# Patient Record
Sex: Female | Born: 1950 | Race: Black or African American | Hispanic: No | Marital: Married | Smoking: Never smoker
Health system: Southern US, Community
[De-identification: ages and names within clinical notes are randomized; demographics above are authoritative.]

## PROBLEM LIST (undated history)

## (undated) DIAGNOSIS — G4761 Periodic limb movement disorder: Secondary | ICD-10-CM

## (undated) DIAGNOSIS — Z972 Presence of dental prosthetic device (complete) (partial): Secondary | ICD-10-CM

## (undated) DIAGNOSIS — K219 Gastro-esophageal reflux disease without esophagitis: Secondary | ICD-10-CM

## (undated) DIAGNOSIS — M1990 Unspecified osteoarthritis, unspecified site: Secondary | ICD-10-CM

## (undated) DIAGNOSIS — I10 Essential (primary) hypertension: Secondary | ICD-10-CM

## (undated) DIAGNOSIS — Z9889 Other specified postprocedural states: Secondary | ICD-10-CM

## (undated) DIAGNOSIS — L930 Discoid lupus erythematosus: Secondary | ICD-10-CM

## (undated) DIAGNOSIS — G4730 Sleep apnea, unspecified: Secondary | ICD-10-CM

## (undated) DIAGNOSIS — G43909 Migraine, unspecified, not intractable, without status migrainosus: Secondary | ICD-10-CM

## (undated) DIAGNOSIS — R569 Unspecified convulsions: Secondary | ICD-10-CM

## (undated) DIAGNOSIS — N6019 Diffuse cystic mastopathy of unspecified breast: Secondary | ICD-10-CM

## (undated) DIAGNOSIS — T7840XA Allergy, unspecified, initial encounter: Secondary | ICD-10-CM

## (undated) DIAGNOSIS — I671 Cerebral aneurysm, nonruptured: Secondary | ICD-10-CM

## (undated) DIAGNOSIS — IMO0002 Reserved for concepts with insufficient information to code with codable children: Secondary | ICD-10-CM

## (undated) DIAGNOSIS — E876 Hypokalemia: Secondary | ICD-10-CM

## (undated) DIAGNOSIS — D496 Neoplasm of unspecified behavior of brain: Secondary | ICD-10-CM

## (undated) HISTORY — DX: Other specified postprocedural states: Z98.890

## (undated) HISTORY — PX: HEMORRHOID SURGERY: SHX153

## (undated) HISTORY — DX: Unspecified convulsions: R56.9

## (undated) HISTORY — PX: TONSILLECTOMY AND ADENOIDECTOMY: SUR1326

## (undated) HISTORY — DX: Allergy, unspecified, initial encounter: T78.40XA

## (undated) HISTORY — DX: Migraine, unspecified, not intractable, without status migrainosus: G43.909

## (undated) HISTORY — PX: FOOT SURGERY: SHX648

## (undated) HISTORY — PX: HERNIA REPAIR: SHX51

## (undated) HISTORY — DX: Periodic limb movement disorder: G47.61

## (undated) HISTORY — PX: BRAIN AVM REPAIR: SHX202

## (undated) HISTORY — DX: Essential (primary) hypertension: I10

## (undated) HISTORY — DX: Sleep apnea, unspecified: G47.30

## (undated) HISTORY — DX: Diffuse cystic mastopathy of unspecified breast: N60.19

## (undated) HISTORY — DX: Reserved for concepts with insufficient information to code with codable children: IMO0002

## (undated) HISTORY — PX: SPINE SURGERY: SHX786

## (undated) HISTORY — DX: Neoplasm of unspecified behavior of brain: D49.6

## (undated) HISTORY — DX: Discoid lupus erythematosus: L93.0

## (undated) HISTORY — PX: DILATION AND CURETTAGE OF UTERUS: SHX78

## (undated) HISTORY — DX: Cerebral aneurysm, nonruptured: I67.1

## (undated) HISTORY — DX: Gastro-esophageal reflux disease without esophagitis: K21.9

## (undated) HISTORY — PX: BREAST SURGERY: SHX581

## (undated) HISTORY — PX: BREAST CYST ASPIRATION: SHX578

## (undated) HISTORY — PX: BREAST EXCISIONAL BIOPSY: SUR124

## (undated) HISTORY — DX: Hypokalemia: E87.6

## (undated) HISTORY — PX: BREAST BIOPSY: SHX20

## (undated) SURGERY — COLONOSCOPY WITH PROPOFOL
Anesthesia: Choice

---

## 1977-01-04 HISTORY — PX: TOTAL ABDOMINAL HYSTERECTOMY: SHX209

## 1998-01-04 HISTORY — PX: OTHER SURGICAL HISTORY: SHX169

## 1998-11-21 ENCOUNTER — Encounter: Payer: Self-pay | Admitting: Neurosurgery

## 1998-11-21 ENCOUNTER — Ambulatory Visit (HOSPITAL_COMMUNITY): Admission: RE | Admit: 1998-11-21 | Discharge: 1998-11-22 | Payer: Self-pay | Admitting: Neurosurgery

## 2001-06-22 DIAGNOSIS — D1802 Hemangioma of intracranial structures: Secondary | ICD-10-CM | POA: Insufficient documentation

## 2001-06-25 DIAGNOSIS — Q283 Other malformations of cerebral vessels: Secondary | ICD-10-CM | POA: Insufficient documentation

## 2001-09-20 ENCOUNTER — Encounter: Payer: Self-pay | Admitting: Neurosurgery

## 2001-09-25 ENCOUNTER — Ambulatory Visit (HOSPITAL_COMMUNITY): Admission: RE | Admit: 2001-09-25 | Discharge: 2001-09-26 | Payer: Self-pay | Admitting: Neurosurgery

## 2001-09-25 ENCOUNTER — Encounter: Payer: Self-pay | Admitting: Neurosurgery

## 2003-02-08 ENCOUNTER — Encounter: Admission: RE | Admit: 2003-02-08 | Discharge: 2003-02-08 | Payer: Self-pay | Admitting: Neurosurgery

## 2003-02-26 ENCOUNTER — Encounter: Admission: RE | Admit: 2003-02-26 | Discharge: 2003-02-26 | Payer: Self-pay | Admitting: Neurosurgery

## 2003-03-20 ENCOUNTER — Encounter: Admission: RE | Admit: 2003-03-20 | Discharge: 2003-03-20 | Payer: Self-pay | Admitting: Neurosurgery

## 2004-02-03 ENCOUNTER — Ambulatory Visit: Payer: Self-pay | Admitting: Surgery

## 2004-02-14 ENCOUNTER — Ambulatory Visit: Payer: Self-pay | Admitting: Surgery

## 2004-03-03 ENCOUNTER — Ambulatory Visit: Payer: Self-pay | Admitting: Surgery

## 2004-05-25 ENCOUNTER — Ambulatory Visit: Payer: Self-pay | Admitting: Surgery

## 2004-06-11 ENCOUNTER — Ambulatory Visit: Payer: Self-pay | Admitting: Surgery

## 2004-11-18 ENCOUNTER — Ambulatory Visit: Payer: Self-pay | Admitting: Surgery

## 2005-02-03 ENCOUNTER — Ambulatory Visit: Payer: Self-pay | Admitting: Surgery

## 2005-12-01 ENCOUNTER — Ambulatory Visit: Payer: Self-pay | Admitting: Gastroenterology

## 2006-03-30 ENCOUNTER — Ambulatory Visit: Payer: Self-pay | Admitting: Surgery

## 2006-05-04 ENCOUNTER — Ambulatory Visit: Payer: Self-pay | Admitting: Neurology

## 2007-03-31 ENCOUNTER — Ambulatory Visit: Payer: Self-pay | Admitting: Surgery

## 2007-06-01 DIAGNOSIS — E876 Hypokalemia: Secondary | ICD-10-CM

## 2007-06-01 HISTORY — DX: Hypokalemia: E87.6

## 2007-10-30 ENCOUNTER — Ambulatory Visit: Payer: Self-pay | Admitting: Family Medicine

## 2007-11-07 ENCOUNTER — Ambulatory Visit: Payer: Self-pay | Admitting: Family Medicine

## 2008-05-14 ENCOUNTER — Ambulatory Visit: Payer: Self-pay | Admitting: Family Medicine

## 2008-10-30 ENCOUNTER — Ambulatory Visit: Payer: Self-pay | Admitting: Surgery

## 2008-11-07 ENCOUNTER — Ambulatory Visit: Payer: Self-pay | Admitting: Surgery

## 2009-03-10 ENCOUNTER — Encounter: Payer: Self-pay | Admitting: Cardiovascular Disease

## 2009-03-20 ENCOUNTER — Ambulatory Visit: Payer: Self-pay | Admitting: Cardiovascular Disease

## 2009-03-20 DIAGNOSIS — R079 Chest pain, unspecified: Secondary | ICD-10-CM | POA: Insufficient documentation

## 2009-03-20 DIAGNOSIS — R7309 Other abnormal glucose: Secondary | ICD-10-CM | POA: Insufficient documentation

## 2009-03-20 DIAGNOSIS — I10 Essential (primary) hypertension: Secondary | ICD-10-CM | POA: Insufficient documentation

## 2009-03-20 DIAGNOSIS — G4769 Other sleep related movement disorders: Secondary | ICD-10-CM | POA: Insufficient documentation

## 2009-04-29 ENCOUNTER — Telehealth: Payer: Self-pay | Admitting: Cardiovascular Disease

## 2009-05-06 ENCOUNTER — Telehealth: Payer: Self-pay | Admitting: Cardiovascular Disease

## 2009-05-09 ENCOUNTER — Ambulatory Visit: Payer: Self-pay | Admitting: Surgery

## 2009-05-15 ENCOUNTER — Encounter: Payer: Self-pay | Admitting: Cardiovascular Disease

## 2009-05-15 ENCOUNTER — Ambulatory Visit: Payer: Self-pay

## 2009-05-20 ENCOUNTER — Encounter: Payer: Self-pay | Admitting: Cardiovascular Disease

## 2009-05-21 ENCOUNTER — Ambulatory Visit: Payer: Self-pay | Admitting: Cardiovascular Disease

## 2009-05-21 DIAGNOSIS — R609 Edema, unspecified: Secondary | ICD-10-CM | POA: Insufficient documentation

## 2009-06-06 ENCOUNTER — Ambulatory Visit: Payer: Self-pay | Admitting: Internal Medicine

## 2009-06-11 ENCOUNTER — Ambulatory Visit: Payer: Self-pay | Admitting: Family Medicine

## 2009-06-16 ENCOUNTER — Telehealth: Payer: Self-pay | Admitting: Cardiovascular Disease

## 2009-07-04 ENCOUNTER — Ambulatory Visit: Payer: Self-pay | Admitting: Cardiovascular Disease

## 2009-07-08 ENCOUNTER — Encounter: Payer: Self-pay | Admitting: Cardiovascular Disease

## 2009-07-08 LAB — CONVERTED CEMR LAB
BUN: 14 mg/dL (ref 6–23)
Chloride: 100 meq/L (ref 96–112)
Potassium: 3.2 meq/L — ABNORMAL LOW (ref 3.5–5.3)
Sodium: 140 meq/L (ref 135–145)

## 2009-07-21 ENCOUNTER — Ambulatory Visit: Payer: Self-pay | Admitting: Cardiovascular Disease

## 2009-07-31 ENCOUNTER — Telehealth: Payer: Self-pay | Admitting: Cardiovascular Disease

## 2009-11-04 ENCOUNTER — Ambulatory Visit: Payer: Self-pay | Admitting: Surgery

## 2010-02-03 NOTE — Miscellaneous (Signed)
Summary: Lasix, potassium  Clinical Lists Changes  Medications: Changed medication from POTASSIUM CHLORIDE CRYS CR 20 MEQ CR-TABS (POTASSIUM CHLORIDE CRYS CR) Take one tablet by mouth daily to POTASSIUM CHLORIDE CRYS CR 20 MEQ CR-TABS (POTASSIUM CHLORIDE CRYS CR) Take two tablets by mouth once daily - Signed Changed medication from FUROSEMIDE 20 MG TABS (FUROSEMIDE) Take 1 tablet by mouth once a day as needed swelling to FUROSEMIDE 40 MG TABS (FUROSEMIDE) Take one tablet by mouth daily. - Signed Rx of POTASSIUM CHLORIDE CRYS CR 20 MEQ CR-TABS (POTASSIUM CHLORIDE CRYS CR) Take two tablets by mouth once daily;  #60 x 6;  Signed;  Entered by: Benedict Needy, RN;  Authorized by: Dossie Arbour MD;  Method [facility]: [phone][facility] [address] [place], [place], [place]  [address], Ph: [phone] or [phone], Fax: [phone] Rx of FUROSEMIDE 40 MG TABS (FUROSEMIDE) Take one tablet by mouth daily.;  #30 x 6;  Signed;  Entered by: Benedict Needy, RN;  Authorized by: Dossie Arbour MD;  Method used: [facility] [phone].*, [address] [place], [place], [place]  [address], Ph: [phone] or [phone], Fax: [phone]    Prescriptions: FUROSEMIDE 40 MG TABS (FUROSEMIDE) Take one tablet by mouth daily.  #30 x 6   Entered by:   Benedict Needy, RN   Authorized by:   Dossie Arbour MD   Signed by:   Benedict Needy, RN on 07/08/2009   Method used:   [facility]        [facility]  [address] [phone]ail[facility]       [address]       [place], [place]  [address]       Ph: [phone] or [phone]       Fax: [phone]   RxID:   [id number] POTASSIUM CHLORIDE CRYS CR 20 MEQ CR-TABS (POTASSIUM CHLORIDE CRYS CR) Take two tablets by mouth once daily  #60 x 6   Entered by:   Benedict Needy, RN   Authorized by:   Dossie Arbour MD   Signed by:   Benedict Needy, RN on 07/08/2009   Method used:   [facility]        [facility]  [address] [phone].*  (retail)       [address]       [place], [place]  [address]       Ph: [phone] or [phone]       Fax: [phone]   RxID:   [id number]

## 2010-02-03 NOTE — Progress Notes (Signed)
Summary: EDEMA  Phone Note Call from Patient Call back at [phone]   Caller: SELF Call For: [facility] Summary of Call: PT IS HAVING SWELLING IN FEET AND WOULD LIKE A CALL BACK Initial call taken by: Harlon Flor,  May 06, 2009 8:07 AM  Follow-up for Phone Call        Called spoke with pt.  Pt states she is still experiencing B leg swelling throughout day worsens, better with elevation through the night.  Pt states she feels bloted.  Pt states CP is better, since last week.  Not having incr SOB.  Saw primary MD last week,  gave PPI for stomach without relief.  Wants to know if Dr Mariah Milling would like to see pt?   Follow-up by: Cloyde Reams RN,  May 06, 2009 10:32 AM

## 2010-02-03 NOTE — Assessment & Plan Note (Signed)
Summary: f/u after ECHO/Edema/ewj   Visit Type:  Follow-up Primary Provider:  Nilda Simmer  CC:  FEELS LIKE SHE IS RETAINING FLUID/ ONCE IN A WHILE HAVING SOME CHEST PAINS/ SOME SHORTNESS OF BREATH/ EDEMA IN ANKLES AND FEET EXPECIALLY IN THE EVENING.[name]  History of Present Illness: Ms. Bacha is a 60 her old woman with a past medical history of prediabetes, hypertension, migraines who has otherwise been in good state of health who presents via referral from Dr. Katrinka Blazing at [facility] family practice for evaluation after recent episodes of chest tightness.  She states that she has had some days of lower extremity edema. They typically, the end of the day after she's been on her feet. She feels that she is retaining fluid and her HCTZ pill was not helping her to any degree. 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  She continues to have very occasional episode chest pain no typically at rest. She has not been exercising still appeared.  Echo done in the past several weeks shows normal systolic function, no elevated right ventricular systolic pressure, no significant valvular disease.  She is active at baseline, has no symptoms with exertion. She is limited to some degree by some lower extremity discomfort though states that she is able to go shopping and do her ADLs without any problems. She does not participate in a regular exercise program. She has been active since her episode of chest pain and has had no further episodes. She denies any belching or burping that might have relieved her discomfort. She is a nonsmoker, has a history of diabetes though no active diabetes currently.  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  Preventive Screening-Counseling & Management  Alcohol-Tobacco     Smoking Status: never  Caffeine-Diet-Exercise     Does Patient Exercise:  yes      Drug Use:  no.    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 1 Tab Daily 2)  Nortriptyline Hcl 25 Mg Caps (Nortriptyline Hcl) .... 2 Tabs At Bedtime 3)  Gabapentin 800 Mg Tabs (Gabapentin) .[name].. 1 Tab By Mouth 4 Times A Day 4)  Hydroxychloroquine Sulfate 200 Mg Tabs (Hydroxychloroquine Sulfate) .[name].. 1 Tab Two Times A Day 5)  Lortab 7.5-500 Mg Tabs (Hydrocodone-Acetaminophen) .[name].. 1 Tab Every 12 Hours 6)  Potassium Chloride Crys Cr 20 Meq Cr-Tabs (Potassium Chloride Crys Cr) .... Take One Tablet By Mouth Daily 7)  Triamterene-Hctz 37.5-25 Mg Tabs (Triamterene-Hctz) .[name].. 1 Tab By Mouth Daily 8)  Mirapex 0.125 Mg Tabs (Pramipexole Dihydrochloride) .[name].. 1 Tab At Bedtime For Restless Leg Syndrome 9)  Atenolol 25 Mg Tabs (Atenolol) .[name].. 1 Tab Every Morning and 2 At Bedtime 10)  Nitrostat 0.4 Mg Subl (Nitroglycerin) .[name].. 1 Tablet Under Tongue At Onset of Chest Pain; You May Repeat Every 5 Minutes For Up To 3 Doses.  Allergies (verified): 1)  ! * Lisinopril  Past History:  Past Surgical History: Abdominal Hysterectomy-Total 5 d&c HEMORHIOD SURGERY FIBROCYSIC DISEASE 5 SURGERIES  ON BREAST FOOT SURGERY  Family History: Father: deceased 63: killed Mother: deceased 28: heart problems, CHF, HTN Family History of Coronary Artery Disease:  Family History  of Cancer:   Social History: Retired  Married  Tobacco Use - No.  Alcohol Use - no Regular Exercise - yes Drug Use - no Does Patient Exercise:  yes Drug Use:  no  Review of Systems       The patient complains of weight gain and peripheral edema.  The patient denies fever, weight loss, vision loss, decreased hearing, hoarseness, chest pain, syncope, dyspnea on exertion, prolonged  cough, abdominal pain, incontinence, muscle weakness, depression, and enlarged lymph nodes.    Vital Signs:  Patient profile:   60 year old female Height:      62 inches Weight:      186.75 pounds BMI:     34.28 Pulse rate:   72 / minute Pulse rhythm:   regular BP sitting:   110 / 80  (left arm) Cuff size:   large  Vitals Entered By: Mercer Pod (May 21, 2009 11:11 AM)  Physical Exam  General:  well-appearing woman with obesity in no apparent distress, HEENT skin is benign, or fax is clear, neck is supple with no JVP or carotid bruits, heart sounds are regular with S1-S2 and no murmurs appreciated, lungs are clear to auscultation with no wheezes Rales, abdominal exam is benign, no significant lower extremity edema, neurologic exam is nonfocal, skin is warm and dry couple's are equal and symmetrical in her upper and lower extremities.   Impression & Recommendations:  Problem # 1:  EDEMA (ICD-782.3) etiology of her edema is likely due to venous insufficiency. She does not have an elevated right ventricular systolic pressure on echo. She does have some indication of diastolic dysfunction.  We will her a prescription for furosemide 20 mg to be taken on an as-needed basis when she does have significant ankle swelling. If this does not seem to help, suggested she try TED hose.  I suggested it is okay if she needs to take prednisone for her lupus.  Problem # 2:  CHEST PAIN-UNSPECIFIED (ICD-786.50) Her symptoms of chest pain again seem somewhat atypical I have asked her to keep [place] posted if her symptoms get worse and with exertion. She did have a stress test 2 years ago that was normal.  Will try to obtain her most recent cholesterol panel from [name]  Her updated medication list for this problem includes:    Atenolol 25 Mg Tabs (Atenolol) .[name]... 1 tab every morning and 2 at bedtime    Nitrostat 0.4 Mg Subl (Nitroglycerin) .[name]... 1 tablet under tongue at onset of chest  pain; you may repeat every 5 minutes for up to 3 doses.  Appended Document: f/u after ECHO/Edema/ewj    Clinical Lists Changes  Medications: Added new medication of FUROSEMIDE 20 MG TABS (FUROSEMIDE) Take 1 tablet by mouth once a day as needed swelling - Signed Rx of FUROSEMIDE 20 MG TABS (FUROSEMIDE) Take 1 tablet by mouth once a day as needed swelling;  #30 x 3;  Signed;  Entered by: Cloyde Reams RN;  Authorized by: Dossie Arbour MD;  Method used: [facility] [phone].*, [address], [place], [place], [place]  [address], Ph: [phone] or [phone], Fax: [phone]    Prescriptions: FUROSEMIDE 20 MG TABS (FUROSEMIDE) Take 1 tablet by mouth once a day as needed swelling  #30 x 3   Entered by:   Cloyde Reams RN   Authorized by:   Dossie Arbour MD   Signed by:   Cloyde Reams RN on 05/22/2009  Method used:   [facility]        [facility]  [address] [phone][facility] (retail)       [address]       [place], [place]  [address]       Ph: [phone] or [phone]       Fax: [phone]   RxID:   [id number]

## 2010-02-03 NOTE — Assessment & Plan Note (Signed)
Summary: NP6/AMD   Visit Type:  Initial Consult Primary Provider:  Nilda Simmer  CC:  tightness in chest.  History of Present Illness: Maureen Hodge is a 42 her old woman with a past medical history of prediabetes, hypertension, migraines who has otherwise been in good state of health who presents via referral from Dr. Katrinka Blazing at [facility] family practice for evaluation after recent episodes of chest tightness.  She states that 1-1/2 weeks ago, she developed chest pain while sitting on the couch. She described it as a tightness lasting for 30-45 minutes with a 7-8/10 in intensity. She almost went to the emergency room and then it seemed to ease off without any further intervention. She has had episodes like this in the past that has led to stress test. She had a stress test in May 2009 and also a stress test at [facility] several years prior to that. She was told that both of these stress tests were negative and her pain was likely noncardiac.  She is active at baseline, has no symptoms with exertion. She is limited to some degree by some lower extremity discomfort though states that she is able to go shopping and do her ADLs without any problems. She does not participate in a regular exercise program. She has been active since her episode of chest pain and has had no further episodes. She denies any belching or burping that might have relieved her discomfort. She is a nonsmoker, has a history of diabetes though no active diabetes currently.  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  Preventive Screening-Counseling & Management  Alcohol-Tobacco     Alcohol drinks/day: 0     Smoking Status: never  Caffeine-Diet-Exercise     Does Patient Exercise: no  Allergies (verified): 1)  ! * Lisinopril  Past History:  Past Medical History: migraines HTN limb disorder movement sleep related pre-diabetes  Social History: Alcohol  drinks/day:  0 Smoking Status:  never Does Patient Exercise:  no  Review of Systems       The patient complains of chest pain.  The patient denies fever, weight loss, weight gain, vision loss, decreased hearing, hoarseness, syncope, dyspnea on exertion, peripheral edema, prolonged cough, abdominal pain, incontinence, muscle weakness, depression, and enlarged lymph nodes.    Physical Exam  General:  well-appearing middle-aged woman in no apparent distress, HEENT exam is benign, neck is supple with no JVP or carotid bruits, heart sounds are regular with S1-S2 no murmurs appreciated, lungs are clear to auscultation with no wheezes or rales, abdominal exam is benign, no significant lower extremity edema, neurologic exam is grossly nonfocal, skin is warm and dry. Pulses are equal and symmetrical in her upper and lower extremities.    EKG  Procedure date:  03/10/2009  Findings:      EKG done at Dr. Michaelle Copas office shows normal sinus rhythm with rate of 73 beats per minute, no significant ST or T wave changes.  Impression & Recommendations:  Problem # 1:  CHEST PAIN-UNSPECIFIED (ICD-786.50) Etiology of her chest pain is uncertain. She has few risk factors for coronary disease. She has had chest pain previously with negative stress test in 5/09. She is currently active with no further chest pain. We did suggest to her that we could monitor her symptoms at this time if she has further episodes, order a repeat stress test to rule out ischemia. If she is  concerned about her symptoms, we could easily order the stress test now. She preferred to hold off and to see how her symptoms progressed. I told her to start exercising with walking on a track near her house. If She does have any symptoms with exertion, I asked her to call me.  Her symptoms may be gastrointestinal and we will call in nitroglycerin to take p.r.n. if her symptoms are severe. This could help with esophageal spasm as well as coronary  spasm. If She has to take a nitroglycerin, I have asked her to contact our office.   Her updated medication list for this problem includes:    Atenolol 25 Mg Tabs (Atenolol) .[name]... 1 tab every morning and 2 at bedtime    Nitrostat 0.4 Mg Subl (Nitroglycerin) .[name]... 1 tablet under tongue at onset of chest pain; you may repeat every 5 minutes for up to 3 doses.  Problem # 2:  HYPERTENSION, BENIGN (ICD-401.1) blood pressure is borderline elevated on today's visit and we have suggested that she continue to monitor this at home. We have made no medication changes.  Her updated medication list for this problem includes:    Triamterene-hctz 37.5-25 Mg Tabs (Triamterene-hctz) .[name]... 1 tab by mouth daily    Atenolol 25 Mg Tabs (Atenolol) .[name]... 1 tab every morning and 2 at bedtime  Patient Instructions: 1)  Your physician recommends that you schedule a follow-up appointment in: 3 months 2)  Your physician has recommended you make the following change in your medication: nitroglycerin sublingual as needed for chest pain Prescriptions: NITROSTAT 0.4 MG SUBL (NITROGLYCERIN) 1 tablet under tongue at onset of chest pain; you may repeat every 5 minutes for up to 3 doses.  #25 x 1   Entered by:   Charlena Cross, RN, BSN   Authorized by:   Dossie Arbour MD   Signed by:   Charlena Cross, RN, BSN on 03/20/2009   Method used:   [facility]        [facility]  [address] [phone][facility] (retail[facility]       [address]       [place], [place]  [address]       Ph: [phone] or [phone]       Fax: [phone]   RxID:   [id number]

## 2010-02-03 NOTE — Progress Notes (Signed)
Summary: edema  Phone Note Call from Patient   Caller: Patient Reason for Call: Talk to Nurse Summary of Call: Pt is having edema in both legs and is up over 10lbs. She tried TED hose for 3 days with no reduction, she also tried to keep her legs elavated as much as possible. Initial call taken by: Hardin Negus, RMA,  June 16, 2009 4:36 PM  Follow-up for Phone Call        spoke with Dr Excell Seltzer, who is in the office today, he said to tell her to take an extra lasix today and start 40mg  tomorrow along with and extra potassium. .... pt aware Follow-up by: Hardin Negus, RMA,  June 16, 2009 4:39 PM     Appended Document: edema If she is taking lasix daily, we should check her BMP in 1 to 2 weeks. ECHO did not suggest alot of fluid. Edema is likely from veins in legs.   Appended Document: edema LMOM TCB  Appended Document: edema Spoke to pt scheduled lab appointment for 7/1 at 8:45.

## 2010-02-03 NOTE — Progress Notes (Signed)
Summary: PHI  PHI   Imported By: Harlon Flor 03/24/2009 15:40:26  _____________________________________________________________________  External Attachment:    Type:   Image     Comment:   External Document

## 2010-02-03 NOTE — Progress Notes (Signed)
Summary: EDEMA  Phone Note Call from Patient Call back at Home Phone [phone]   Caller: SELF Call For: GOLLAN Summary of Call: ANKLES HAVE BEEN SWELLING-GO BACK DOWN AFTER BEING PROPPED UP-WENT TO THE PRIMARY DR THIS MORNING AND WAS TOLD TO CALL HERE AND TO LET Mariah Milling KNOW BECAUSE THE PT MAY NEED A STRESS TEST Initial call taken by: Harlon Flor,  April 29, 2009 1:38 PM  Follow-up for Phone Call        spoke with pt and pt is having intermittent chest pressure in conjunction with swelling.  pt was evaluated by Dr. Michaelle Copas PA and was sent hom with NTG.  Instructed pt that I would forward this message to Dr. Mariah Milling and that if she has active chest pain she needs to seek medical attention rather than waiting to see PCP.  Pt understands.  Follow-up by: Charlena Cross, RN, BSN,  April 29, 2009 2:12 PM  Additional Follow-up for Phone Call Additional follow up Details #1::        Patient called again this morning and reported swelling still in legs and feet with a increase of 4 pounds.  Patient would like to be seen by Dr. Mariah Milling asap.    Additional Follow-up for Phone Call Additional follow up Details #2::    Would order echo, either [place] or [place] to evaluate cardiac function, ?pulmonary HTn. edema. Can see her after echo done.   Reduce  salt and fluid intake. Monitor BP   Appended Document: EDEMA Attempted TCB and notify pt.  LMOM TCB. EWJ  Appended Document: EDEMA Spoke with pt ECHO scheduled for 05/15/09 and f/u with Dr Mariah Milling 05/21/09.  EWJ

## 2010-02-03 NOTE — Progress Notes (Signed)
Summary: SWELLING  Phone Note Call from Patient Call back at [facility] Phone [phone]   Caller: Patient Summary of Call: PATIENT IS HAVING EDEMA IN LEGS AND ANKLES WOULD LIKE FOR RN TO CALL HER ON HER CELL PHONE [phone] Initial call taken by: [place],  July 31, 2009 9:48 AM  Follow-up for Phone Call        pt is wearing her TEDs and taking lasix the edema in LE is not improving.  Reports that she has decreased her salt intake and does not drink soda. Please advise.  Follow-up by: Benedict Needy, RN,  July 31, 2009 11:25 AM  Additional Follow-up for Phone Call Additional follow up Details #1::        If the lasix and TED hose is not helping, it is likely from leaky veins. She can do nothing and wear TED hose, or can see a vein specialist, possibly one of the vein docs in [place] or [place]. they can do a "vein mapping"      Appended Document: SWELLING LMOM TCB   Appended Document: SWELLING lmom tcb  Appended Document: SWELLING going on vacation next satuday best # to reach her [phone]  would like an appointment next week.   Appended Document: SWELLING pt aware of appointment aug 30th pt given [facility] address and phone #. Demographics, office note and recent labs faxed

## 2010-05-22 NOTE — Op Note (Signed)
NAMELECHELLE, WRIGLEY                        ACCOUNT NO.:  [id number]   MEDICAL RECORD NO.:  [id number]                   PATIENT TYPE:  OIB   LOCATION:  3012                                 FACILITY:  [facility]   PHYSICIAN:  Kathaleen Maser. Pool, M.D.                 DATE OF BIRTH:  August 31, 1950   DATE OF PROCEDURE:  09/25/2001  DATE OF DISCHARGE:                                 OPERATIVE REPORT   PREOPERATIVE DIAGNOSIS:  Left L5-S1 foraminal stenosis with radiculopathy.   POSTOPERATIVE DIAGNOSIS:  Left L5-S1 foraminal stenosis with radiculopathy.   PROCEDURE:  Left L5-S1 decompressive laminotomy with foraminotomy.   SURGEON:  Kathaleen Maser. Pool, M.D.   ASSISTANT:  Reinaldo Meeker, M.D.   ANESTHESIA:  General endotracheal.   INDICATIONS:  The patient is a 60 year old female with history of a previous  lumbar spinal puncture with the post-procedure symptoms of chronic  radiculopathy involving her left-sided L5 nerve root.  The patient has  failed all efforts at conservative management.  Workup along the way has  included an MRI scan of her lumbar spine, which demonstrates significantly  abnormal anatomy on the left side at the L5-S1 level.  The patient has  marked facet hypertrophy with severe foraminal stenosis causing marked  compression of the exiting L5 nerve root.  We have discussed options  available for management, including the possibility of undergoing a left-  sided L5-S1 decompressive laminotomy and foraminotomy in hopes of improving  her symptoms.  The patient is well aware that this surgery may not give her  any pain relief whatsoever and that she may have a permanent nerve injury  for which there is nothing that can be done.  However, given the significant  structural abnormality, I think it is reasonable to try decompression as a  last ditch effort to help ease some of her pain.  The patient is aware of  the risks and benefits and wishes to proceed.   DESCRIPTION OF  PROCEDURE:  The patient was taken to the operating room and  placed on the operating table in a supine position.  After an adequate level  of anesthesia was achieved, the patient was positioned prone onto the Wilson  frame and properly padded.  The patient's lumbar region is prepped and  draped sterilely.  A 10 blade is used to make a linear skin incision  overlying the L5-S1 interspace.  This is carried down sharply in the  midline.  A subperiosteal dissection is performed on the left side, exposing  what was felt to be L5 and S1 on the left.  An x-ray was taken.  In fact,  the L4-5 level had been exposed.  Decompression then proceeded one  interspace lower.  It should be noted that the interspace at L5-S1 on the  left was markedly abnormal.  There was no interlaminar space to speak of.  The facet was very  hypertrophic and abnormally formed.  A complete left-  sided L5 laminectomy and wide facetectomy was then performed using the high-  speed drill and Kerrison rongeurs.  Ligamentum flavum was then elevated and  resected in piecemeal fashion using the Kerrison rongeurs.  Underlying  thecal sac and exiting L5 and S1 nerve roots were identified.  Both nerve  roots were widely decompressed.  An extremely wide decompression then  performed using the microscope for microdissection along the left-sided L5  nerve root.  The facet joint was undercut, as was the ligament, and the  nerve root itself was followed out into the extraforaminal space.  There was  no evidence of injury to the nerve root.  A blunt probe passed easily along  the course of the nerve root.  There was some degree of a pseudo-disk bulge  but no frank disk herniation requiring treatment.  At this point a blunt  probe was passed easily along the course of the exiting L5 and S1 nerve  roots.  The wound was then irrigated with antibiotic solution.  Gelfoam was  placed topically for hemostasis.  The microscope was removed.   Hemostasis in  the muscles was achieved with electrocautery.  The wound was then closed in  layers with Vicryl sutures.  Steri-Strips and sterile dressing were applied.  There were no apparent complications.  The patient tolerated the procedure  well, and she returns to the recovery room postop.                                               Henry A. Pool, M.D.    HAP/MEDQ  D:  09/25/2001  T:  09/26/2001  Job:  [id number]

## 2010-06-10 ENCOUNTER — Ambulatory Visit: Payer: Self-pay | Admitting: Cardiovascular Disease

## 2010-06-12 ENCOUNTER — Encounter: Payer: Self-pay | Admitting: Cardiovascular Disease

## 2010-06-18 ENCOUNTER — Ambulatory Visit: Payer: Self-pay | Admitting: Cardiovascular Disease

## 2010-06-29 ENCOUNTER — Ambulatory Visit: Payer: Self-pay | Admitting: Cardiovascular Disease

## 2010-07-02 ENCOUNTER — Ambulatory Visit (INDEPENDENT_AMBULATORY_CARE_PROVIDER_SITE_OTHER): Payer: 59 | Admitting: Cardiovascular Disease

## 2010-07-02 ENCOUNTER — Encounter: Payer: Self-pay | Admitting: Cardiovascular Disease

## 2010-07-02 DIAGNOSIS — R079 Chest pain, unspecified: Secondary | ICD-10-CM

## 2010-07-02 DIAGNOSIS — R609 Edema, unspecified: Secondary | ICD-10-CM

## 2010-07-02 DIAGNOSIS — I10 Essential (primary) hypertension: Secondary | ICD-10-CM

## 2010-07-02 NOTE — Assessment & Plan Note (Signed)
No episodes of chest pain recently. No further workup at this time. We have encouraged her to continue her exercise

## 2010-07-02 NOTE — Assessment & Plan Note (Signed)
No significant edema on today's visit. She does wear TED hose and leg elevation when she does develop edema

## 2010-07-02 NOTE — Patient Instructions (Signed)
You are doing well. No medication changes were made. Please call us if you have new issues that need to be addressed before your next appt.  We will call you for a follow up Appt. In 12 months  

## 2010-07-02 NOTE — Assessment & Plan Note (Signed)
Blood pressure is well controlled on today's visit. No changes made to the medications. 

## 2010-07-02 NOTE — Progress Notes (Signed)
   Patient ID: Maureen Hodge, female    DOB: Dec 22, 1950, 60 y.o.   MRN: [id number]  HPI Comments: Maureen Hodge is a 60 her old woman with a past medical history of prediabetes, hypertension, migraines who has otherwise been in good state of health, previous history of chest tightness who presents for routine followup. She is a patient of Maureen Hodge.   Overall, she reports that she is doing well. She denies any significant chest pain, no edema. She does have edema in her ankles, she wears TED hose and leg elevation. She reports that her blood pressure has been elevated at times and she is watching it at home at the request of Maureen Hodge.   Echo Hodge in the past  shows normal systolic function, no elevated right ventricular systolic pressure, no significant valvular disease.   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  Total cholesterol 151, LDL 62, HDL 66 last year  she does report her mother had coronary artery disease  EKG shows normal sinus rhythm with rate 63 beats per minute with no significant ST or T wave changes      Review of Systems  Constitutional: Negative.   HENT: Negative.   Eyes: Negative.   Respiratory: Negative.   Cardiovascular: Positive for leg swelling.  Gastrointestinal: Negative.   Musculoskeletal: Negative.   Skin: Negative.   Neurological: Negative.   Hematological: Negative.   Psychiatric/Behavioral: Negative.   All other systems reviewed and are negative.    BP 119/81  Pulse 62  Ht 5\' 1"  (1.549 m)  Wt 179 lb (81.194 kg)  BMI 33.82 kg/m2  Physical Exam  Nursing note and vitals reviewed. Constitutional: She is oriented to person, place, and time. She appears well-developed and well-nourished.  HENT:  Head: Normocephalic.  Nose: Nose normal.  Mouth/Throat: Oropharynx is clear and moist.  Eyes: Conjunctivae are normal. Pupils are equal, round, and reactive to light.  Neck:  Normal range of motion. Neck supple. No JVD present.  Cardiovascular: Normal rate, regular rhythm, S1 normal, S2 normal, normal heart sounds and intact distal pulses.  Exam reveals no gallop and no friction rub.   No murmur heard. Pulmonary/Chest: Effort normal and breath sounds normal. No respiratory distress. She has no wheezes. She has no rales. She exhibits no tenderness.  Abdominal: Soft. Bowel sounds are normal. She exhibits no distension. There is no tenderness.  Musculoskeletal: Normal range of motion. She exhibits no edema and no tenderness.  Lymphadenopathy:    She has no cervical adenopathy.  Neurological: She is alert and oriented to person, place, and time. Coordination normal.  Skin: Skin is warm and dry. No rash noted. No erythema.  Psychiatric: She has a normal mood and affect. Her behavior is normal. Judgment and thought content normal.         Assessment and Plan

## 2010-09-08 ENCOUNTER — Ambulatory Visit: Payer: Self-pay | Admitting: Surgery

## 2011-01-05 HISTORY — PX: BRAIN SURGERY: SHX531

## 2011-01-12 DIAGNOSIS — L930 Discoid lupus erythematosus: Secondary | ICD-10-CM | POA: Diagnosis not present

## 2011-01-12 DIAGNOSIS — K219 Gastro-esophageal reflux disease without esophagitis: Secondary | ICD-10-CM | POA: Diagnosis not present

## 2011-01-12 DIAGNOSIS — R7309 Other abnormal glucose: Secondary | ICD-10-CM | POA: Diagnosis not present

## 2011-01-12 DIAGNOSIS — I10 Essential (primary) hypertension: Secondary | ICD-10-CM | POA: Diagnosis not present

## 2011-01-14 ENCOUNTER — Ambulatory Visit: Payer: Self-pay | Admitting: Surgery

## 2011-02-19 DIAGNOSIS — N63 Unspecified lump in unspecified breast: Secondary | ICD-10-CM | POA: Diagnosis not present

## 2011-04-11 DIAGNOSIS — I10 Essential (primary) hypertension: Secondary | ICD-10-CM | POA: Diagnosis not present

## 2011-04-11 DIAGNOSIS — R519 Headache, unspecified: Secondary | ICD-10-CM | POA: Insufficient documentation

## 2011-04-11 DIAGNOSIS — Z79899 Other long term (current) drug therapy: Secondary | ICD-10-CM | POA: Diagnosis not present

## 2011-04-11 DIAGNOSIS — R51 Headache: Secondary | ICD-10-CM | POA: Diagnosis not present

## 2011-04-11 DIAGNOSIS — D320 Benign neoplasm of cerebral meninges: Secondary | ICD-10-CM | POA: Diagnosis not present

## 2011-04-11 DIAGNOSIS — Z5181 Encounter for therapeutic drug level monitoring: Secondary | ICD-10-CM | POA: Diagnosis not present

## 2011-04-20 DIAGNOSIS — D320 Benign neoplasm of cerebral meninges: Secondary | ICD-10-CM | POA: Diagnosis not present

## 2011-04-21 DIAGNOSIS — C6960 Malignant neoplasm of unspecified orbit: Secondary | ICD-10-CM | POA: Diagnosis not present

## 2011-04-21 DIAGNOSIS — H269 Unspecified cataract: Secondary | ICD-10-CM | POA: Diagnosis not present

## 2011-05-05 DIAGNOSIS — D496 Neoplasm of unspecified behavior of brain: Secondary | ICD-10-CM

## 2011-05-05 HISTORY — DX: Neoplasm of unspecified behavior of brain: D49.6

## 2011-05-18 DIAGNOSIS — D320 Benign neoplasm of cerebral meninges: Secondary | ICD-10-CM | POA: Diagnosis not present

## 2011-05-25 DIAGNOSIS — K219 Gastro-esophageal reflux disease without esophagitis: Secondary | ICD-10-CM | POA: Diagnosis present

## 2011-05-25 DIAGNOSIS — Z6832 Body mass index (BMI) 32.0-32.9, adult: Secondary | ICD-10-CM | POA: Diagnosis not present

## 2011-05-25 DIAGNOSIS — G9340 Encephalopathy, unspecified: Secondary | ICD-10-CM | POA: Diagnosis not present

## 2011-05-25 DIAGNOSIS — L930 Discoid lupus erythematosus: Secondary | ICD-10-CM | POA: Diagnosis present

## 2011-05-25 DIAGNOSIS — D496 Neoplasm of unspecified behavior of brain: Secondary | ICD-10-CM | POA: Diagnosis not present

## 2011-05-25 DIAGNOSIS — G936 Cerebral edema: Secondary | ICD-10-CM | POA: Diagnosis not present

## 2011-05-25 DIAGNOSIS — I10 Essential (primary) hypertension: Secondary | ICD-10-CM | POA: Diagnosis present

## 2011-05-25 DIAGNOSIS — H539 Unspecified visual disturbance: Secondary | ICD-10-CM | POA: Diagnosis present

## 2011-05-25 DIAGNOSIS — G4730 Sleep apnea, unspecified: Secondary | ICD-10-CM | POA: Diagnosis present

## 2011-05-25 DIAGNOSIS — G9349 Other encephalopathy: Secondary | ICD-10-CM | POA: Diagnosis not present

## 2011-05-25 DIAGNOSIS — D320 Benign neoplasm of cerebral meninges: Secondary | ICD-10-CM | POA: Diagnosis present

## 2011-05-25 DIAGNOSIS — Z4682 Encounter for fitting and adjustment of non-vascular catheter: Secondary | ICD-10-CM | POA: Diagnosis not present

## 2011-05-25 DIAGNOSIS — C719 Malignant neoplasm of brain, unspecified: Secondary | ICD-10-CM | POA: Diagnosis not present

## 2011-05-25 DIAGNOSIS — R569 Unspecified convulsions: Secondary | ICD-10-CM | POA: Diagnosis not present

## 2011-05-25 DIAGNOSIS — E669 Obesity, unspecified: Secondary | ICD-10-CM | POA: Diagnosis present

## 2011-05-25 DIAGNOSIS — Z01818 Encounter for other preprocedural examination: Secondary | ICD-10-CM | POA: Diagnosis not present

## 2011-06-01 DIAGNOSIS — E669 Obesity, unspecified: Secondary | ICD-10-CM | POA: Insufficient documentation

## 2011-06-01 DIAGNOSIS — D320 Benign neoplasm of cerebral meninges: Secondary | ICD-10-CM | POA: Insufficient documentation

## 2011-07-06 DIAGNOSIS — L930 Discoid lupus erythematosus: Secondary | ICD-10-CM | POA: Diagnosis not present

## 2011-07-06 DIAGNOSIS — D320 Benign neoplasm of cerebral meninges: Secondary | ICD-10-CM | POA: Diagnosis not present

## 2011-07-06 DIAGNOSIS — M329 Systemic lupus erythematosus, unspecified: Secondary | ICD-10-CM | POA: Diagnosis not present

## 2011-07-14 DIAGNOSIS — I10 Essential (primary) hypertension: Secondary | ICD-10-CM | POA: Diagnosis not present

## 2011-07-14 DIAGNOSIS — K219 Gastro-esophageal reflux disease without esophagitis: Secondary | ICD-10-CM | POA: Diagnosis not present

## 2011-07-14 DIAGNOSIS — R7309 Other abnormal glucose: Secondary | ICD-10-CM | POA: Diagnosis not present

## 2011-07-24 ENCOUNTER — Ambulatory Visit: Payer: Self-pay | Admitting: Family Medicine

## 2011-07-24 DIAGNOSIS — K219 Gastro-esophageal reflux disease without esophagitis: Secondary | ICD-10-CM | POA: Diagnosis not present

## 2011-07-24 DIAGNOSIS — J40 Bronchitis, not specified as acute or chronic: Secondary | ICD-10-CM | POA: Diagnosis not present

## 2011-07-24 DIAGNOSIS — E876 Hypokalemia: Secondary | ICD-10-CM | POA: Diagnosis not present

## 2011-07-29 DIAGNOSIS — J209 Acute bronchitis, unspecified: Secondary | ICD-10-CM | POA: Diagnosis not present

## 2011-07-29 DIAGNOSIS — Z780 Asymptomatic menopausal state: Secondary | ICD-10-CM | POA: Diagnosis not present

## 2011-07-29 DIAGNOSIS — I10 Essential (primary) hypertension: Secondary | ICD-10-CM | POA: Diagnosis not present

## 2011-07-29 DIAGNOSIS — E876 Hypokalemia: Secondary | ICD-10-CM | POA: Diagnosis not present

## 2011-08-17 ENCOUNTER — Ambulatory Visit (INDEPENDENT_AMBULATORY_CARE_PROVIDER_SITE_OTHER): Payer: 59 | Admitting: Cardiovascular Disease

## 2011-08-17 ENCOUNTER — Encounter: Payer: Self-pay | Admitting: Cardiovascular Disease

## 2011-08-17 VITALS — BP 120/90 | HR 79 | Ht 61.0 in | Wt 179.5 lb

## 2011-08-17 DIAGNOSIS — I10 Essential (primary) hypertension: Secondary | ICD-10-CM | POA: Diagnosis not present

## 2011-08-17 DIAGNOSIS — R609 Edema, unspecified: Secondary | ICD-10-CM | POA: Diagnosis not present

## 2011-08-17 DIAGNOSIS — R7981 Abnormal blood-gas level: Secondary | ICD-10-CM | POA: Insufficient documentation

## 2011-08-17 NOTE — Progress Notes (Signed)
Patient ID: Maureen Hodge, female    DOB: 1950-01-07, 61 y.o.   MRN: [id number]  HPI Comments: Maureen Hodge is a 61 her old woman with a past medical history of prediabetes, hypertension, migraines who has otherwise been in good state of health, previous history of chest tightness who presents for routine followup. She is a patient of [name].   She has had recent brain surgery for a tumor. [name] at [facility]. Overall , feels tired. No SOB. Also recovering from bronchitis. She had two coarses of ABX. Seen at [facility]. No SOB through this periods. She reports oxygen sat in the 70s at urgent care.  Also with hand swelling, orange discoloration on her feet Overall, she reports that she is doing well.    Echo done in the past  shows normal systolic function, no elevated right ventricular systolic pressure, no significant valvular disease.   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  Total cholesterol 151, LDL 62, HDL 66 last year  she does report her mother had coronary artery disease  EKG shows normal sinus rhythm with rate 79 beats per minute with no significant ST or T wave changes Ambulatory pulse oximetry in our office was challenging given her thick fingernails with nail polish. We periodically measured oxygen saturations in the high 90s, occasional 70s but with readjusting of the pulse oximeter, instantly her oxygen saturation was restored to the high 90s.   Outpatient Encounter Prescriptions as of 08/17/2011  Medication Sig Dispense Refill  . atenolol (TENORMIN) 25 MG tablet Take 25 mg by mouth as directed. Take 1 tablet every morning and 2 tablets at bedtime.       [name] esomeprazole (NEXIUM) 40 MG capsule Take 40 mg by mouth daily before breakfast.        . furosemide (LASIX) 40 MG tablet Take 40 mg by mouth daily.        [name] gabapentin (NEURONTIN) 800 MG tablet  Take 800 mg by mouth 4 (four) times daily.        [name] HYDROcodone-acetaminophen (LORTAB) 7.5-500 MG per tablet Take 1 tablet by mouth every 6 (six) hours as needed.        . hydroxychloroquine (PLAQUENIL) 200 MG tablet Take 200 mg by mouth 2 (two) times daily.        . nitroGLYCERIN (NITROSTAT) 0.4 MG SL tablet Place 0.4 mg under the tongue every 5 (five) minutes as needed.        . nortriptyline (PAMELOR) 75 MG capsule Take 75 mg by mouth at bedtime.      . potassium chloride SA (K-DUR,KLOR-CON) 20 MEQ tablet Takes 2 tablets twice daily.      . pramipexole (MIRAPEX) 0.125 MG tablet Take 0.125 mg by mouth 3 (three) times daily.        [name] triamterene-hydrochlorothiazide (MAXZIDE-25) 37.5-25 MG per tablet Take 1 tablet by mouth daily.        [name] DISCONTD: nortriptyline (PAMELOR) 25 MG capsule Take 50 mg by mouth at bedtime.           Review of Systems  Constitutional: Positive for fatigue.       Drop in her oxygen level  HENT: Negative.   Eyes: Negative.   Respiratory: Negative.   Gastrointestinal: Negative.   Musculoskeletal: Negative.   Skin: Negative.        Orange discoloration  of the soles of her feet  Neurological: Negative.   Hematological: Negative.   Psychiatric/Behavioral: Negative.   All other systems reviewed and are negative.    BP 120/90  Pulse 79  Ht 5\' 1"  (1.549 m)  Wt 179 lb 8 oz (81.421 kg)  BMI 33.92 kg/m2 oxygen sat 98% at rest, dropping to the 70s with exertion which return to the high 90s with adjustment of the pulse oximeter indicating her thick nails were affecting the pulse oximeter Physical Exam  Nursing note and vitals reviewed. Constitutional: She is oriented to person, place, and time. She appears well-developed and well-nourished.  HENT:  Head: Normocephalic.  Nose: Nose normal.  Mouth/Throat: Oropharynx is clear and moist.  Eyes: Conjunctivae are normal. Pupils are equal, round, and reactive to light.  Neck: Normal range of motion. Neck supple. No  JVD present.  Cardiovascular: Normal rate, regular rhythm, S1 normal, S2 normal, normal heart sounds and intact distal pulses.  Exam reveals no gallop and no friction rub.   No murmur heard. Pulmonary/Chest: Effort normal and breath sounds normal. No respiratory distress. She has no wheezes. She has no rales. She exhibits no tenderness.  Abdominal: Soft. Bowel sounds are normal. She exhibits no distension. There is no tenderness.  Musculoskeletal: Normal range of motion. She exhibits no edema and no tenderness.  Lymphadenopathy:    She has no cervical adenopathy.  Neurological: She is alert and oriented to person, place, and time. Coordination normal.  Skin: Skin is warm and dry. No rash noted. No erythema.  Psychiatric: She has a normal mood and affect. Her behavior is normal. Judgment and thought content normal.         Assessment and Plan

## 2011-08-17 NOTE — Patient Instructions (Addendum)
You are doing well. No medication changes were made.  Please call us if you have new issues that need to be addressed before your next appt.  Your physician wants you to follow-up in: 12 months.  You will receive a reminder letter in the mail two months in advance. If you don't receive a letter, please call our office to schedule the follow-up appointment. 

## 2011-08-17 NOTE — Assessment & Plan Note (Signed)
No significant edema on today's visit. Large discoloration of the inside of her soles around the arch is of uncertain etiology.

## 2011-08-17 NOTE — Assessment & Plan Note (Signed)
I feel her low oxygen level is likely a artifact from her thick nails. With adjustment, saturations improved to 98%. She's not short of breath, not working hard to breathe whatsoever with exertion. He saturations in the 70s as seen in urgent care and briefly today is likely not accurate. I suggested she come back to the office for verification when she is changing her nails and has one of the  Thick press on nails removed .

## 2011-08-17 NOTE — Assessment & Plan Note (Signed)
Blood pressure is well controlled on today's visit. No changes made to the medications. 

## 2011-08-31 DIAGNOSIS — H269 Unspecified cataract: Secondary | ICD-10-CM | POA: Diagnosis not present

## 2011-08-31 DIAGNOSIS — M329 Systemic lupus erythematosus, unspecified: Secondary | ICD-10-CM | POA: Diagnosis not present

## 2011-08-31 DIAGNOSIS — Z79899 Other long term (current) drug therapy: Secondary | ICD-10-CM | POA: Diagnosis not present

## 2011-08-31 DIAGNOSIS — T372X5A Adverse effect of antimalarials and drugs acting on other blood protozoa, initial encounter: Secondary | ICD-10-CM | POA: Diagnosis not present

## 2011-09-09 ENCOUNTER — Ambulatory Visit: Payer: Self-pay | Admitting: Surgery

## 2011-10-11 DIAGNOSIS — R51 Headache: Secondary | ICD-10-CM | POA: Diagnosis not present

## 2011-10-21 ENCOUNTER — Encounter: Payer: Self-pay | Admitting: *Deleted

## 2011-10-25 ENCOUNTER — Encounter: Payer: Self-pay | Admitting: Family Medicine

## 2011-10-25 ENCOUNTER — Ambulatory Visit (INDEPENDENT_AMBULATORY_CARE_PROVIDER_SITE_OTHER): Payer: 59 | Admitting: Family Medicine

## 2011-10-25 VITALS — BP 132/102 | HR 70 | Temp 98.1°F | Resp 16 | Ht 62.0 in | Wt 187.2 lb

## 2011-10-25 DIAGNOSIS — I10 Essential (primary) hypertension: Secondary | ICD-10-CM

## 2011-10-25 DIAGNOSIS — E876 Hypokalemia: Secondary | ICD-10-CM | POA: Diagnosis not present

## 2011-10-25 DIAGNOSIS — D329 Benign neoplasm of meninges, unspecified: Secondary | ICD-10-CM | POA: Insufficient documentation

## 2011-10-25 DIAGNOSIS — R609 Edema, unspecified: Secondary | ICD-10-CM

## 2011-10-25 DIAGNOSIS — K219 Gastro-esophageal reflux disease without esophagitis: Secondary | ICD-10-CM | POA: Insufficient documentation

## 2011-10-25 DIAGNOSIS — R7309 Other abnormal glucose: Secondary | ICD-10-CM

## 2011-10-25 LAB — CBC WITH DIFFERENTIAL/PLATELET
Basophils Absolute: 0 10*3/uL (ref 0.0–0.1)
Eosinophils Relative: 1 % (ref 0–5)
HCT: 33.9 % — ABNORMAL LOW (ref 36.0–46.0)
Hemoglobin: 10.8 g/dL — ABNORMAL LOW (ref 12.0–15.0)
Lymphocytes Relative: 43 % (ref 12–46)
Lymphs Abs: 3.7 10*3/uL (ref 0.7–4.0)
MCV: 73.9 fL — ABNORMAL LOW (ref 78.0–100.0)
Monocytes Absolute: 0.7 10*3/uL (ref 0.1–1.0)
Monocytes Relative: 8 % (ref 3–12)
RDW: 18.6 % — ABNORMAL HIGH (ref 11.5–15.5)
WBC: 8.6 10*3/uL (ref 4.0–10.5)

## 2011-10-25 LAB — COMPREHENSIVE METABOLIC PANEL
Albumin: 4.3 g/dL (ref 3.5–5.2)
BUN: 12 mg/dL (ref 6–23)
CO2: 28 mEq/L (ref 19–32)
Calcium: 9.3 mg/dL (ref 8.4–10.5)
Chloride: 103 mEq/L (ref 96–112)
Creat: 0.76 mg/dL (ref 0.50–1.10)
Glucose, Bld: 78 mg/dL (ref 70–99)

## 2011-10-25 NOTE — Assessment & Plan Note (Signed)
Improved; follow-up with NS in one year.

## 2011-10-25 NOTE — Progress Notes (Signed)
[address]   [place], [place]  [address]   [phone]  Subjective:    Patient ID: Maureen Hodge, female    DOB: 1950/04/19, 61 y.o.   MRN: [id number]  HPIThis 61 y.o. female presents to establish care and for four month follow-up:  1.  HA:  Much improved since brain surgery and resection of meningioma.  [facility].   Now having one headache per month on average; was having daily headaches.  Doing well.    2.  Bronchitis: in 07/2011; much improved and now resolved.  Took a while to recover.  Went on bike trip after last visit.  No breathing troubles now; no persistent cough.  3.  HTN: six month follow-up; no changes to management made at last visit.   Blood pressure elevated at visit today.  Checking once weekly at home; runs 120/85.  Has gained weight; has gained 6 pounds in three months.  Has been eating more.  Appetite increased.  Not exercising.  Denies chest pain, palpitations, shortness of breath; +persistent leg swelling; compliance with compression stockings.  4.  Meningioma NS repair:  last follow-up in 06/2011; follow-up in one year.  +screw in L frontal region that is palpable; bothers pt.   Tender to palpation.    5. Hypokalemia: taking two KCL tablets daily.  No problems taking pills. Due for repeat labs.  6.  Glucose Intolerance: has gained six pounds in past three months; plans to lose weight and start exercising again.      Review of Systems  Constitutional: Positive for appetite change. Negative for chills, diaphoresis, activity change and fatigue.  HENT: Negative for congestion, rhinorrhea, sneezing and postnasal drip.   Respiratory: Negative for cough, choking, shortness of breath, wheezing and stridor.   Cardiovascular: Positive for leg swelling. Negative for chest pain and palpitations.  Gastrointestinal: Negative for nausea, vomiting, diarrhea and constipation.  Skin: Negative for rash.  Neurological: Positive for  headaches. Negative for facial asymmetry and light-headedness.        Past Medical History  Diagnosis Date  . Migraines   . Hypertension   . PLMD (periodic limb movement disorder)   . Diabetes mellitus     Pre-diabetes  . Brain tumor 05/2011    s/p resection  [facility].  . Lupus erythematosus   . Bronchitis, not specified as acute or chronic   . Diffuse cystic mastopathy   . Allergic rhinitis, cause unspecified   . Other convulsions   . Intervertebral disc disorder with myelopathy, unspecified region   . Cerebral aneurysm, nonruptured   . Esophageal reflux   . Hypopotassemia 06/01/2007  . Unspecified sleep apnea     CPAP titrated to 7cm    Past Surgical History  Procedure Date  . Total abdominal hysterectomy 1979    postpartum hemorrhage ovaries intact  . Dilation and curettage of uterus     x 5  . Hemorrhoid surgery   . Breast surgery     x 5 due to fibrocystic disease  . Foot surgery   . Brain surgery 2013  . Lumbar surgery 2000  . Brain avm repair   . Tonsillectomy and adenoidectomy     Prior to Admission medications   Medication Sig Start Date End Date Taking? Authorizing Provider  atenolol (TENORMIN) 25 MG tablet Take 25 mg by mouth as directed. Take 1 tablet every morning and 2 tablets at bedtime.    Yes Historical Provider, MD  esomeprazole (NEXIUM) 40 MG capsule Take 40 mg by mouth daily before breakfast.     Yes Historical Provider, MD  estrogens-methylTEST 1.25-2.5 MG TABS per tablet Take 1 tablet by mouth daily.   Yes Historical Provider, MD  gabapentin (NEURONTIN) 800 MG tablet Take 800 mg by mouth 4 (four) times daily.     Yes Historical Provider, MD  HYDROcodone-acetaminophen (LORTAB) 7.5-500 MG per tablet Take 1 tablet by mouth every 6 (six) hours as needed.     Yes Historical Provider, MD  hydroxychloroquine (PLAQUENIL) 200 MG tablet Take 200 mg by mouth 2 (two) times daily.     Yes Historical Provider, MD  nitroGLYCERIN (NITROSTAT) 0.4 MG SL  tablet Place 0.4 mg under the tongue every 5 (five) minutes as needed.     Yes Historical Provider, MD  nortriptyline (PAMELOR) 75 MG capsule Take 75 mg by mouth at bedtime.   Yes Historical Provider, MD  potassium chloride SA (K-DUR,KLOR-CON) 20 MEQ tablet Takes 2 tablets twice daily.   Yes Historical Provider, MD  triamterene-hydrochlorothiazide (MAXZIDE-25) 37.5-25 MG per tablet Take 1 tablet by mouth daily.     Yes Historical Provider, MD  furosemide (LASIX) 40 MG tablet Take 40 mg by mouth daily.      Historical Provider, MD  pramipexole (MIRAPEX) 0.125 MG tablet Take 0.125 mg by mouth 3 (three) times daily.      Historical Provider, MD    Allergies  Allergen Reactions  . Lisinopril Swelling    Face and lips    History   Social History  . Marital Status: Single    Spouse Name: N/A    Number of Children: 2  . Years of Education: N/A   Occupational History  . Retired    Social History Main Topics  . Smoking status: Never Smoker   . Smokeless tobacco: Never Used  . Alcohol Use: No  . Drug Use: No  . Sexually Active: Not on file   Other Topics Concern  . Not on file   Social History Narrative   Married  X 40 years, happily , no abuse.Gets regular exercise; walking 3 x per week 2 miles.Caffeine Use: Some.Always uses seat belts. Smoke alarm in the home.    Family History  Problem Relation Age of Onset  . Heart disease Mother   . Heart failure Mother     CHF  . Hypertension Mother   . Stroke Mother   . Coronary artery disease Other     family history  . Cancer Other     family history  . Hypertension Sister   . Diabetes Daughter   . Crohn's disease Sister     Objective:   Physical Exam  Nursing note and vitals reviewed. Constitutional: She is oriented to person, place, and time. She appears well-developed and well-nourished. No distress.  HENT:  Head: Normocephalic and atraumatic.  Mouth/Throat: Oropharynx is clear and moist.  Eyes: Conjunctivae normal and  EOM are normal. Pupils are equal, round, and reactive to light.  Neck: Normal range of motion. Neck supple. No tracheal deviation present. No thyromegaly present.  Cardiovascular: Normal rate, regular rhythm, normal heart sounds and intact distal pulses.  Exam reveals no gallop and no friction rub.   No murmur heard. Pulmonary/Chest: Effort normal and breath sounds normal. No respiratory distress. She has no wheezes. She has no rales.  Abdominal: Soft. Bowel sounds are normal. She exhibits no distension. There is no tenderness. There is no rebound and no guarding.  Lymphadenopathy:  She has no cervical adenopathy.  Neurological: She is alert and oriented to person, place, and time. No cranial nerve deficit. She exhibits normal muscle tone. Coordination normal.  Skin: She is not diaphoretic.       Scaling rash B ears.  Psychiatric: She has a normal mood and affect. Her behavior is normal. Judgment and thought content normal.       Assessment & Plan:

## 2011-10-25 NOTE — Assessment & Plan Note (Signed)
Controlled with Nexium; no changes.

## 2011-10-25 NOTE — Assessment & Plan Note (Signed)
Uncontrolled but first visit to new office; no changes to management today; start checking blood pressure daily at home.  Call if readings remain > 140/90.  Asymptomatic; obtain labs.

## 2011-10-25 NOTE — Assessment & Plan Note (Signed)
Controlled with compression stockings, diuretic use.  Recommend regular exercise, weight loss. Obtain labs.

## 2011-10-25 NOTE — Assessment & Plan Note (Signed)
Improved; repeat labs today; no changes to management at this time.

## 2011-10-25 NOTE — Patient Instructions (Addendum)
1. Essential hypertension, benign  CBC with Differential, Comprehensive metabolic panel  2. Hypokalemia    3. GERD (gastroesophageal reflux disease)

## 2011-10-25 NOTE — Assessment & Plan Note (Signed)
Stable; six pound weight gain in past three months; obtain labs.  Recommend weight loss, exercise, low-sugar and low-carb food intake.

## 2011-10-27 ENCOUNTER — Encounter: Payer: Self-pay | Admitting: Family Medicine

## 2011-10-30 NOTE — Progress Notes (Signed)
Reviewed and agree.

## 2011-11-04 ENCOUNTER — Other Ambulatory Visit: Payer: Self-pay | Admitting: Family Medicine

## 2011-11-09 DIAGNOSIS — L819 Disorder of pigmentation, unspecified: Secondary | ICD-10-CM | POA: Diagnosis not present

## 2011-11-09 DIAGNOSIS — L930 Discoid lupus erythematosus: Secondary | ICD-10-CM | POA: Diagnosis not present

## 2012-01-01 ENCOUNTER — Other Ambulatory Visit: Payer: Self-pay | Admitting: Family Medicine

## 2012-01-24 ENCOUNTER — Ambulatory Visit (INDEPENDENT_AMBULATORY_CARE_PROVIDER_SITE_OTHER): Payer: 59 | Admitting: Family Medicine

## 2012-01-24 ENCOUNTER — Ambulatory Visit: Payer: 59

## 2012-01-24 ENCOUNTER — Encounter: Payer: Self-pay | Admitting: Family Medicine

## 2012-01-24 VITALS — BP 116/81 | HR 63 | Temp 97.7°F | Resp 18 | Ht 62.0 in | Wt 181.0 lb

## 2012-01-24 DIAGNOSIS — I10 Essential (primary) hypertension: Secondary | ICD-10-CM

## 2012-01-24 DIAGNOSIS — E7800 Pure hypercholesterolemia, unspecified: Secondary | ICD-10-CM | POA: Insufficient documentation

## 2012-01-24 DIAGNOSIS — D649 Anemia, unspecified: Secondary | ICD-10-CM | POA: Diagnosis not present

## 2012-01-24 DIAGNOSIS — Z01419 Encounter for gynecological examination (general) (routine) without abnormal findings: Secondary | ICD-10-CM | POA: Insufficient documentation

## 2012-01-24 DIAGNOSIS — R7309 Other abnormal glucose: Secondary | ICD-10-CM | POA: Diagnosis not present

## 2012-01-24 DIAGNOSIS — M79662 Pain in left lower leg: Secondary | ICD-10-CM

## 2012-01-24 DIAGNOSIS — Z0000 Encounter for general adult medical examination without abnormal findings: Secondary | ICD-10-CM | POA: Diagnosis not present

## 2012-01-24 DIAGNOSIS — M79609 Pain in unspecified limb: Secondary | ICD-10-CM

## 2012-01-24 LAB — POCT URINALYSIS DIPSTICK
Bilirubin, UA: NEGATIVE
Glucose, UA: NEGATIVE
Leukocytes, UA: NEGATIVE
Nitrite, UA: NEGATIVE
Urobilinogen, UA: 0.2

## 2012-01-24 LAB — POCT UA - MICROSCOPIC ONLY: Yeast, UA: NEGATIVE

## 2012-01-24 LAB — CBC WITH DIFFERENTIAL/PLATELET
Eosinophils Absolute: 0.1 10*3/uL (ref 0.0–0.7)
Eosinophils Relative: 1 % (ref 0–5)
HCT: 37.2 % (ref 36.0–46.0)
Lymphocytes Relative: 41 % (ref 12–46)
Lymphs Abs: 2.6 10*3/uL (ref 0.7–4.0)
MCH: 26.9 pg (ref 26.0–34.0)
MCV: 79.5 fL (ref 78.0–100.0)
Monocytes Absolute: 0.5 10*3/uL (ref 0.1–1.0)
Platelets: 338 10*3/uL (ref 150–400)
RBC: 4.68 MIL/uL (ref 3.87–5.11)
RDW: 18.2 % — ABNORMAL HIGH (ref 11.5–15.5)

## 2012-01-24 LAB — IRON AND TIBC
TIBC: 518 ug/dL — ABNORMAL HIGH (ref 250–470)
UIBC: 483 ug/dL — ABNORMAL HIGH (ref 125–400)

## 2012-01-24 LAB — LIPID PANEL
Cholesterol: 135 mg/dL (ref 0–200)
HDL: 46 mg/dL (ref >39)
Total CHOL/HDL Ratio: 2.9 Ratio
Triglycerides: 54 mg/dL (ref <150)
VLDL: 11 mg/dL (ref 0–40)

## 2012-01-24 LAB — COMPREHENSIVE METABOLIC PANEL
ALT: 13 U/L (ref 0–35)
BUN: 17 mg/dL (ref 6–23)
CO2: 26 mEq/L (ref 19–32)
Calcium: 9.3 mg/dL (ref 8.4–10.5)
Chloride: 104 mEq/L (ref 96–112)
Creat: 0.74 mg/dL (ref 0.50–1.10)
Total Bilirubin: 0.3 mg/dL (ref 0.3–1.2)

## 2012-01-24 MED ORDER — GABAPENTIN 800 MG PO TABS: 800.0000 mg | ORAL_TABLET | Freq: Four times a day (QID) | ORAL | Status: DC

## 2012-01-24 MED ORDER — NITROGLYCERIN 0.4 MG SL SUBL: 0.4000 mg | SUBLINGUAL_TABLET | SUBLINGUAL | Status: DC | PRN

## 2012-01-24 MED ORDER — ESOMEPRAZOLE MAGNESIUM 40 MG PO CPDR: 40.0000 mg | DELAYED_RELEASE_CAPSULE | Freq: Every day | ORAL | Status: DC

## 2012-01-24 MED ORDER — HYDROCODONE-ACETAMINOPHEN 7.5-500 MG PO TABS: 1.0000 | ORAL_TABLET | Freq: Four times a day (QID) | ORAL | Status: DC | PRN

## 2012-01-24 MED ORDER — TRIAMTERENE-HCTZ 37.5-25 MG PO TABS: 1.0000 | ORAL_TABLET | Freq: Every day | ORAL | Status: DC

## 2012-01-24 MED ORDER — EST ESTROGENS-METHYLTEST 1.25-2.5 MG PO TABS: 1.0000 | ORAL_TABLET | Freq: Every day | ORAL | Status: DC

## 2012-01-24 MED ORDER — POTASSIUM CHLORIDE CRYS ER 20 MEQ PO TBCR: 40.0000 meq | EXTENDED_RELEASE_TABLET | Freq: Two times a day (BID) | ORAL | Status: DC

## 2012-01-24 MED ORDER — ATENOLOL 25 MG PO TABS: ORAL_TABLET | ORAL | Status: DC

## 2012-01-24 MED ORDER — NORTRIPTYLINE HCL 25 MG PO CAPS: 75.0000 mg | ORAL_CAPSULE | Freq: Every day | ORAL | Status: DC

## 2012-01-24 NOTE — Assessment & Plan Note (Signed)
Completed in office; pap smear obtained; mammogram UTD.  Continue HRT until age 62; discuss weaning at next CPE.

## 2012-01-24 NOTE — Progress Notes (Signed)
[address]   [place], [place]  [address]   [phone]  Subjective:    Patient ID: Maureen Hodge, female    DOB: [birth year], 62 y.o.   MRN: [id number]  HPIThis 62 y.o. female presents for evaluation for CPE.   Last CPE 10/06/10. Pap smear 04/2010. Mammogram 12/2011.  [facility].  Dr. Gennie [facility].  S/p diagnostic mammogram [facility] last week 01/2012.   Colonoscopy 2007.[facility].  Repeat in ten years. TDAP 10/2010. Pneumovax in past. Flu vaccines refuses. Eye exam 2013; +glasses. Dental exam 11/2011; every six months.   Review of Systems  Constitutional: Positive for diaphoresis. Negative for fever, chills, activity change, appetite change, fatigue and unexpected weight change.  HENT: Negative for hearing loss, ear pain, nosebleeds, congestion, sore throat, facial swelling, rhinorrhea, sneezing, drooling, mouth sores, trouble swallowing, neck pain, neck stiffness, dental problem, voice change, postnasal drip, sinus pressure, tinnitus and ear discharge.   Eyes: Positive for visual disturbance. Negative for photophobia, pain, discharge, redness and itching.  Respiratory: Negative for apnea, cough, choking, chest tightness, shortness of breath, wheezing and stridor.   Cardiovascular: Positive for leg swelling. Negative for chest pain and palpitations.  Gastrointestinal: Negative for nausea, vomiting, abdominal pain, diarrhea, constipation, blood in stool, abdominal distention, anal bleeding and rectal pain.  Genitourinary: Negative for dysuria, urgency, frequency, hematuria, flank pain, decreased urine volume, vaginal bleeding, vaginal discharge, enuresis, difficulty urinating, genital sores, vaginal pain, menstrual problem, pelvic pain and dyspareunia.  Musculoskeletal: Positive for myalgias. Negative for back pain, arthralgias and gait problem.  Skin: Negative for color change, pallor, rash and wound.  Neurological: Positive for headaches. Negative for dizziness, tremors, seizures,  syncope, facial asymmetry, speech difficulty, weakness, light-headedness and numbness.  Hematological: Negative for adenopathy. Does not bruise/bleed easily.  Psychiatric/Behavioral: Negative for suicidal ideas, hallucinations, behavioral problems, confusion, sleep disturbance, self-injury, dysphoric mood, decreased concentration and agitation. The patient is not nervous/anxious and is not hyperactive.         Past Medical History  Diagnosis Date  . Migraines   . Hypertension   . PLMD (periodic limb movement disorder)   . Diabetes mellitus     Pre-diabetes  . Brain tumor 05/2011    s/p resection  [facility].  . Lupus erythematosus   . Bronchitis, not specified as acute or chronic   . Diffuse cystic mastopathy   . Allergic rhinitis, cause unspecified   . Other convulsions   . Intervertebral disc disorder with myelopathy, unspecified region   . Cerebral aneurysm, nonruptured   . Esophageal reflux   . Hypopotassemia 06/01/2007  . Unspecified sleep apnea     CPAP titrated to 7cm    Past Surgical History  Procedure Date  . Total abdominal hysterectomy 1979    postpartum hemorrhage ovaries intact  . Dilation and curettage of uterus     x 5  . Hemorrhoid surgery   . Breast surgery     x 5 due to fibrocystic disease  . Foot surgery   . Brain surgery 2013  . Lumbar surgery 2000  . Brain avm repair   . Tonsillectomy and adenoidectomy     Prior to Admission medications   Medication Sig Start Date End Date Taking? Authorizing Provider  atenolol (TENORMIN) 25 MG tablet TAKE 1 TABLET IN THE MORNING AND 2 TABLETS AT BEDTIME 01/01/12  Yes Heather M Marte, PA-C  esomeprazole (NEXIUM) 40 MG capsule Take 40 mg by mouth daily before breakfast.     Yes Historical Provider, MD  estrogens-methylTEST  1.25-2.5 MG TABS per tablet Take 1 tablet by mouth daily.   Yes Historical Provider, MD  gabapentin (NEURONTIN) 800 MG tablet Take 800 mg by mouth 4 (four) times daily.     Yes Historical  Provider, MD  HYDROcodone-acetaminophen (LORTAB) 7.5-500 MG per tablet Take 1 tablet by mouth every 6 (six) hours as needed.     Yes Historical Provider, MD  hydroxychloroquine (PLAQUENIL) 200 MG tablet Take 200 mg by mouth 2 (two) times daily.     Yes Historical Provider, MD  nitroGLYCERIN (NITROSTAT) 0.4 MG SL tablet Place 0.4 mg under the tongue every 5 (five) minutes as needed.     Yes Historical Provider, MD  nortriptyline (PAMELOR) 25 MG capsule TAKE ONE CAPSULE BY MOUTH 3 TIMES A DAY 11/04/11  Yes Ryan M Dunn, PA-C  potassium chloride SA (K-DUR,KLOR-CON) 20 MEQ tablet Takes 2 tablets twice daily.   Yes Historical Provider, MD  pramipexole (MIRAPEX) 0.125 MG tablet Take 0.125 mg by mouth 3 (three) times daily.     Yes Historical Provider, MD  triamterene-hydrochlorothiazide (MAXZIDE-25) 37.5-25 MG per tablet Take 1 tablet by mouth daily.     Yes Historical Provider, MD  furosemide (LASIX) 40 MG tablet Take 40 mg by mouth daily.      Historical Provider, MD  nortriptyline (PAMELOR) 75 MG capsule Take 75 mg by mouth at bedtime.    Historical Provider, MD    Allergies  Allergen Reactions  . Lisinopril Swelling    Face and lips    History   Social History  . Marital Status: Single    Spouse Name: N/A    Number of Children: 2  . Years of Education: N/A   Occupational History  . Retired    Social History Main Topics  . Smoking status: Never Smoker   . Smokeless tobacco: Never Used  . Alcohol Use: No  . Drug Use: No  . Sexually Active: Not on file   Other Topics Concern  . Not on file   Social History Narrative   Married  X 40 years, happily , no abuse.Gets regular exercise; walking 3 x per week 2 miles.Caffeine Use: Some.Always uses seat belts. Smoke alarm in the home.    Family History  Problem Relation Age of Onset  . Heart disease Mother   . Heart failure Mother     CHF  . Hypertension Mother   . Stroke Mother   . Coronary artery disease Other     family history    . Cancer Other     family history  . Hypertension Sister   . Diabetes Daughter   . Crohn's disease Sister     Objective:   Physical Exam  Nursing note and vitals reviewed. Constitutional: She appears well-developed and well-nourished. No distress.  HENT:  Head: Normocephalic and atraumatic.  Right Ear: External ear normal.  Left Ear: External ear normal.  Nose: Nose normal.  Mouth/Throat: Oropharynx is clear and moist.  Eyes: Conjunctivae normal and EOM are normal. Pupils are equal, round, and reactive to light.  Neck: Normal range of motion. Neck supple. No JVD present. No thyromegaly present.  Cardiovascular: Normal rate, regular rhythm, normal heart sounds and intact distal pulses.  Exam reveals no gallop and no friction rub.   No murmur heard. Pulmonary/Chest: Effort normal and breath sounds normal. No respiratory distress. She has no wheezes. She has no rales.  Abdominal: Soft. Bowel sounds are normal. She exhibits no distension and no mass. There is no  tenderness. There is no rebound and no guarding.  Genitourinary: Rectum normal and vagina normal. Rectal exam shows no external hemorrhoid and no fissure. No breast swelling, tenderness, discharge or bleeding. There is no rash, tenderness or lesion on the right labia. There is no rash, tenderness or lesion on the left labia. Right adnexum displays no mass, no tenderness and no fullness. Left adnexum displays no mass, no tenderness and no fullness.  Musculoskeletal:       Left shoulder: Normal.       Cervical back: Normal.  Lymphadenopathy:    She has no cervical adenopathy.  Skin: She is not diaphoretic.    Results for orders placed in visit on 01/24/12  POCT URINALYSIS DIPSTICK      Component Value Range   Color, UA yellow     Clarity, UA clear     Glucose, UA neg     Bilirubin, UA neg     Ketones, UA neg     Spec Grav, UA 1.025     Blood, UA trace     pH, UA 7.0     Protein, UA neg     Urobilinogen, UA 0.2      Nitrite, UA neg     Leukocytes, UA Negative    POCT UA - MICROSCOPIC ONLY      Component Value Range   WBC, Ur, HPF, POC 0-1     RBC, urine, microscopic 0-3     Bacteria, U Microscopic trace     Mucus, UA trace     Epithelial cells, urine per micros 0-5     Crystals, Ur, HPF, POC neg     Casts, Ur, LPF, POC neg     Yeast, UA neg    IFOBT (OCCULT BLOOD)      Component Value Range   IFOBT Negative        EKG: NSR: no acute changes.  [facility] reading (PRIMARY) by  Dr. Katrinka Blazing.  L TIB-FIB: NAD.   Assessment & Plan:   1. Routine general medical examination at a health care facility  POCT urinalysis dipstick, Vitamin B12, Vitamin D 25 hydroxy, EKG 12-Lead, POCT UA - Microscopic Only, IFOBT POC (occult bld, rslt in office)  2. Routine gynecological examination    3. Essential hypertension, benign  Comprehensive metabolic panel, TSH  4. Anemia, unspecified  Iron and TIBC, Ferritin, CBC with Differential  5. Pure hypercholesterolemia  Lipid panel  6. Other abnormal glucose  Hemoglobin A1c  7. Pain of left lower leg  DG Tibia/Fibula Left

## 2012-01-24 NOTE — Assessment & Plan Note (Signed)
Anticipatory guidance --- weight loss, exercise.  Pap smear obtained; mammogram UTD.  Colonoscopy UTD and hemosure negative.  Refused flu vaccine; other immunizations UTD.  Obtain labs.

## 2012-01-24 NOTE — Patient Instructions (Addendum)
1. Routine general medical examination at a health care facility  POCT urinalysis dipstick, Vitamin B12, Vitamin D 25 hydroxy, EKG 12-Lead, POCT UA - Microscopic Only, IFOBT POC (occult bld, rslt in office)  2. Routine gynecological examination  Pap IG (Image Guided)  3. Essential hypertension, benign  Comprehensive metabolic panel, TSH  4. Anemia, unspecified  Iron and TIBC, Ferritin, CBC with Differential  5. Pure hypercholesterolemia  Lipid panel  6. Other abnormal glucose  Hemoglobin A1c  7. Pain of left lower leg  DG Tibia/Fibula Left   Achilles Tendinitis  with Rehab Achilles tendinitis is a disorder of the Achilles tendon. The Achilles tendon connects the large calf muscles (Gastrocnemius and Soleus) to the heel bone (calcaneus). This tendon is sometimes called the heel cord. It is important for pushing-off and standing on your toes and is important for walking, running, or jumping. Tendinitis is often caused by overuse and repetitive microtrauma. SYMPTOMS  Pain, tenderness, swelling, warmth, and redness may occur over the Achilles tendon even at rest.  Pain with pushing off, or flexing or extending the ankle.  Pain that is worsened after or during activity. CAUSES   Overuse sometimes seen with rapid increase in exercise programs or in sports requiring running and jumping.  Poor physical conditioning (strength and flexibility or endurance).  Running sports, especially training running down hills.  Inadequate warm-up before practice or play or failure to stretch before participation.  Injury to the tendon. PREVENTION   Warm up and stretch before practice or competition.  Allow time for adequate rest and recovery between practices and competition.  Keep up conditioning.  Keep up ankle and leg flexibility.  Improve or keep muscle strength and endurance.  Improve cardiovascular fitness.  Use proper technique.  Use proper equipment (shoes, skates).  To help prevent  recurrence, taping, protective strapping, or an adhesive bandage may be recommended for several weeks after healing is complete. PROGNOSIS   Recovery may take weeks to several months to heal.  Longer recovery is expected if symptoms have been prolonged.  Recovery is usually quicker if the inflammation is due to a direct blow as compared with overuse or sudden strain. RELATED COMPLICATIONS   Healing time will be prolonged if the condition is not correctly treated. The injury must be given plenty of time to heal.  Symptoms can reoccur if activity is resumed too soon.  Untreated, tendinitis may increase the risk of tendon rupture requiring additional time for recovery and possibly surgery. TREATMENT   The first treatment consists of rest anti-inflammatory medication, and ice to relieve the pain.  Stretching and strengthening exercises after resolution of pain will likely help reduce the risk of recurrence. Referral to a physical therapist or athletic trainer for further evaluation and treatment may be helpful.  A walking boot or cast may be recommended to rest the Achilles tendon. This can help break the cycle of inflammation and microtrauma.  Arch supports (orthotics) may be prescribed or recommended by your caregiver as an adjunct to therapy and rest.  Surgery to remove the inflamed tendon lining or degenerated tendon tissue is rarely necessary and has shown less than predictable results. MEDICATION   Nonsteroidal anti-inflammatory medications, such as aspirin and ibuprofen, may be used for pain and inflammation relief. Do not take within 7 days before surgery. Take these as directed by your caregiver. Contact your caregiver immediately if any bleeding, stomach upset, or signs of allergic reaction occur. Other minor pain relievers, such as acetaminophen, may also be  used.  Pain relievers may be prescribed as necessary by your caregiver. Do not take prescription pain medication for longer  than 4 to 7 days. Use only as directed and only as much as you need.  Cortisone injections are rarely indicated. Cortisone injections may weaken tendons and predispose to rupture. It is better to give the condition more time to heal than to use them. HEAT AND COLD  Cold is used to relieve pain and reduce inflammation for acute and chronic Achilles tendinitis. Cold should be applied for 10 to 15 minutes every 2 to 3 hours for inflammation and pain and immediately after any activity that aggravates your symptoms. Use ice packs or an ice massage.  Heat may be used before performing stretching and strengthening activities prescribed by your caregiver. Use a heat pack or a warm soak. SEEK MEDICAL CARE IF:  Symptoms get worse or do not improve in 2 weeks despite treatment.  New, unexplained symptoms develop. Drugs used in treatment may produce side effects. EXERCISES RANGE OF MOTION (ROM) AND STRETCHING EXERCISES - Achilles Tendinitis  These exercises may help you when beginning to rehabilitate your injury. Your symptoms may resolve with or without further involvement from your physician, physical therapist or athletic trainer. While completing these exercises, remember:   Restoring tissue flexibility helps normal motion to return to the joints. This allows healthier, less painful movement and activity.  An effective stretch should be held for at least 30 seconds.  A stretch should never be painful. You should only feel a gentle lengthening or release in the stretched tissue. STRETCH  Gastroc, Standing   Place hands on wall.  Extend right / left leg, keeping the front knee somewhat bent.  Slightly point your toes inward on your back foot.  Keeping your right / left heel on the floor and your knee straight, shift your weight toward the wall, not allowing your back to arch.  You should feel a gentle stretch in the right / left calf. Hold this position for __________ seconds. Repeat  __________ times. Complete this stretch __________ times per day. STRETCH  Soleus, Standing   Place hands on wall.  Extend right / left leg, keeping the other knee somewhat bent.  Slightly point your toes inward on your back foot.  Keep your right / left heel on the floor, bend your back knee, and slightly shift your weight over the back leg so that you feel a gentle stretch deep in your back calf.  Hold this position for __________ seconds. Repeat __________ times. Complete this stretch __________ times per day. STRETCH  Gastrocsoleus, Standing  Note: This exercise can place a lot of stress on your foot and ankle. Please complete this exercise only if specifically instructed by your caregiver.   Place the ball of your right / left foot on a step, keeping your other foot firmly on the same step.  Hold on to the wall or a rail for balance.  Slowly lift your other foot, allowing your body weight to press your heel down over the edge of the step.  You should feel a stretch in your right / left calf.  Hold this position for __________ seconds.  Repeat this exercise with a slight bend in your knee. Repeat __________ times. Complete this stretch __________ times per day.  STRENGTHENING EXERCISES - Achilles Tendinitis These exercises may help you when beginning to rehabilitate your injury. They may resolve your symptoms with or without further involvement from your physician, physical therapist  or Event organiser. While completing these exercises, remember:   Muscles can gain both the endurance and the strength needed for everyday activities through controlled exercises.  Complete these exercises as instructed by your physician, physical therapist or athletic trainer. Progress the resistance and repetitions only as guided.  You may experience muscle soreness or fatigue, but the pain or discomfort you are trying to eliminate should never worsen during these exercises. If this pain does  worsen, stop and make certain you are following the directions exactly. If the pain is still present after adjustments, discontinue the exercise until you can discuss the trouble with your clinician. STRENGTH - Plantar-flexors   Sit with your right / left leg extended. Holding onto both ends of a rubber exercise band/tubing, loop it around the ball of your foot. Keep a slight tension in the band.  Slowly push your toes away from you, pointing them downward.  Hold this position for __________ seconds. Return slowly, controlling the tension in the band/tubing. Repeat __________ times. Complete this exercise __________ times per day.  STRENGTH - Plantar-flexors   Stand with your feet shoulder width apart. Steady yourself with a wall or table using as little support as needed.  Keeping your weight evenly spread over the width of your feet, rise up on your toes.*  Hold this position for __________ seconds. Repeat __________ times. Complete this exercise __________ times per day.  *If this is too easy, shift your weight toward your right / left leg until you feel challenged. Ultimately, you may be asked to do this exercise with your right / left foot only. STRENGTH  Plantar-flexors, Eccentric  Note: This exercise can place a lot of stress on your foot and ankle. Please complete this exercise only if specifically instructed by your caregiver.   Place the balls of your feet on a step. With your hands, use only enough support from a wall or rail to keep your balance.  Keep your knees straight and rise up on your toes.  Slowly shift your weight entirely to your right / left toes and pick up your opposite foot. Gently and with controlled movement, lower your weight through your right / left foot so that your heel drops below the level of the step. You will feel a slight stretch in the back of your calf at the end position.  Use the healthy leg to help rise up onto the balls of both feet, then lower  weight only on the right / left leg again. Build up to 15 repetitions. Then progress to 3 consecutive sets of 15 repetitions.*  After completing the above exercise, complete the same exercise with a slight knee bend (about 30 degrees). Again, build up to 15 repetitions. Then progress to 3 consecutive sets of 15 repetitions.* Perform this exercise __________ times per day.  *When you easily complete 3 sets of 15, your physician, physical therapist or athletic trainer may advise you to add resistance by wearing a backpack filled with additional weight. STRENGTH - Plantar Flexors, Seated   Sit on a chair that allows your feet to rest flat on the ground. If necessary, sit at the edge of the chair.  Keeping your toes firmly on the ground, lift your right / left heel as far as you can without increasing any discomfort in your ankle. Repeat __________ times. Complete this exercise __________ times a day. *If instructed by your physician, physical therapist or athletic trainer, you may add ____________________ of resistance by placing a weighted  object on your right / left knee. Document Released: 07/22/2004 Document Revised: 03/15/2011 Document Reviewed: 04/04/2008 [facility] Patient Information 2013 [place], [place].

## 2012-01-24 NOTE — Assessment & Plan Note (Signed)
New.  S/p tib-fib films negative; home exercise program provided to perform daily.

## 2012-01-24 NOTE — Assessment & Plan Note (Signed)
New. Repeat today; obtain iron and B12 studies.  Asymptomatic.

## 2012-01-24 NOTE — Progress Notes (Signed)
Reviewed and agree.

## 2012-01-25 LAB — VITAMIN D 25 HYDROXY (VIT D DEFICIENCY, FRACTURES): Vit D, 25-Hydroxy: 43 ng/mL (ref 30–89)

## 2012-01-25 LAB — PAP IG (IMAGE GUIDED)

## 2012-02-01 ENCOUNTER — Telehealth: Payer: Self-pay

## 2012-02-01 NOTE — Telephone Encounter (Signed)
Error

## 2012-03-06 ENCOUNTER — Telehealth: Payer: Self-pay

## 2012-03-06 MED ORDER — PANTOPRAZOLE SODIUM 40 MG PO TBEC: 40.0000 mg | DELAYED_RELEASE_TABLET | Freq: Every day | ORAL | Status: DC

## 2012-03-06 NOTE — Telephone Encounter (Signed)
I have sent rx to pharmacy for generic Protonix (pantoprazole).  Recommend trying medication for next several months; please have pt contact me if medication not effective.

## 2012-03-06 NOTE — Telephone Encounter (Signed)
Spoke w/pt concerning the notice Dr Katrinka Blazing received from [facility] pertaining to Nexium not being covered unless generic alternatives have been tried. Pt stated that she has tried some OTC medications that were not effective. She thinks that they were Prilosec and Prevacid but not sure of the names. She has not tried the Rx strength of these or the pantoprazole. Pt requests a new Rx for anything that Dr Katrinka Blazing feels would be the most effective, and she only has enough Nexium for this week. Pt agrees to call back and report if the new Rx is not effective. Dr Katrinka Blazing do you want to send in a new Rx for pt?

## 2012-03-08 NOTE — Telephone Encounter (Signed)
Spoke w/pt and advised her of new Rx. Pt agreed to try it and to CB if it is not effective.

## 2012-03-09 ENCOUNTER — Telehealth: Payer: Self-pay

## 2012-03-09 NOTE — Telephone Encounter (Signed)
Patient states she is having aching not having a fever and wants to know if there is anything she can take for this. She has taken tylenol with no relief. Please advise if there is anything else we can recommend for this. She states she was just here to see you.

## 2012-03-09 NOTE — Telephone Encounter (Signed)
Pt would like a nurse to call her medication [phone]

## 2012-03-09 NOTE — Telephone Encounter (Signed)
She can try OTC Aleve or Ibuprofen for the next several days.  Any other symptoms?  Cough?  Sore throat?  Runny nose? Nausea?

## 2012-03-09 NOTE — Telephone Encounter (Signed)
No, she does not note any other symptoms with this. I have advised her to try the Aleve instead of the tylenol and see if this is helpful.

## 2012-03-09 NOTE — Telephone Encounter (Signed)
Called, no answer.

## 2012-03-14 DIAGNOSIS — T372X5A Adverse effect of antimalarials and drugs acting on other blood protozoa, initial encounter: Secondary | ICD-10-CM | POA: Diagnosis not present

## 2012-03-14 DIAGNOSIS — H269 Unspecified cataract: Secondary | ICD-10-CM | POA: Diagnosis not present

## 2012-03-14 DIAGNOSIS — M329 Systemic lupus erythematosus, unspecified: Secondary | ICD-10-CM | POA: Diagnosis not present

## 2012-03-14 DIAGNOSIS — Z79899 Other long term (current) drug therapy: Secondary | ICD-10-CM | POA: Diagnosis not present

## 2012-05-01 ENCOUNTER — Ambulatory Visit: Payer: Self-pay | Admitting: Physician Assistant

## 2012-05-01 DIAGNOSIS — M7989 Other specified soft tissue disorders: Secondary | ICD-10-CM | POA: Diagnosis not present

## 2012-05-01 DIAGNOSIS — M25569 Pain in unspecified knee: Secondary | ICD-10-CM | POA: Diagnosis not present

## 2012-05-01 DIAGNOSIS — M7120 Synovial cyst of popliteal space [Baker], unspecified knee: Secondary | ICD-10-CM | POA: Diagnosis not present

## 2012-05-08 DIAGNOSIS — M79609 Pain in unspecified limb: Secondary | ICD-10-CM | POA: Diagnosis not present

## 2012-05-08 DIAGNOSIS — L930 Discoid lupus erythematosus: Secondary | ICD-10-CM | POA: Diagnosis not present

## 2012-05-08 DIAGNOSIS — D235 Other benign neoplasm of skin of trunk: Secondary | ICD-10-CM | POA: Diagnosis not present

## 2012-05-09 ENCOUNTER — Other Ambulatory Visit: Payer: Self-pay

## 2012-05-09 MED ORDER — TRIAMTERENE-HCTZ 37.5-25 MG PO TABS: 1.0000 | ORAL_TABLET | Freq: Every day | ORAL | Status: DC

## 2012-05-11 DIAGNOSIS — M79609 Pain in unspecified limb: Secondary | ICD-10-CM | POA: Diagnosis not present

## 2012-05-17 ENCOUNTER — Ambulatory Visit: Payer: Self-pay | Admitting: Family Medicine

## 2012-05-17 DIAGNOSIS — M47817 Spondylosis without myelopathy or radiculopathy, lumbosacral region: Secondary | ICD-10-CM | POA: Diagnosis not present

## 2012-05-17 DIAGNOSIS — M48061 Spinal stenosis, lumbar region without neurogenic claudication: Secondary | ICD-10-CM | POA: Diagnosis not present

## 2012-05-17 LAB — CREATININE, SERUM: Creatinine: 0.86 mg/dL (ref 0.60–1.30)

## 2012-05-22 DIAGNOSIS — M5126 Other intervertebral disc displacement, lumbar region: Secondary | ICD-10-CM | POA: Diagnosis not present

## 2012-05-22 DIAGNOSIS — M48062 Spinal stenosis, lumbar region with neurogenic claudication: Secondary | ICD-10-CM | POA: Diagnosis not present

## 2012-05-22 DIAGNOSIS — IMO0002 Reserved for concepts with insufficient information to code with codable children: Secondary | ICD-10-CM | POA: Diagnosis not present

## 2012-05-26 DIAGNOSIS — M5126 Other intervertebral disc displacement, lumbar region: Secondary | ICD-10-CM | POA: Diagnosis not present

## 2012-05-26 DIAGNOSIS — M5137 Other intervertebral disc degeneration, lumbosacral region: Secondary | ICD-10-CM | POA: Diagnosis not present

## 2012-05-26 DIAGNOSIS — M48062 Spinal stenosis, lumbar region with neurogenic claudication: Secondary | ICD-10-CM | POA: Diagnosis not present

## 2012-05-26 DIAGNOSIS — IMO0002 Reserved for concepts with insufficient information to code with codable children: Secondary | ICD-10-CM | POA: Diagnosis not present

## 2012-05-30 ENCOUNTER — Encounter: Payer: Self-pay | Admitting: Family Medicine

## 2012-05-30 ENCOUNTER — Ambulatory Visit (INDEPENDENT_AMBULATORY_CARE_PROVIDER_SITE_OTHER): Payer: 59 | Admitting: Family Medicine

## 2012-05-30 VITALS — BP 138/104 | HR 75 | Temp 98.1°F | Resp 16 | Ht 61.75 in | Wt 187.2 lb

## 2012-05-30 DIAGNOSIS — I10 Essential (primary) hypertension: Secondary | ICD-10-CM

## 2012-05-30 DIAGNOSIS — M79609 Pain in unspecified limb: Secondary | ICD-10-CM | POA: Diagnosis not present

## 2012-05-30 DIAGNOSIS — D649 Anemia, unspecified: Secondary | ICD-10-CM

## 2012-05-30 DIAGNOSIS — K219 Gastro-esophageal reflux disease without esophagitis: Secondary | ICD-10-CM

## 2012-05-30 DIAGNOSIS — D509 Iron deficiency anemia, unspecified: Secondary | ICD-10-CM

## 2012-05-30 DIAGNOSIS — M79662 Pain in left lower leg: Secondary | ICD-10-CM

## 2012-05-30 DIAGNOSIS — R7309 Other abnormal glucose: Secondary | ICD-10-CM | POA: Diagnosis not present

## 2012-05-30 LAB — CBC WITH DIFFERENTIAL/PLATELET
Basophils Absolute: 0 10*3/uL (ref 0.0–0.1)
Eosinophils Relative: 1 % (ref 0–5)
HCT: 37.1 % (ref 36.0–46.0)
Hemoglobin: 13.3 g/dL (ref 12.0–15.0)
Lymphocytes Relative: 39 % (ref 12–46)
MCHC: 35.8 g/dL (ref 30.0–36.0)
MCV: 81.2 fL (ref 78.0–100.0)
Monocytes Absolute: 0.5 10*3/uL (ref 0.1–1.0)
Monocytes Relative: 6 % (ref 3–12)
Neutro Abs: 4.8 10*3/uL (ref 1.7–7.7)
RDW: 15.6 % — ABNORMAL HIGH (ref 11.5–15.5)
WBC: 8.9 10*3/uL (ref 4.0–10.5)

## 2012-05-30 LAB — COMPREHENSIVE METABOLIC PANEL
ALT: 10 U/L (ref 0–35)
Albumin: 3.9 g/dL (ref 3.5–5.2)
CO2: 27 mEq/L (ref 19–32)
Glucose, Bld: 86 mg/dL (ref 70–99)
Potassium: 3.9 mEq/L (ref 3.5–5.3)
Sodium: 140 mEq/L (ref 135–145)
Total Protein: 6.5 g/dL (ref 6.0–8.3)

## 2012-05-30 LAB — HEMOGLOBIN A1C
Hgb A1c MFr Bld: 6 % — ABNORMAL HIGH (ref <5.7)
Mean Plasma Glucose: 126 mg/dL — ABNORMAL HIGH (ref <117)

## 2012-05-30 LAB — IRON AND TIBC: %SAT: 11 % — ABNORMAL LOW (ref 20–55)

## 2012-05-30 MED ORDER — ATENOLOL 25 MG PO TABS: ORAL_TABLET | ORAL | Status: DC

## 2012-05-30 NOTE — Assessment & Plan Note (Signed)
Improved at last visit yet persistent iron deficiency; normal B12 levels; repeat today; non-compliance with initiating iron supplement after last visit.  If iron levels still low, will start ferrous sulfate for three months.  Asymptomatic.

## 2012-05-30 NOTE — Progress Notes (Signed)
[address]   [place], [place]  [address]   [phone]  Subjective:    Patient ID: Maureen Hodge, female    DOB: Feb 04, 1950, 62 y.o.   MRN: [id number]  HPI This 62 y.o. female presents for four month follow-up:  1.  HTN:  No changes to management made at last visit; compliance with Atenolol and Maxzide.  Home readings running diastolics 98 and systolic 150s.  Has been under a lot of pain with bulging disc in lumbar spine; also additional personal stressors lately.  Denies HA/dizziness/focal weakness/paresthesias/chest pain/SOB.  +palpitations intermittent; +persistent leg swelling.  2.  Glucose intolerance: weight up six pounds since last visit.  Less active with L leg pain.  3.  Iron deficiency: advised to start ferrous sulfate 325mg  daily at last visit; H/H normal but has suffered with recent anemia.  Colonoscopy 2007 by [facility].  Hemosure negative 01/2012.  B12 and vitamin D levels normal 01/2012 at [facility].  Forgot to start ferrous sulfate after last visit.  4.  L Leg pain: s/p urgent care consultation in [place]. S/p MRI lumbar spine two weeks ago; s/p injection in lumbar spine by [facility].  Refused surgery so tried shots.  L leg pain is improved; +numbness but now improvement.  L lower/anterior leg pain.  Went to walk in clinic at [place].  Sent for MRI; presented to three walk in clinics.  Then with MRI results, referred to [facility].  Taking Hydrocodone every four hours.  Also has oxycodone 5/325.   5. GERD: insurance would no longer cover Nexium; switched to generic Protonix.  No problems with switch to Protonix.  Denies heartburn, indigestion, reflux, nausea, vomiting, abdominal pain.  6.  Dermatologist is referring to Dr. Yetta Barre in [facility] for rheumatology consultation.  Worried about systemic lupus.  Known discoid lupus.  Appointment with rheumatology 06/05/2012.  Review of Systems  Constitutional: Negative for fever, chills, diaphoresis and fatigue.  Eyes: Negative for visual  disturbance.  Respiratory: Negative for shortness of breath, wheezing and stridor.   Cardiovascular: Positive for palpitations and leg swelling. Negative for chest pain.  Endocrine: Negative for polydipsia, polyphagia and polyuria.  Musculoskeletal: Positive for myalgias, back pain and arthralgias. Negative for joint swelling and gait problem.  Neurological: Positive for numbness. Negative for dizziness, tremors, seizures, syncope, facial asymmetry, speech difficulty, weakness, light-headedness and headaches.  Psychiatric/Behavioral: Negative for dysphoric mood. The patient is nervous/anxious.         Past Medical History  Diagnosis Date  . Migraines   . Hypertension   . PLMD (periodic limb movement disorder)   . Diabetes mellitus     Pre-diabetes  . Brain tumor 05/2011    s/p resection  [facility].  . Lupus erythematosus   . Bronchitis, not specified as acute or chronic   . Diffuse cystic mastopathy   . Allergic rhinitis, cause unspecified   . Other convulsions   . Intervertebral disc disorder with myelopathy, unspecified region   . Cerebral aneurysm, nonruptured   . Esophageal reflux   . Hypopotassemia 06/01/2007  . Unspecified sleep apnea     CPAP titrated to 7cm    Past Surgical History  Procedure Laterality Date  . Total abdominal hysterectomy  1979    postpartum hemorrhage ovaries intact  . Dilation and curettage of uterus      x 5  . Hemorrhoid surgery    . Breast surgery      x 5 due to fibrocystic disease  . Foot surgery    .  Brain surgery  2013  . Lumbar surgery  2000  . Brain avm repair    . Tonsillectomy and adenoidectomy      Prior to Admission medications   Medication Sig Start Date End Date Taking? Authorizing Provider  atenolol (TENORMIN) 25 MG tablet 1 tablet every morning and 2 tablets every evening. 01/24/12  Yes Ethelda Chick, MD  estrogens-methylTEST 1.25-2.5 MG TABS per tablet Take 1 tablet by mouth daily. 01/24/12  Yes Ethelda Chick, MD    furosemide (LASIX) 40 MG tablet Take 40 mg by mouth daily.     Yes Historical Provider, MD  gabapentin (NEURONTIN) 800 MG tablet Take 1 tablet (800 mg total) by mouth 4 (four) times daily. 01/24/12  Yes Ethelda Chick, MD  HYDROcodone-acetaminophen (LORTAB) 7.5-500 MG per tablet Take 1 tablet by mouth every 6 (six) hours as needed. 01/24/12  Yes Ethelda Chick, MD  hydroxychloroquine (PLAQUENIL) 200 MG tablet Take 200 mg by mouth 2 (two) times daily.     Yes Historical Provider, MD  mometasone (ELOCON) 0.1 % ointment Apply topically Twice daily as needed. 11/09/11  Yes Historical Provider, MD  nitroGLYCERIN (NITROSTAT) 0.4 MG SL tablet Place 1 tablet (0.4 mg total) under the tongue every 5 (five) minutes as needed for chest pain. 01/24/12  Yes Ethelda Chick, MD  nortriptyline (PAMELOR) 25 MG capsule Take 3 capsules (75 mg total) by mouth at bedtime. 01/24/12  Yes Ethelda Chick, MD  pantoprazole (PROTONIX) 40 MG tablet Take 1 tablet (40 mg total) by mouth daily. 03/06/12  Yes Ethelda Chick, MD  potassium chloride SA (K-DUR,KLOR-CON) 20 MEQ tablet Take 2 tablets (40 mEq total) by mouth 2 (two) times daily. Takes 2 tablets twice daily. 01/24/12  Yes Ethelda Chick, MD  triamterene-hydrochlorothiazide (MAXZIDE-25) 37.5-25 MG per tablet Take 1 tablet by mouth daily. 05/09/12  Yes Ethelda Chick, MD  esomeprazole (NEXIUM) 40 MG capsule Take 1 capsule (40 mg total) by mouth daily before breakfast. 01/24/12   Ethelda Chick, MD  pramipexole (MIRAPEX) 0.125 MG tablet Take 0.125 mg by mouth 3 (three) times daily.     Historical Provider, MD    Allergies  Allergen Reactions  . Lisinopril Swelling    Face and lips    History   Social History  . Marital Status: Single    Spouse Name: N/A    Number of Children: 2  . Years of Education: N/A   Occupational History  . Retired    Social History Main Topics  . Smoking status: Never Smoker   . Smokeless tobacco: Never Used  . Alcohol Use: No  . Drug Use: No   . Sexually Active: Yes   Other Topics Concern  . Not on file   Social History Narrative   Married  X 41 years, happily , no abuse.       Children:  2 children; 4 grandchildren.      Lives: with husband.      Employment: disability.      Tobacco: never      Alcohol: wine once per month      Drugs: none     Exercise: sporadic in 2014.   Caffeine Use: Some.   Always uses seat belts. Smoke alarm in the home.    Family History  Problem Relation Age of Onset  . Heart disease Mother     CHF  . Heart failure Mother     CHF  . Hypertension Mother   .  Stroke Mother   . Cancer Mother 36    unknown primary  . Coronary artery disease Other     family history  . Cancer Other     family history  . Hypertension Sister   . Crohn's disease Sister   . Diabetes Daughter     Objective:   Physical Exam  Nursing note and vitals reviewed. Constitutional: She is oriented to person, place, and time. She appears well-developed and well-nourished. No distress.  Eyes: Conjunctivae and EOM are normal.  Neck: Normal range of motion. Neck supple. No thyromegaly present.  Cardiovascular: Normal rate, regular rhythm and normal heart sounds.  Exam reveals no gallop and no friction rub.   No murmur heard. Pulmonary/Chest: Effort normal and breath sounds normal.  Lymphadenopathy:    She has no cervical adenopathy.  Neurological: She is alert and oriented to person, place, and time. No cranial nerve deficit. She exhibits normal muscle tone. Coordination normal.  Skin: She is not diaphoretic.  Psychiatric: She has a normal mood and affect. Her behavior is normal. Judgment and thought content normal.      REPEAT BLOOD PRESSURE R ARM MANUAL CUFF UMFC:  122/92   HOME BP CUFF:  128/95 PULSE 63.  Assessment & Plan:

## 2012-05-30 NOTE — Patient Instructions (Signed)
1. INCREASE ATENOLOL TO TWO TABLETS TWICE DAILY.   2.  CHECK BLOOD PRESSURE EVERY OTHER DAY.

## 2012-05-30 NOTE — Assessment & Plan Note (Signed)
Worsening after last visit; s/p multiple urgent care visits; s/p MRI lumbar spine with pathology; s/p injection last week by [facility].  Scheduled for second injection 06/14/12.  Maintained on Oxycodone currently.

## 2012-05-30 NOTE — Assessment & Plan Note (Addendum)
Uncontrolled/worsening; increase Atenolol to two tablets bid.  Multiple stressors lately; also suffering with pain due to lumbar spine pathology.  Advised to monitor BP every other day at home.  Close follow-up in three months.  If remains elevated, add low dose Amlodipine.

## 2012-05-30 NOTE — Assessment & Plan Note (Signed)
Persistent; weight gain since last visit due to leg pain, inactivity.  Obtain labs.

## 2012-05-30 NOTE — Assessment & Plan Note (Signed)
Controlled; tolerating switch to Protonix.

## 2012-06-01 ENCOUNTER — Other Ambulatory Visit: Payer: Self-pay

## 2012-06-01 MED ORDER — PANTOPRAZOLE SODIUM 40 MG PO TBEC: 40.0000 mg | DELAYED_RELEASE_TABLET | Freq: Every day | ORAL | Status: DC

## 2012-06-14 ENCOUNTER — Encounter: Payer: Self-pay | Admitting: Radiology

## 2012-06-14 DIAGNOSIS — M5126 Other intervertebral disc displacement, lumbar region: Secondary | ICD-10-CM | POA: Diagnosis not present

## 2012-06-14 DIAGNOSIS — IMO0002 Reserved for concepts with insufficient information to code with codable children: Secondary | ICD-10-CM | POA: Diagnosis not present

## 2012-06-14 DIAGNOSIS — M48062 Spinal stenosis, lumbar region with neurogenic claudication: Secondary | ICD-10-CM | POA: Diagnosis not present

## 2012-06-27 DIAGNOSIS — IMO0002 Reserved for concepts with insufficient information to code with codable children: Secondary | ICD-10-CM | POA: Diagnosis not present

## 2012-06-27 DIAGNOSIS — M48062 Spinal stenosis, lumbar region with neurogenic claudication: Secondary | ICD-10-CM | POA: Diagnosis not present

## 2012-06-27 DIAGNOSIS — M5126 Other intervertebral disc displacement, lumbar region: Secondary | ICD-10-CM | POA: Diagnosis not present

## 2012-06-30 DIAGNOSIS — IMO0002 Reserved for concepts with insufficient information to code with codable children: Secondary | ICD-10-CM | POA: Diagnosis not present

## 2012-06-30 DIAGNOSIS — M5126 Other intervertebral disc displacement, lumbar region: Secondary | ICD-10-CM | POA: Diagnosis not present

## 2012-06-30 DIAGNOSIS — M48062 Spinal stenosis, lumbar region with neurogenic claudication: Secondary | ICD-10-CM | POA: Diagnosis not present

## 2012-07-10 DIAGNOSIS — Z8673 Personal history of transient ischemic attack (TIA), and cerebral infarction without residual deficits: Secondary | ICD-10-CM | POA: Diagnosis not present

## 2012-07-10 DIAGNOSIS — Z09 Encounter for follow-up examination after completed treatment for conditions other than malignant neoplasm: Secondary | ICD-10-CM | POA: Diagnosis not present

## 2012-07-10 DIAGNOSIS — D320 Benign neoplasm of cerebral meninges: Secondary | ICD-10-CM | POA: Diagnosis not present

## 2012-07-11 DIAGNOSIS — D320 Benign neoplasm of cerebral meninges: Secondary | ICD-10-CM | POA: Diagnosis not present

## 2012-07-24 ENCOUNTER — Other Ambulatory Visit: Payer: Self-pay | Admitting: Family Medicine

## 2012-08-08 DIAGNOSIS — D2370 Other benign neoplasm of skin of unspecified lower limb, including hip: Secondary | ICD-10-CM | POA: Diagnosis not present

## 2012-08-08 DIAGNOSIS — L930 Discoid lupus erythematosus: Secondary | ICD-10-CM | POA: Diagnosis not present

## 2012-08-09 DIAGNOSIS — IMO0002 Reserved for concepts with insufficient information to code with codable children: Secondary | ICD-10-CM | POA: Diagnosis not present

## 2012-08-22 DIAGNOSIS — IMO0002 Reserved for concepts with insufficient information to code with codable children: Secondary | ICD-10-CM | POA: Diagnosis not present

## 2012-08-22 DIAGNOSIS — G4730 Sleep apnea, unspecified: Secondary | ICD-10-CM | POA: Diagnosis not present

## 2012-08-22 DIAGNOSIS — Z5181 Encounter for therapeutic drug level monitoring: Secondary | ICD-10-CM | POA: Diagnosis not present

## 2012-08-22 DIAGNOSIS — Z79899 Other long term (current) drug therapy: Secondary | ICD-10-CM | POA: Diagnosis not present

## 2012-08-22 DIAGNOSIS — I10 Essential (primary) hypertension: Secondary | ICD-10-CM | POA: Diagnosis not present

## 2012-08-22 DIAGNOSIS — Q283 Other malformations of cerebral vessels: Secondary | ICD-10-CM | POA: Diagnosis not present

## 2012-08-22 DIAGNOSIS — M48061 Spinal stenosis, lumbar region without neurogenic claudication: Secondary | ICD-10-CM | POA: Insufficient documentation

## 2012-08-22 DIAGNOSIS — D320 Benign neoplasm of cerebral meninges: Secondary | ICD-10-CM | POA: Diagnosis not present

## 2012-08-22 DIAGNOSIS — K219 Gastro-esophageal reflux disease without esophagitis: Secondary | ICD-10-CM | POA: Diagnosis not present

## 2012-08-22 DIAGNOSIS — L930 Discoid lupus erythematosus: Secondary | ICD-10-CM | POA: Diagnosis not present

## 2012-08-28 DIAGNOSIS — M5126 Other intervertebral disc displacement, lumbar region: Secondary | ICD-10-CM | POA: Diagnosis not present

## 2012-08-28 DIAGNOSIS — M48061 Spinal stenosis, lumbar region without neurogenic claudication: Secondary | ICD-10-CM | POA: Diagnosis not present

## 2012-09-06 ENCOUNTER — Other Ambulatory Visit: Payer: Self-pay | Admitting: Family Medicine

## 2012-09-06 ENCOUNTER — Telehealth: Payer: Self-pay

## 2012-09-06 NOTE — Telephone Encounter (Signed)
Pt would like to speak with Dr. Katrinka Blazing about her appt on 09/11/12. She is 2 weeks post op for back surgery and has concerns about coming in and wants to talk directly with dr Katrinka Blazing

## 2012-09-07 NOTE — Telephone Encounter (Signed)
Do you want me to call? She is requesting to speak only to you.

## 2012-09-07 NOTE — Telephone Encounter (Signed)
Please call patient; it is fine to postpone appointment to a later date to allow recovery from back surgery.

## 2012-09-07 NOTE — Telephone Encounter (Signed)
Called her to advise. She will call back to reschedule

## 2012-09-11 ENCOUNTER — Ambulatory Visit: Payer: 59 | Admitting: Family Medicine

## 2012-09-11 ENCOUNTER — Other Ambulatory Visit: Payer: Self-pay | Admitting: Family Medicine

## 2012-09-12 ENCOUNTER — Telehealth: Payer: Self-pay

## 2012-09-12 NOTE — Telephone Encounter (Signed)
Request is in Rx pool, has not been addressed yet, please advise.

## 2012-09-12 NOTE — Telephone Encounter (Signed)
Dr. Katrinka Blazing - Patient is completely out of gabapentin.  She says the pharmacy sent over a request for a refill last week, but has not heard from [place].  She was supposed to have had an appointment with Dr. Katrinka Blazing this week, but she had to cancel due to her back surgery.  Please call. [phone]

## 2012-09-13 ENCOUNTER — Telehealth: Payer: Self-pay | Admitting: Radiology

## 2012-09-13 MED ORDER — GABAPENTIN 800 MG PO TABS: 800.0000 mg | ORAL_TABLET | Freq: Four times a day (QID) | ORAL | Status: DC

## 2012-09-13 NOTE — Telephone Encounter (Signed)
Med encounter

## 2012-09-13 NOTE — Telephone Encounter (Signed)
Spoke to pharmacy and they did find Rx from January for the Gabapentin

## 2012-09-13 NOTE — Telephone Encounter (Signed)
I did this yesterday, the Rx from January was in computer at pharmacy, they just did not look back far enough, patient aware and has gotten Rx already.

## 2012-09-13 NOTE — Telephone Encounter (Signed)
Rx escribed to pharmacy.  Please advise patient.

## 2012-09-23 ENCOUNTER — Other Ambulatory Visit: Payer: Self-pay | Admitting: Physician Assistant

## 2012-10-23 ENCOUNTER — Ambulatory Visit (INDEPENDENT_AMBULATORY_CARE_PROVIDER_SITE_OTHER): Payer: 59 | Admitting: Family Medicine

## 2012-10-23 ENCOUNTER — Encounter: Payer: Self-pay | Admitting: Family Medicine

## 2012-10-23 VITALS — BP 106/78 | HR 67 | Temp 98.5°F | Resp 17 | Ht 62.0 in | Wt 187.6 lb

## 2012-10-23 DIAGNOSIS — R7309 Other abnormal glucose: Secondary | ICD-10-CM

## 2012-10-23 DIAGNOSIS — D509 Iron deficiency anemia, unspecified: Secondary | ICD-10-CM

## 2012-10-23 DIAGNOSIS — I10 Essential (primary) hypertension: Secondary | ICD-10-CM

## 2012-10-23 DIAGNOSIS — H6123 Impacted cerumen, bilateral: Secondary | ICD-10-CM

## 2012-10-23 DIAGNOSIS — H6120 Impacted cerumen, unspecified ear: Secondary | ICD-10-CM

## 2012-10-23 DIAGNOSIS — Z23 Encounter for immunization: Secondary | ICD-10-CM

## 2012-10-23 LAB — COMPREHENSIVE METABOLIC PANEL
AST: 17 U/L (ref 0–37)
Albumin: 4 g/dL (ref 3.5–5.2)
Alkaline Phosphatase: 51 U/L (ref 39–117)
Creat: 0.74 mg/dL (ref 0.50–1.10)
Glucose, Bld: 83 mg/dL (ref 70–99)
Potassium: 3.8 mEq/L (ref 3.5–5.3)
Sodium: 139 mEq/L (ref 135–145)
Total Bilirubin: 0.6 mg/dL (ref 0.3–1.2)
Total Protein: 6.7 g/dL (ref 6.0–8.3)

## 2012-10-23 LAB — CBC
Hemoglobin: 14 g/dL (ref 12.0–15.0)
MCH: 30.7 pg (ref 26.0–34.0)
MCHC: 35 g/dL (ref 30.0–36.0)
RDW: 14.1 % (ref 11.5–15.5)

## 2012-10-23 LAB — IRON AND TIBC
%SAT: 26 % (ref 20–55)
Iron: 117 ug/dL (ref 42–145)
TIBC: 442 ug/dL (ref 250–470)
UIBC: 325 ug/dL (ref 125–400)

## 2012-10-23 LAB — HEMOGLOBIN A1C: Mean Plasma Glucose: 140 mg/dL — ABNORMAL HIGH (ref <117)

## 2012-10-23 MED ORDER — NORTRIPTYLINE HCL 25 MG PO CAPS: 75.0000 mg | ORAL_CAPSULE | Freq: Every day | ORAL | Status: DC

## 2012-10-23 MED ORDER — FUROSEMIDE 40 MG PO TABS: 40.0000 mg | ORAL_TABLET | Freq: Every day | ORAL | Status: DC

## 2012-10-23 MED ORDER — POTASSIUM CHLORIDE CRYS ER 20 MEQ PO TBCR: 40.0000 meq | EXTENDED_RELEASE_TABLET | Freq: Two times a day (BID) | ORAL | Status: DC

## 2012-10-23 MED ORDER — EST ESTROGENS-METHYLTEST 1.25-2.5 MG PO TABS: 1.0000 | ORAL_TABLET | Freq: Every day | ORAL | Status: DC

## 2012-10-23 MED ORDER — TRIAMTERENE-HCTZ 37.5-25 MG PO TABS: 1.0000 | ORAL_TABLET | Freq: Every day | ORAL | Status: DC

## 2012-10-23 MED ORDER — NYSTATIN 100000 UNIT/ML MT SUSP: 500000.0000 [IU] | Freq: Four times a day (QID) | OROMUCOSAL | Status: DC

## 2012-10-23 MED ORDER — ATENOLOL 25 MG PO TABS: ORAL_TABLET | ORAL | Status: DC

## 2012-10-23 MED ORDER — PANTOPRAZOLE SODIUM 40 MG PO TBEC: 40.0000 mg | DELAYED_RELEASE_TABLET | Freq: Every day | ORAL | Status: DC

## 2012-10-23 MED ORDER — GABAPENTIN 800 MG PO TABS: 800.0000 mg | ORAL_TABLET | Freq: Four times a day (QID) | ORAL | Status: DC

## 2012-10-23 NOTE — Progress Notes (Signed)
Subjective:    Patient ID: Maureen Hodge, female    DOB: 28-Dec-1950, 62 y.o.   MRN: [id number]  HPI Patient presents today for f/u HTN. Had back surgery 08/28/12. Still in follow up with surgeon. Leg pain significantly improved. Back still having pain. Had restless night last night. Able to drive. Has not had PT. Walking more. Not supposed to bend.  Saw eye doctor last week. Records sent. Needs new lenses in glasses. Has not been taking iron supplementation. Hasn't been checking BP at home. Was 111/82 at last doctor visit. Going to rheumatologist this week. Recommended by dermatologist.  Flu- today  Review of Systems No dizziness.  Some ankle swelling since back surgery. Nortriptyline 75 mg po qhs for sleep No SOB, no cough. Able to lie down to sleep.    Past Medical History  Diagnosis Date  . Migraines   . Hypertension   . PLMD (periodic limb movement disorder)   . Diabetes mellitus     Pre-diabetes  . Brain tumor 05/2011    s/p resection  [facility]  . Lupus erythematosus   . Bronchitis, not specified as acute or chronic   . Diffuse cystic mastopathy   . Allergic rhinitis, cause unspecified   . Other convulsions   . Intervertebral disc disorder with myelopathy, unspecified region   . Cerebral aneurysm, nonruptured   . Esophageal reflux   . Hypopotassemia 06/01/2007  . Unspecified sleep apnea     CPAP titrated to 7cm   Past Surgical History  Procedure Laterality Date  . Total abdominal hysterectomy  1979    postpartum hemorrhage ovaries intact  . Dilation and curettage of uterus      x 5  . Hemorrhoid surgery    . Breast surgery      x 5 due to fibrocystic disease  . Foot surgery    . Brain surgery  2013  . Lumbar surgery  2000  . Brain avm repair    . Tonsillectomy and adenoidectomy    . Spine surgery     Allergies  Allergen Reactions  . Lisinopril Swelling    Face and lips    Objective:   Physical Exam  Nursing note and vitals  reviewed. Constitutional: She is oriented to person, place, and time. She appears well-developed and well-nourished. No distress.  HENT:  Right Ear: External ear normal.  Left Ear: External ear normal.  Ear canals occluded with wax. Upper palate erythematous.   Eyes: Conjunctivae are normal. Pupils are equal, round, and reactive to light. Right eye exhibits no discharge. Left eye exhibits no discharge.  Neck: Normal range of motion. Neck supple. Carotid bruit is not present. No thyromegaly present.  Cardiovascular: Normal rate, regular rhythm and normal heart sounds.   Pulmonary/Chest: Effort normal and breath sounds normal.  Abdominal: Soft. Bowel sounds are normal.  Lymphadenopathy:    She has no cervical adenopathy.  Neurological: She is alert and oriented to person, place, and time.  Skin: Skin is warm and dry. She is not diaphoretic.  Psychiatric: She has a normal mood and affect. Her behavior is normal. Judgment and thought content normal.   FLU VACCINE ADMINISTERED.    Assessment & Plan:  Essential hypertension, benign - Plan: CBC, Comprehensive metabolic panel  Iron deficiency anemia, unspecified - Plan: Iron and TIBC  Other abnormal glucose - Plan: Hemoglobin A1c  Need for prophylactic vaccination and inoculation against influenza - Plan: Flu Vaccine QUAD 36+ mos IM  Cerumen impaction, bilateral  1.  HTN: controlled; obtain labs; continue current medications. 2. Iron deficiency anemia: stable; obtain labs; continue current treatment. 3.  Glucose intolerance: stable; obtain labs; recommend weight loss, exercise, dietary modification. 4. Cerumen impaction B: s/p irrigation in office. 5.  S/p flu vaccine.  Nilda Simmer, M.D.  [facility] [address] [place], [place]  [address] [phone] phone [phone] fax

## 2012-10-26 DIAGNOSIS — L930 Discoid lupus erythematosus: Secondary | ICD-10-CM | POA: Insufficient documentation

## 2012-12-06 DIAGNOSIS — IMO0002 Reserved for concepts with insufficient information to code with codable children: Secondary | ICD-10-CM | POA: Diagnosis not present

## 2012-12-07 ENCOUNTER — Ambulatory Visit: Payer: Self-pay | Admitting: Surgery

## 2012-12-11 ENCOUNTER — Ambulatory Visit: Payer: Medicare Other | Admitting: Cardiovascular Disease

## 2012-12-25 ENCOUNTER — Ambulatory Visit: Payer: Medicare Other | Admitting: Cardiovascular Disease

## 2012-12-26 ENCOUNTER — Ambulatory Visit: Payer: Self-pay | Admitting: Surgery

## 2013-01-01 ENCOUNTER — Ambulatory Visit: Payer: Self-pay | Admitting: Surgery

## 2013-01-02 LAB — PATHOLOGY REPORT

## 2013-01-15 DIAGNOSIS — N6019 Diffuse cystic mastopathy of unspecified breast: Secondary | ICD-10-CM | POA: Diagnosis not present

## 2013-01-23 ENCOUNTER — Encounter: Payer: Self-pay | Admitting: Family Medicine

## 2013-01-23 ENCOUNTER — Ambulatory Visit (INDEPENDENT_AMBULATORY_CARE_PROVIDER_SITE_OTHER): Payer: 59 | Admitting: Family Medicine

## 2013-01-23 VITALS — BP 128/88 | HR 69 | Temp 99.8°F | Resp 18 | Ht 61.25 in | Wt 184.4 lb

## 2013-01-23 DIAGNOSIS — J209 Acute bronchitis, unspecified: Secondary | ICD-10-CM

## 2013-01-23 DIAGNOSIS — Z0000 Encounter for general adult medical examination without abnormal findings: Secondary | ICD-10-CM | POA: Diagnosis not present

## 2013-01-23 DIAGNOSIS — Z23 Encounter for immunization: Secondary | ICD-10-CM | POA: Insufficient documentation

## 2013-01-23 DIAGNOSIS — Z01419 Encounter for gynecological examination (general) (routine) without abnormal findings: Secondary | ICD-10-CM

## 2013-01-23 DIAGNOSIS — R05 Cough: Secondary | ICD-10-CM

## 2013-01-23 DIAGNOSIS — R059 Cough, unspecified: Secondary | ICD-10-CM | POA: Insufficient documentation

## 2013-01-23 LAB — COMPLETE METABOLIC PANEL WITH GFR
ALT: 12 U/L (ref 0–35)
AST: 19 U/L (ref 0–37)
Albumin: 4 g/dL (ref 3.5–5.2)
Alkaline Phosphatase: 59 U/L (ref 39–117)
BUN: 12 mg/dL (ref 6–23)
CALCIUM: 8.5 mg/dL (ref 8.4–10.5)
CHLORIDE: 101 meq/L (ref 96–112)
CO2: 29 meq/L (ref 19–32)
Creat: 0.78 mg/dL (ref 0.50–1.10)
GFR, Est African American: >89 mL/min
GFR, Est Non African American: 83 mL/min
Glucose, Bld: 94 mg/dL (ref 70–99)
Potassium: 3.3 mEq/L — ABNORMAL LOW (ref 3.5–5.3)
Sodium: 137 mEq/L (ref 135–145)
Total Bilirubin: 0.3 mg/dL (ref 0.3–1.2)
Total Protein: 6.6 g/dL (ref 6.0–8.3)

## 2013-01-23 LAB — POCT URINALYSIS DIPSTICK
Bilirubin, UA: NEGATIVE
GLUCOSE UA: NEGATIVE
Ketones, UA: NEGATIVE
LEUKOCYTES UA: NEGATIVE
NITRITE UA: NEGATIVE
PH UA: 7.5
Protein, UA: NEGATIVE
Spec Grav, UA: 1.02
UROBILINOGEN UA: 0.2

## 2013-01-23 LAB — CBC WITH DIFFERENTIAL/PLATELET
Basophils Absolute: 0 10*3/uL (ref 0.0–0.1)
Basophils Relative: 0 % (ref 0–1)
EOS ABS: 0.1 10*3/uL (ref 0.0–0.7)
Eosinophils Relative: 1 % (ref 0–5)
HCT: 41.1 % (ref 36.0–46.0)
HEMOGLOBIN: 14.3 g/dL (ref 12.0–15.0)
Lymphocytes Relative: 26 % (ref 12–46)
Lymphs Abs: 1.7 10*3/uL (ref 0.7–4.0)
MCH: 31 pg (ref 26.0–34.0)
MCHC: 34.8 g/dL (ref 30.0–36.0)
MCV: 89 fL (ref 78.0–100.0)
MONOS PCT: 15 % — AB (ref 3–12)
Monocytes Absolute: 1 10*3/uL (ref 0.1–1.0)
NEUTROS ABS: 4 10*3/uL (ref 1.7–7.7)
NEUTROS PCT: 58 % (ref 43–77)
PLATELETS: 265 10*3/uL (ref 150–400)
RBC: 4.62 MIL/uL (ref 3.87–5.11)
RDW: 14.3 % (ref 11.5–15.5)
WBC: 6.8 10*3/uL (ref 4.0–10.5)

## 2013-01-23 LAB — LIPID PANEL
CHOL/HDL RATIO: 3 ratio
CHOLESTEROL: 124 mg/dL (ref 0–200)
HDL: 41 mg/dL (ref >39)
LDL Cholesterol: 73 mg/dL (ref 0–99)
Triglycerides: 51 mg/dL (ref <150)
VLDL: 10 mg/dL (ref 0–40)

## 2013-01-23 LAB — POCT UA - MICROSCOPIC ONLY
CASTS, UR, LPF, POC: NEGATIVE
CRYSTALS, UR, HPF, POC: NEGATIVE
Mucus, UA: NEGATIVE
Yeast, UA: NEGATIVE

## 2013-01-23 LAB — IFOBT (OCCULT BLOOD): IMMUNOLOGICAL FECAL OCCULT BLOOD TEST: NEGATIVE

## 2013-01-23 LAB — POCT INFLUENZA A/B
INFLUENZA A, POC: NEGATIVE
Influenza B, POC: NEGATIVE

## 2013-01-23 LAB — HEMOGLOBIN A1C
Hgb A1c MFr Bld: 6.1 % — ABNORMAL HIGH (ref <5.7)
MEAN PLASMA GLUCOSE: 128 mg/dL — AB (ref <117)

## 2013-01-23 MED ORDER — POTASSIUM CHLORIDE CRYS ER 20 MEQ PO TBCR: 40.0000 meq | EXTENDED_RELEASE_TABLET | Freq: Two times a day (BID) | ORAL | Status: DC

## 2013-01-23 MED ORDER — PANTOPRAZOLE SODIUM 40 MG PO TBEC: 40.0000 mg | DELAYED_RELEASE_TABLET | Freq: Every day | ORAL | Status: DC

## 2013-01-23 MED ORDER — GUAIFENESIN-CODEINE 100-10 MG/5ML PO SOLN: 5.0000 mL | Freq: Four times a day (QID) | ORAL | Status: DC | PRN

## 2013-01-23 MED ORDER — GABAPENTIN 800 MG PO TABS: 800.0000 mg | ORAL_TABLET | Freq: Four times a day (QID) | ORAL | Status: DC

## 2013-01-23 MED ORDER — NITROGLYCERIN 0.4 MG SL SUBL: 0.4000 mg | SUBLINGUAL_TABLET | SUBLINGUAL | Status: AC | PRN

## 2013-01-23 MED ORDER — NORTRIPTYLINE HCL 25 MG PO CAPS: 75.0000 mg | ORAL_CAPSULE | Freq: Every day | ORAL | Status: DC

## 2013-01-23 MED ORDER — ZOSTER VACCINE LIVE 19400 UNT/0.65ML ~~LOC~~ SOLR
0.6500 mL | Freq: Once | SUBCUTANEOUS | Status: DC
Start: 2013-01-23 — End: 2014-03-04

## 2013-01-23 MED ORDER — HYDROCODONE-ACETAMINOPHEN 7.5-500 MG PO TABS: 1.0000 | ORAL_TABLET | Freq: Four times a day (QID) | ORAL | Status: DC | PRN

## 2013-01-23 MED ORDER — FUROSEMIDE 40 MG PO TABS: 40.0000 mg | ORAL_TABLET | Freq: Every day | ORAL | Status: DC

## 2013-01-23 MED ORDER — TRIAMTERENE-HCTZ 37.5-25 MG PO TABS: 1.0000 | ORAL_TABLET | Freq: Every day | ORAL | Status: DC

## 2013-01-23 MED ORDER — HYDROCODONE-ACETAMINOPHEN 7.5-325 MG PO TABS: 1.0000 | ORAL_TABLET | Freq: Four times a day (QID) | ORAL | Status: DC | PRN

## 2013-01-23 MED ORDER — ATENOLOL 25 MG PO TABS: ORAL_TABLET | ORAL | Status: DC

## 2013-01-23 MED ORDER — AZITHROMYCIN 250 MG PO TABS: ORAL_TABLET | ORAL | Status: DC

## 2013-01-23 MED ORDER — ZOSTER VACCINE LIVE 19400 UNT/0.65ML ~~LOC~~ SOLR: 0.6500 mL | Freq: Once | SUBCUTANEOUS | Status: DC

## 2013-01-23 NOTE — Assessment & Plan Note (Signed)
Anticipatory guidance --- weight loss and exercise. Pap smear UTD 01/2012.  Mammogram UTD 12/2012.  Colonoscopy 2007; hemoccult negative.  rx for Zostavax provided.  Obtain labs.

## 2013-01-23 NOTE — Assessment & Plan Note (Signed)
Completed. Pap smear UTD; mammogram UTD.  Continue HRT for now due to severity of hot flashes.

## 2013-01-23 NOTE — Progress Notes (Signed)
Subjective:    Patient ID: Maureen Hodge, female    DOB: 16-Sep-1950, 63 y.o.   MRN: [id number]  HPI This 63 y.o. female presents for Complete Physical Examination.  Last physical 01/24/12. Pap smear 01/24/2012. WNL. S/p hysterectomy. Mammogram 12/2012.  S/p biopsy 01/01/13 by Dr. Rochel Brome; follow-up in one year.   Colonoscopy 2007; [facility]; repeat in ten years. Tetanus 10/2010. Pneumovax in past. Zostavax never but insurance will pay.  Interested in rx. Influenza vaccine 10/23/2012. Eye exam 12/2012; [facility].  No g/c.  +glasses. Dental exam 12/2012:  Fever, cough:  Onset four days ago.  +fever low grade in office today; +sweats. No chills.  +HA.  No ear pain or sore throat.  +rhinorrhea mild;   +coughing a lot.  Sick contact at recent funeral.  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  Nighttime cough.  +Delsym.   No SOB.  +wheezing.  No v/d.  Using Flonase.  Headaches chronic: much improved since having meningioma resection; requesting refill of Hydrocodone.  No recent follow-up with Dr. Domingo Cocking of [place].  Followed by [facility] annually.    Review of Systems  Constitutional: Negative.   HENT: Positive for congestion.   Eyes: Negative.   Respiratory: Positive for cough and chest tightness.   Cardiovascular: Negative.   Gastrointestinal: Negative.   Endocrine: Negative.   Genitourinary: Negative.   Musculoskeletal: Negative.   Skin: Negative.   Allergic/Immunologic: Negative.   Neurological: Negative.   Hematological: Negative.   Psychiatric/Behavioral: Negative.    Past Medical History  Diagnosis Date  . Migraines   . Hypertension   . PLMD (periodic limb movement disorder)   . Diabetes mellitus     Pre-diabetes  . Brain tumor 05/2011    s/p resection  [facility]  . Lupus erythematosus   . Bronchitis, not specified as acute or chronic   . Diffuse cystic mastopathy   . Allergic rhinitis, cause unspecified   . Other convulsions   . Intervertebral disc  disorder with myelopathy, unspecified region   . Cerebral aneurysm, nonruptured   . Esophageal reflux   . Hypopotassemia 06/01/2007  . Unspecified sleep apnea     CPAP titrated to 7cm   Past Surgical History  Procedure Laterality Date  . Total abdominal hysterectomy  1979    postpartum hemorrhage ovaries intact  . Dilation and curettage of uterus      x 5  . Hemorrhoid surgery    . Breast surgery      x 5 due to fibrocystic disease  . Foot surgery    . Brain surgery  2013  . Lumbar surgery  2000  . Brain avm repair    . Tonsillectomy and adenoidectomy    . Spine surgery     Allergies  Allergen Reactions  . Lisinopril Swelling    Face and lips   Current Outpatient Prescriptions on File Prior to Visit  Medication Sig Dispense Refill  . atenolol (TENORMIN) 25 MG tablet Two tablets every morning, one tablet q evening  90 tablet  11  . estrogen-methylTESTOSTERone (ESTRATEST) 1.25-2.5 MG per tablet Take 1 tablet by mouth daily.  30 tablet  5  . furosemide (LASIX) 40 MG tablet Take 1 tablet (40 mg total) by mouth daily.  30 tablet  11  . gabapentin (NEURONTIN) 800 MG tablet Take 1 tablet (800 mg total) by mouth 4 (four) times daily.  120 tablet  11  . hydroxychloroquine (PLAQUENIL) 200 MG tablet Take 200 mg by  mouth 2 (two) times daily.        . mometasone (ELOCON) 0.1 % ointment Apply topically Twice daily as needed.      . nitroGLYCERIN (NITROSTAT) 0.4 MG SL tablet Place 1 tablet (0.4 mg total) under the tongue every 5 (five) minutes as needed for chest pain.  30 tablet  0  . nortriptyline (PAMELOR) 25 MG capsule Take 3 capsules (75 mg total) by mouth at bedtime.  90 capsule  11  . pantoprazole (PROTONIX) 40 MG tablet Take 1 tablet (40 mg total) by mouth daily.  30 tablet  11  . potassium chloride SA (K-DUR,KLOR-CON) 20 MEQ tablet Take 2 tablets (40 mEq total) by mouth 2 (two) times daily. Takes 2 tablets twice daily.  120 tablet  11  . triamterene-hydrochlorothiazide (MAXZIDE-25)  37.5-25 MG per tablet Take 1 tablet by mouth daily.  30 tablet  11   No current facility-administered medications on file prior to visit.   History   Social History  . Marital Status: Single    Spouse Name: N/A    Number of Children: 2  . Years of Education: N/A   Occupational History  . Retired    Social History Main Topics  . Smoking status: Never Smoker   . Smokeless tobacco: Never Used  . Alcohol Use: No  . Drug Use: No  . Sexual Activity: Yes   Other Topics Concern  . Not on file   Social History Narrative   Married  X 42 years, happily , no abuse.       Children:  2 children; 4 grandchildren.      Lives: with husband.      Employment: disability.  Works part-time at [employer].      Tobacco: never      Alcohol: wine once per month      Drugs: none     Exercise: sporadic in 2014.   Caffeine Use: Some.   Always uses seat belts. Smoke alarm in the home.   Family History  Problem Relation Age of Onset  . Heart disease Mother     CHF  . Heart failure Mother     CHF  . Hypertension Mother   . Stroke Mother   . Cancer Mother 70    unknown primary  . Coronary artery disease Other     family history  . Cancer Other     family history  . Hypertension Sister   . Crohn's disease Sister   . Arthritis Sister     partial knee replacement.  . Diabetes Daughter        Objective:   Physical Exam  Nursing note and vitals reviewed. Constitutional: She is oriented to person, place, and time. She appears well-developed and well-nourished. No distress.  HENT:  Head: Normocephalic and atraumatic.  Right Ear: External ear normal.  Left Ear: External ear normal.  Nose: Nose normal.  Mouth/Throat: Oropharynx is clear and moist. No oropharyngeal exudate.  Eyes: Conjunctivae and EOM are normal. Pupils are equal, round, and reactive to light.  Neck: Normal range of motion. Neck supple. No JVD present. Carotid bruit is not present. No thyromegaly present.    Cardiovascular: Normal rate, regular rhythm and normal heart sounds.  Exam reveals no gallop and no friction rub.   No murmur heard. Pulmonary/Chest: Effort normal and breath sounds normal. She has no wheezes. She has no rales.  Abdominal: Soft. Bowel sounds are normal. She exhibits no distension and no mass. There is  no tenderness. There is no rebound and no guarding.  Genitourinary: Rectum normal and vagina normal. No breast swelling, tenderness, discharge or bleeding. There is no rash, tenderness or lesion on the right labia. There is no rash, tenderness or lesion on the left labia. Right adnexum displays no mass, no tenderness and no fullness. Left adnexum displays no mass, no tenderness and no fullness.  Lymphadenopathy:    She has no cervical adenopathy.  Neurological: She is alert and oriented to person, place, and time. She has normal reflexes. No cranial nerve deficit. She exhibits normal muscle tone. Coordination normal.  Skin: Skin is warm and dry. No rash noted. She is not diaphoretic. No erythema. No pallor.  Psychiatric: She has a normal mood and affect. Her behavior is normal. Judgment and thought content normal.   EKG: NSR: no ST changes.  Results for orders placed in visit on 01/23/13  POCT URINALYSIS DIPSTICK      Result Value Range   Color, UA yellow     Clarity, UA clear     Glucose, UA neg     Bilirubin, UA neg     Ketones, UA neg     Spec Grav, UA 1.020     Blood, UA trace     pH, UA 7.5     Protein, UA neg     Urobilinogen, UA 0.2     Nitrite, UA neg     Leukocytes, UA Negative    POCT UA - MICROSCOPIC ONLY      Result Value Range   WBC, Ur, HPF, POC 0-2     RBC, urine, microscopic 0-5     Bacteria, U Microscopic small     Mucus, UA neg     Epithelial cells, urine per micros 0-12     Crystals, Ur, HPF, POC neg     Casts, Ur, LPF, POC neg     Yeast, UA neg    POCT INFLUENZA A/B      Result Value Range   Influenza A, POC Negative     Influenza B, POC  Negative    IFOBT (OCCULT BLOOD)      Result Value Range   IFOBT Negative         Assessment & Plan:  Routine general medical examination at a health care facility - Plan: POCT urinalysis dipstick, CBC with Differential, TSH, Lipid panel, Hemoglobin A1c, COMPLETE METABOLIC PANEL WITH GFR, EKG 12-Lead, POCT UA - Microscopic Only, IFOBT POC (occult bld, rslt in office)  Cough - Plan: POCT Influenza A/B  Need for Zostavax administration - Plan: zoster vaccine live, PF, (ZOSTAVAX) 16109 UNT/0.65ML injection, DISCONTINUED: zoster vaccine live, PF, (ZOSTAVAX) 60454 UNT/0.65ML injection  Routine gynecological examination  Acute bronchitis  Acute Bronchitis:  New.  Rx for Zpack, Robitussin AC provided. RTC for acute worsening.  Reginia Forts, M.D.  [facility] [address] [place], [place]  [address] [phone] phone [phone] fax

## 2013-01-24 ENCOUNTER — Telehealth: Payer: Self-pay

## 2013-01-24 LAB — TSH: TSH: 1.488 u[IU]/mL (ref 0.350–4.500)

## 2013-01-24 NOTE — Telephone Encounter (Signed)
PT STATES SHE HAD A COMPLETE PE WITH DR Tamala Julian AND WAS TOLD TO CALL BACK TODAY SO THE DR COULD KNOW HOW SHE IS DOING AND PT WOULD LIKE A RETURN CALL BACK AT [phone]

## 2013-01-29 NOTE — Telephone Encounter (Signed)
Pt states she is not coughing as bad, but she is still wheezing. She finished up her abx and cough med. Please advise on what she needs to do next.

## 2013-01-29 NOTE — Telephone Encounter (Signed)
PT WAS TOLD TO CALL DR Tamala Julian AND LET HER KNOW HOW SHE IS DOING AND PT WOULD REALLY LIKE A RETURN CALL AT [phone] OR [phone]

## 2013-01-29 NOTE — Telephone Encounter (Signed)
Call ---- 1.  Is patient having any further fever?  2.  I am sending in rx for Prednisone for her to take for wheezing.  3.  Is she short of breath?  Is she worse?

## 2013-01-30 MED ORDER — PREDNISONE 20 MG PO TABS: ORAL_TABLET | ORAL | Status: DC

## 2013-01-30 NOTE — Telephone Encounter (Signed)
rx for prednisone was not sent in.  rx sent in and pt states that she will call back if no better

## 2013-01-30 NOTE — Telephone Encounter (Signed)
Spoke with pt, she is not SOB but still wheezing. She is not worse but she is still coughing. Pt understood. Advised RX ready to pick up at her pharmacy.

## 2013-02-07 DIAGNOSIS — L930 Discoid lupus erythematosus: Secondary | ICD-10-CM | POA: Diagnosis not present

## 2013-02-07 DIAGNOSIS — Z7189 Other specified counseling: Secondary | ICD-10-CM | POA: Diagnosis not present

## 2013-02-19 ENCOUNTER — Telehealth: Payer: Self-pay

## 2013-02-19 NOTE — Telephone Encounter (Signed)
Advised pt to rtc. Pt states she is concerned about traveling at this time due to the weather forecast. Advised pt to go to the Urgent Care in [place]. She sounds very SOB while talking. Her cough is also wheezy. She agrees and is headed to the Urgent Care now.

## 2013-02-19 NOTE — Telephone Encounter (Signed)
Patient states that she is not feeling any better after taking a Z-pack and Predisone. Can she be given something else?  [phone]

## 2013-02-20 ENCOUNTER — Emergency Department: Payer: Self-pay | Admitting: Emergency Medicine

## 2013-02-20 DIAGNOSIS — R1110 Vomiting, unspecified: Secondary | ICD-10-CM | POA: Diagnosis not present

## 2013-02-20 DIAGNOSIS — R197 Diarrhea, unspecified: Secondary | ICD-10-CM | POA: Diagnosis not present

## 2013-02-20 DIAGNOSIS — I10 Essential (primary) hypertension: Secondary | ICD-10-CM | POA: Diagnosis not present

## 2013-02-20 DIAGNOSIS — K219 Gastro-esophageal reflux disease without esophagitis: Secondary | ICD-10-CM | POA: Diagnosis not present

## 2013-02-20 LAB — CBC WITH DIFFERENTIAL/PLATELET
Basophil #: 0.1 10*3/uL (ref 0.0–0.1)
Basophil %: 0.6 %
EOS ABS: 0 10*3/uL (ref 0.0–0.7)
EOS PCT: 0.1 %
HCT: 41.4 % (ref 35.0–47.0)
HGB: 13.7 g/dL (ref 12.0–16.0)
LYMPHS PCT: 18.5 %
Lymphocyte #: 3.2 10*3/uL (ref 1.0–3.6)
MCH: 30.2 pg (ref 26.0–34.0)
MCHC: 33 g/dL (ref 32.0–36.0)
MCV: 92 fL (ref 80–100)
MONO ABS: 0.9 x10 3/mm (ref 0.2–0.9)
MONOS PCT: 5.3 %
NEUTROS ABS: 13 10*3/uL — AB (ref 1.4–6.5)
Neutrophil %: 75.5 %
PLATELETS: 259 10*3/uL (ref 150–440)
RBC: 4.53 10*6/uL (ref 3.80–5.20)
RDW: 14.6 % — AB (ref 11.5–14.5)
WBC: 17.2 10*3/uL — AB (ref 3.6–11.0)

## 2013-02-20 LAB — COMPREHENSIVE METABOLIC PANEL
ALBUMIN: 3.7 g/dL (ref 3.4–5.0)
ALK PHOS: 66 U/L
ALT: 22 U/L (ref 12–78)
Anion Gap: 7 (ref 7–16)
BUN: 13 mg/dL (ref 7–18)
Bilirubin,Total: 0.7 mg/dL (ref 0.2–1.0)
CO2: 30 mmol/L (ref 21–32)
Calcium, Total: 9.5 mg/dL (ref 8.5–10.1)
Chloride: 102 mmol/L (ref 98–107)
Creatinine: 0.94 mg/dL (ref 0.60–1.30)
EGFR (Non-African Amer.): >60
Glucose: 100 mg/dL — ABNORMAL HIGH (ref 65–99)
Osmolality: 278 (ref 275–301)
POTASSIUM: 3.1 mmol/L — AB (ref 3.5–5.1)
SGOT(AST): 19 U/L (ref 15–37)
SODIUM: 139 mmol/L (ref 136–145)
Total Protein: 8.4 g/dL — ABNORMAL HIGH (ref 6.4–8.2)

## 2013-02-20 LAB — URINALYSIS, COMPLETE
BACTERIA: NONE SEEN
BILIRUBIN, UR: NEGATIVE
BLOOD: NEGATIVE
Glucose,UR: NEGATIVE mg/dL (ref 0–75)
Ketone: NEGATIVE
LEUKOCYTE ESTERASE: NEGATIVE
Nitrite: NEGATIVE
Ph: 7 (ref 4.5–8.0)
Protein: NEGATIVE
Specific Gravity: 1.018 (ref 1.003–1.030)
WBC UR: 1 /HPF (ref 0–5)

## 2013-02-20 LAB — LIPASE, BLOOD: Lipase: 159 U/L (ref 73–393)

## 2013-02-20 NOTE — Telephone Encounter (Signed)
Noted and agree with plan as advised to patient.  No new orders.

## 2013-02-26 DIAGNOSIS — J019 Acute sinusitis, unspecified: Secondary | ICD-10-CM | POA: Diagnosis not present

## 2013-02-26 DIAGNOSIS — J0410 Acute tracheitis without obstruction: Secondary | ICD-10-CM | POA: Diagnosis not present

## 2013-03-20 ENCOUNTER — Other Ambulatory Visit: Payer: Self-pay

## 2013-03-20 MED ORDER — PANTOPRAZOLE SODIUM 40 MG PO TBEC: 40.0000 mg | DELAYED_RELEASE_TABLET | Freq: Every day | ORAL | Status: DC

## 2013-04-13 DIAGNOSIS — Z79899 Other long term (current) drug therapy: Secondary | ICD-10-CM | POA: Diagnosis not present

## 2013-04-13 DIAGNOSIS — M329 Systemic lupus erythematosus, unspecified: Secondary | ICD-10-CM | POA: Diagnosis not present

## 2013-04-13 DIAGNOSIS — T372X5A Adverse effect of antimalarials and drugs acting on other blood protozoa, initial encounter: Secondary | ICD-10-CM | POA: Diagnosis not present

## 2013-04-13 DIAGNOSIS — H269 Unspecified cataract: Secondary | ICD-10-CM | POA: Diagnosis not present

## 2013-04-25 ENCOUNTER — Other Ambulatory Visit: Payer: Self-pay | Admitting: Family Medicine

## 2013-04-28 ENCOUNTER — Other Ambulatory Visit: Payer: Self-pay | Admitting: Family Medicine

## 2013-05-09 DIAGNOSIS — Z79899 Other long term (current) drug therapy: Secondary | ICD-10-CM | POA: Diagnosis not present

## 2013-05-09 DIAGNOSIS — L930 Discoid lupus erythematosus: Secondary | ICD-10-CM | POA: Diagnosis not present

## 2013-05-09 DIAGNOSIS — L259 Unspecified contact dermatitis, unspecified cause: Secondary | ICD-10-CM | POA: Diagnosis not present

## 2013-05-09 DIAGNOSIS — M329 Systemic lupus erythematosus, unspecified: Secondary | ICD-10-CM | POA: Diagnosis not present

## 2013-05-10 NOTE — Telephone Encounter (Signed)
Faxed estratest Rx.

## 2013-05-21 ENCOUNTER — Ambulatory Visit (INDEPENDENT_AMBULATORY_CARE_PROVIDER_SITE_OTHER): Payer: 59 | Admitting: Family Medicine

## 2013-05-21 ENCOUNTER — Encounter: Payer: Self-pay | Admitting: Family Medicine

## 2013-05-21 VITALS — BP 120/90 | HR 62 | Temp 98.2°F | Resp 16 | Ht 61.75 in | Wt 184.4 lb

## 2013-05-21 DIAGNOSIS — I10 Essential (primary) hypertension: Secondary | ICD-10-CM

## 2013-05-21 DIAGNOSIS — I878 Other specified disorders of veins: Secondary | ICD-10-CM

## 2013-05-21 DIAGNOSIS — I872 Venous insufficiency (chronic) (peripheral): Secondary | ICD-10-CM | POA: Diagnosis not present

## 2013-05-21 DIAGNOSIS — R7309 Other abnormal glucose: Secondary | ICD-10-CM | POA: Diagnosis not present

## 2013-05-21 LAB — CBC WITH DIFFERENTIAL/PLATELET
BASOS PCT: 0 % (ref 0–1)
Basophils Absolute: 0 10*3/uL (ref 0.0–0.1)
Eosinophils Absolute: 0.2 10*3/uL (ref 0.0–0.7)
Eosinophils Relative: 2 % (ref 0–5)
HCT: 38.1 % (ref 36.0–46.0)
HEMOGLOBIN: 13.6 g/dL (ref 12.0–15.0)
Lymphocytes Relative: 38 % (ref 12–46)
Lymphs Abs: 3 10*3/uL (ref 0.7–4.0)
MCH: 31 pg (ref 26.0–34.0)
MCHC: 35.7 g/dL (ref 30.0–36.0)
MCV: 86.8 fL (ref 78.0–100.0)
MONOS PCT: 8 % (ref 3–12)
Monocytes Absolute: 0.6 10*3/uL (ref 0.1–1.0)
NEUTROS ABS: 4.1 10*3/uL (ref 1.7–7.7)
NEUTROS PCT: 52 % (ref 43–77)
PLATELETS: 258 10*3/uL (ref 150–400)
RBC: 4.39 MIL/uL (ref 3.87–5.11)
RDW: 13.8 % (ref 11.5–15.5)
WBC: 7.9 10*3/uL (ref 4.0–10.5)

## 2013-05-21 LAB — COMPREHENSIVE METABOLIC PANEL
ALK PHOS: 47 U/L (ref 39–117)
ALT: 13 U/L (ref 0–35)
AST: 27 U/L (ref 0–37)
Albumin: 4.2 g/dL (ref 3.5–5.2)
BUN: 13 mg/dL (ref 6–23)
CO2: 29 mEq/L (ref 19–32)
Calcium: 9.2 mg/dL (ref 8.4–10.5)
Chloride: 101 mEq/L (ref 96–112)
Creat: 0.77 mg/dL (ref 0.50–1.10)
GLUCOSE: 87 mg/dL (ref 70–99)
Potassium: 3.2 mEq/L — ABNORMAL LOW (ref 3.5–5.3)
SODIUM: 140 meq/L (ref 135–145)
TOTAL PROTEIN: 6.8 g/dL (ref 6.0–8.3)
Total Bilirubin: 0.5 mg/dL (ref 0.2–1.2)

## 2013-05-21 LAB — HEMOGLOBIN A1C
Hgb A1c MFr Bld: 6.3 % — ABNORMAL HIGH (ref <5.7)
MEAN PLASMA GLUCOSE: 134 mg/dL — AB (ref <117)

## 2013-05-21 MED ORDER — FUROSEMIDE 40 MG PO TABS: 40.0000 mg | ORAL_TABLET | Freq: Every day | ORAL | Status: DC

## 2013-05-21 MED ORDER — EST ESTROGENS-METHYLTEST 1.25-2.5 MG PO TABS: 1.0000 | ORAL_TABLET | Freq: Every day | ORAL | Status: DC

## 2013-05-21 NOTE — Progress Notes (Signed)
Subjective:    Patient ID: Maureen Hodge, female    DOB: 08/02/1950, 63 y.o.   MRN: [id number]  HPI Chief Complaint  Patient presents with  . Follow-up    BP/abnormal blood sugars   This chart was scribed for Wardell Honour, MD by Thea Alken, ED Scribe. This patient was seen in room 28 and the patient's care was started at 12:13 PM.  HPI Comments: Maureen Hodge is a 63 y.o. female who [facility] the [facility] for a follow-up regarding HTN and glucose intolerance.  Pt reports her BP has been fine. Her BP typically runs 130/85 at highest and 120 at its lowest. Pt states her ankles have been swollen and worsen with heat/warm weather. Pt is requesting a refill of Lasix. Pt has been watching sugar in her diet. She reports she is trying to exercise. 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 Pt visits her neurologist once a year and her next appointment is next month. Pt reports GERD in which she takes protonix.  Pt c/o constant sweats and is concern it may be from medication.   Pt denies allergies. She denies sneezing, CP,  SOB, cough, palpitations, bowel incontinence. Pt has not received the shingles vaccine.   Past Medical History  Diagnosis Date  . Migraines   . Hypertension   . PLMD (periodic limb movement disorder)   . Diabetes mellitus     Pre-diabetes  . Brain tumor 05/2011    s/p resection  [facility]  . Lupus erythematosus   . Diffuse cystic mastopathy   . Other convulsions   . Intervertebral disc disorder with myelopathy, unspecified region   . Cerebral aneurysm, nonruptured   . Esophageal reflux   . Hypopotassemia 06/01/2007  . Unspecified sleep apnea     CPAP titrated to 7cm  . Allergy    Allergies  Allergen Reactions  . Lisinopril Swelling    Face and lips   Prior to Admission medications   Medication Sig Start Date End Date Taking? Authorizing Provider  atenolol (TENORMIN) 25 MG tablet Two tablets every morning, one  tablet q evening 01/23/13  Yes Wardell Honour, MD  estrogen-methylTESTOSTERone (ESTRATEST) 1.25-2.5 MG per tablet TAKE 1 TABLET EVERY DAY   Yes Wardell Honour, MD  gabapentin (NEURONTIN) 800 MG tablet Take 1 tablet (800 mg total) by mouth 4 (four) times daily. 01/23/13  Yes Wardell Honour, MD  HYDROcodone-acetaminophen (NORCO) 7.5-325 MG per tablet Take 1 tablet by mouth every 6 (six) hours as needed for moderate pain. 01/23/13  Yes Wardell Honour, MD  hydroxychloroquine (PLAQUENIL) 200 MG tablet Take 200 mg by mouth 2 (two) times daily.     Yes Historical Provider, MD  mometasone (ELOCON) 0.1 % ointment Apply topically Twice daily as needed. 11/09/11  Yes Historical Provider, MD  nitroGLYCERIN (NITROSTAT) 0.4 MG SL tablet Place 1 tablet (0.4 mg total) under the tongue every 5 (five) minutes as needed for chest pain. 01/23/13  Yes Wardell Honour, MD  nortriptyline (PAMELOR) 25 MG capsule Take 3 capsules (75 mg total) by mouth at bedtime. 01/23/13  Yes Wardell Honour, MD  nortriptyline (PAMELOR) 25 MG capsule TAKE ONE CAPSULE BY MOUTH 3 TIMES A DAY   Yes Wardell Honour, MD  pantoprazole (PROTONIX) 40 MG tablet Take 1 tablet (40 mg total) by mouth daily. 03/20/13  Yes Wardell Honour, MD  potassium chloride SA (K-DUR,KLOR-CON) 20 MEQ tablet Take  2 tablets (40 mEq total) by mouth 2 (two) times daily. Takes 2 tablets twice daily. 01/23/13  Yes Wardell Honour, MD  triamterene-hydrochlorothiazide (MAXZIDE-25) 37.5-25 MG per tablet Take 1 tablet by mouth daily. 01/23/13  Yes Wardell Honour, MD  furosemide (LASIX) 40 MG tablet Take 1 tablet (40 mg total) by mouth daily. 01/23/13   Wardell Honour, MD  zoster vaccine live, PF, (ZOSTAVAX) 61443 UNT/0.65ML injection Inject 19,400 Units into the skin once. 01/23/13   Wardell Honour, MD    Review of Systems  Constitutional: Positive for diaphoresis. Negative for fever, chills and fatigue.  Eyes: Negative for visual disturbance.  Respiratory: Negative for cough, chest  tightness and shortness of breath.   Cardiovascular: Positive for leg swelling. Negative for chest pain and palpitations.  Gastrointestinal: Negative for nausea, vomiting, abdominal pain, diarrhea, constipation and blood in stool.  Endocrine: Negative for cold intolerance, heat intolerance, polydipsia, polyphagia and polyuria.  Neurological: Negative for dizziness, tremors, seizures, syncope, facial asymmetry, speech difficulty, weakness, light-headedness, numbness and headaches.       Objective:   Physical Exam  Constitutional: She is oriented to person, place, and time. She appears well-developed and well-nourished. No distress.  HENT:  Head: Normocephalic and atraumatic.  Right Ear: External ear normal.  Left Ear: External ear normal.  Nose: Nose normal.  Mouth/Throat: Oropharynx is clear and moist.  Eyes: Conjunctivae and EOM are normal. Pupils are equal, round, and reactive to light.  Neck: Normal range of motion. Neck supple. Carotid bruit is not present. No tracheal deviation present. No thyromegaly present.  Cardiovascular: Normal rate, regular rhythm, normal heart sounds and intact distal pulses.  Exam reveals no gallop and no friction rub.   No murmur heard. Pulmonary/Chest: Effort normal and breath sounds normal. No respiratory distress. She has no wheezes. She has no rales. She exhibits no tenderness.  Abdominal: Soft. Bowel sounds are normal. She exhibits no distension and no mass. There is no tenderness. There is no rebound and no guarding.  Musculoskeletal: Normal range of motion.  Lymphadenopathy:    She has no cervical adenopathy.  Neurological: She is alert and oriented to person, place, and time. No cranial nerve deficit.  Skin: Skin is warm and dry. No rash noted. She is not diaphoretic. No erythema. No pallor.  Psychiatric: She has a normal mood and affect. Her behavior is normal.  Nursing note and vitals reviewed.      Assessment & Plan:   1. Venous stasis     2. Essential hypertension, benign   3. Other abnormal glucose     1. Venous stasis: worsening due to warmer months; refill of Lasix provided; take KCL with Lasix. Obtain labs. 2. HTN: controlled; obtain labs; continue current medications. 3.  Glucose intolerance: stable; recommend weight loss, exercise, low-sugar food choices. 4.  GERD: controlled with Protonix. 5. Excessive sweating: chronic; Testosterone may be contributing; obtain labs.  Obesity also contributing.   Meds ordered this encounter  Medications  . furosemide (LASIX) 40 MG tablet    Sig: Take 1 tablet (40 mg total) by mouth daily.    Dispense:  90 tablet    Refill:  1  . estrogen-methylTESTOSTERone (ESTRATEST) 1.25-2.5 MG per tablet    Sig: Take 1 tablet by mouth daily.    Dispense:  30 tablet    Refill:  5     I personally performed the services described in this documentation, which was scribed in my presence.  The recorded information has  been reviewed and is accurate.  Reginia Forts, M.D.  [facility] [address] [place], [place]  [address] [phone] phone [phone] fax

## 2013-06-05 DIAGNOSIS — Z79899 Other long term (current) drug therapy: Secondary | ICD-10-CM | POA: Diagnosis not present

## 2013-06-05 DIAGNOSIS — R209 Unspecified disturbances of skin sensation: Secondary | ICD-10-CM | POA: Diagnosis not present

## 2013-06-05 DIAGNOSIS — D320 Benign neoplasm of cerebral meninges: Secondary | ICD-10-CM | POA: Diagnosis not present

## 2013-06-27 ENCOUNTER — Ambulatory Visit: Payer: Medicare Other | Admitting: Family Medicine

## 2013-07-04 DIAGNOSIS — M79609 Pain in unspecified limb: Secondary | ICD-10-CM | POA: Diagnosis not present

## 2013-07-04 DIAGNOSIS — M2010 Hallux valgus (acquired), unspecified foot: Secondary | ICD-10-CM | POA: Diagnosis not present

## 2013-08-13 ENCOUNTER — Telehealth: Payer: Self-pay | Admitting: Family Medicine

## 2013-08-13 MED ORDER — HYDROCODONE-ACETAMINOPHEN 7.5-325 MG PO TABS: 1.0000 | ORAL_TABLET | Freq: Four times a day (QID) | ORAL | Status: DC | PRN

## 2013-08-13 NOTE — Telephone Encounter (Signed)
Patient calling requesting refill of Hydrocodone for chronic headaches.  Last refill 01/2013.  Rx approved and provided to husband, Erskin Seiden.

## 2013-08-17 DIAGNOSIS — N6019 Diffuse cystic mastopathy of unspecified breast: Secondary | ICD-10-CM | POA: Diagnosis not present

## 2013-09-14 LAB — HM MAMMOGRAPHY

## 2013-09-24 ENCOUNTER — Encounter: Payer: Self-pay | Admitting: Family Medicine

## 2013-09-24 ENCOUNTER — Ambulatory Visit (INDEPENDENT_AMBULATORY_CARE_PROVIDER_SITE_OTHER): Payer: 59 | Admitting: Family Medicine

## 2013-09-24 VITALS — BP 130/90 | HR 67 | Temp 97.9°F | Resp 16 | Ht 61.5 in | Wt 186.0 lb

## 2013-09-24 DIAGNOSIS — R7309 Other abnormal glucose: Secondary | ICD-10-CM | POA: Diagnosis not present

## 2013-09-24 DIAGNOSIS — I10 Essential (primary) hypertension: Secondary | ICD-10-CM | POA: Diagnosis not present

## 2013-09-24 DIAGNOSIS — L309 Dermatitis, unspecified: Secondary | ICD-10-CM

## 2013-09-24 DIAGNOSIS — M21619 Bunion of unspecified foot: Secondary | ICD-10-CM

## 2013-09-24 DIAGNOSIS — Z23 Encounter for immunization: Secondary | ICD-10-CM | POA: Diagnosis not present

## 2013-09-24 DIAGNOSIS — R209 Unspecified disturbances of skin sensation: Secondary | ICD-10-CM

## 2013-09-24 DIAGNOSIS — M21611 Bunion of right foot: Secondary | ICD-10-CM

## 2013-09-24 DIAGNOSIS — R202 Paresthesia of skin: Secondary | ICD-10-CM

## 2013-09-24 DIAGNOSIS — L259 Unspecified contact dermatitis, unspecified cause: Secondary | ICD-10-CM

## 2013-09-24 DIAGNOSIS — R200 Anesthesia of skin: Secondary | ICD-10-CM

## 2013-09-24 LAB — HEMOGLOBIN A1C
Hgb A1c MFr Bld: 6.4 % — ABNORMAL HIGH (ref <5.7)
MEAN PLASMA GLUCOSE: 137 mg/dL — AB (ref <117)

## 2013-09-24 LAB — COMPLETE METABOLIC PANEL WITH GFR
ALBUMIN: 4.1 g/dL (ref 3.5–5.2)
ALK PHOS: 60 U/L (ref 39–117)
ALT: 10 U/L (ref 0–35)
AST: 18 U/L (ref 0–37)
BUN: 15 mg/dL (ref 6–23)
CALCIUM: 8.9 mg/dL (ref 8.4–10.5)
CO2: 25 mEq/L (ref 19–32)
Chloride: 106 mEq/L (ref 96–112)
Creat: 0.89 mg/dL (ref 0.50–1.10)
GFR, EST NON AFRICAN AMERICAN: 70 mL/min
GFR, Est African American: 81 mL/min
GLUCOSE: 87 mg/dL (ref 70–99)
POTASSIUM: 3.7 meq/L (ref 3.5–5.3)
Sodium: 140 mEq/L (ref 135–145)
Total Bilirubin: 0.5 mg/dL (ref 0.2–1.2)
Total Protein: 6.6 g/dL (ref 6.0–8.3)

## 2013-09-24 LAB — CBC WITH DIFFERENTIAL/PLATELET
BASOS PCT: 0 % (ref 0–1)
Basophils Absolute: 0 10*3/uL (ref 0.0–0.1)
EOS PCT: 2 % (ref 0–5)
Eosinophils Absolute: 0.2 10*3/uL (ref 0.0–0.7)
HEMATOCRIT: 39 % (ref 36.0–46.0)
HEMOGLOBIN: 13.4 g/dL (ref 12.0–15.0)
Lymphocytes Relative: 31 % (ref 12–46)
Lymphs Abs: 2.6 10*3/uL (ref 0.7–4.0)
MCH: 30.6 pg (ref 26.0–34.0)
MCHC: 34.4 g/dL (ref 30.0–36.0)
MCV: 89 fL (ref 78.0–100.0)
MONO ABS: 0.9 10*3/uL (ref 0.1–1.0)
MONOS PCT: 10 % (ref 3–12)
NEUTROS ABS: 4.8 10*3/uL (ref 1.7–7.7)
Neutrophils Relative %: 57 % (ref 43–77)
Platelets: 257 10*3/uL (ref 150–400)
RBC: 4.38 MIL/uL (ref 3.87–5.11)
RDW: 13.3 % (ref 11.5–15.5)
WBC: 8.5 10*3/uL (ref 4.0–10.5)

## 2013-09-24 NOTE — Progress Notes (Signed)
Subjective:    Patient ID: Maureen Hodge, female    DOB: 11-Dec-1950, 63 y.o.   MRN: [id number]  09/24/2013  Follow-up, Hyperglycemia and Hypertension   HPI This 63 y.o. female presents for four month follow-up of HTN, glucose intolerance, venous stasis.    1. HTN: four month follow-up; no changes to management made at last visit.   Blood pressure highest at home of 130/90.  White coat syndrome.  No headaches, dizziness.  Patient reports good compliance with medication, good tolerance to medication, and good symptom control.    2.  Glucose Intolerance:  Avoids regular sodas; drinks mostly water.  Not exercising; weight is up two pounds from last visit.  3.  Rash R thigh:  Nancy Fetter was burning R thigh through jeans.  [place] [employer] [place] at race, blistering rash R thigh.  No numbness or tingling around rash.  Black jeans.     4.  R hand tingling:  Discussed with neurologist; was going to refer for a study.  Discussed with neurologist in May.  All fingers are numb.  Can pick up things but sometimes weak.  Onset before surgery; numbness went away after surgery.  No neck pain.  Not ready for NCS at this time.    5.  R foot bunion:  Wore tennis shoes yesterday.  Bunion is killing pt.  Has a podiatrist; Dr. Caryl Comes who performed L bunionectomy.  Saw Dr. Caryl Comes one month ago.  Using bunion pad but not working completely.    6. Headache chronic: has suffered only 4 headaches since brain surgery two years ago.  Has not followed up with [facility] because headaches have nearly resolved.  7. Aneurysm and meningioma: s/p brain surgery x 2;  Followed by [facility] yearly.   Review of Systems  Constitutional: Negative for fever, chills, diaphoresis and fatigue.  Eyes: Negative for visual disturbance.  Respiratory: Negative for cough and shortness of breath.   Cardiovascular: Negative for chest pain, palpitations and leg swelling.  Gastrointestinal: Negative for nausea, vomiting, abdominal  pain, diarrhea and constipation.  Endocrine: Negative for cold intolerance, heat intolerance, polydipsia, polyphagia and polyuria.  Musculoskeletal: Positive for arthralgias and joint swelling. Negative for neck pain and neck stiffness.  Skin: Positive for color change and rash.  Neurological: Positive for numbness. Negative for dizziness, tremors, seizures, syncope, facial asymmetry, speech difficulty, weakness, light-headedness and headaches.    Past Medical History  Diagnosis Date  . Migraines   . Hypertension   . PLMD (periodic limb movement disorder)   . Diabetes mellitus     Pre-diabetes  . Brain tumor 05/2011    s/p resection  [facility]  . Lupus erythematosus   . Bronchitis, not specified as acute or chronic   . Diffuse cystic mastopathy   . Allergic rhinitis, cause unspecified   . Other convulsions   . Intervertebral disc disorder with myelopathy, unspecified region   . Cerebral aneurysm, nonruptured   . Esophageal reflux   . Hypopotassemia 06/01/2007  . Unspecified sleep apnea     CPAP titrated to 7cm   Past Surgical History  Procedure Laterality Date  . Total abdominal hysterectomy  1979    postpartum hemorrhage ovaries intact  . Dilation and curettage of uterus      x 5  . Hemorrhoid surgery    . Breast surgery      x 5 due to fibrocystic disease  . Foot surgery    . Brain surgery  2013  .  Lumbar surgery  2000  . Brain avm repair    . Tonsillectomy and adenoidectomy    . Spine surgery     Allergies  Allergen Reactions  . Lisinopril Swelling    Face and lips   Current Outpatient Prescriptions  Medication Sig Dispense Refill  . atenolol (TENORMIN) 25 MG tablet Two tablets every morning, one tablet q evening  270 tablet  3  . estrogen-methylTESTOSTERone (ESTRATEST) 1.25-2.5 MG per tablet Take 1 tablet by mouth daily.  30 tablet  5  . furosemide (LASIX) 40 MG tablet Take 1 tablet (40 mg total) by mouth daily.  90 tablet  1  . gabapentin (NEURONTIN)  800 MG tablet Take 1 tablet (800 mg total) by mouth 4 (four) times daily.  360 tablet  3  . HYDROcodone-acetaminophen (NORCO) 7.5-325 MG per tablet Take 1 tablet by mouth every 6 (six) hours as needed for moderate pain.  40 tablet  0  . hydroxychloroquine (PLAQUENIL) 200 MG tablet Take 200 mg by mouth 2 (two) times daily.        . mometasone (ELOCON) 0.1 % ointment Apply topically Twice daily as needed.      . nitroGLYCERIN (NITROSTAT) 0.4 MG SL tablet Place 1 tablet (0.4 mg total) under the tongue every 5 (five) minutes as needed for chest pain.  30 tablet  0  . nortriptyline (PAMELOR) 25 MG capsule Take 3 capsules (75 mg total) by mouth at bedtime.  270 capsule  3  . pantoprazole (PROTONIX) 40 MG tablet Take 1 tablet (40 mg total) by mouth daily.  90 tablet  2  . potassium chloride SA (K-DUR,KLOR-CON) 20 MEQ tablet Take 2 tablets (40 mEq total) by mouth 2 (two) times daily. Takes 2 tablets twice daily.  360 tablet  3  . triamterene-hydrochlorothiazide (MAXZIDE-25) 37.5-25 MG per tablet Take 1 tablet by mouth daily.  90 tablet  3  . zoster vaccine live, PF, (ZOSTAVAX) 25427 UNT/0.65ML injection Inject 19,400 Units into the skin once.  1 each  0   No current facility-administered medications for this visit.       Objective:    BP 130/90  Pulse 67  Temp(Src) 97.9 F (36.6 C) (Oral)  Resp 16  Ht 5' 1.5" (1.562 m)  Wt 186 lb (84.369 kg)  BMI 34.58 kg/m2  SpO2 97% Physical Exam  Constitutional: She is oriented to person, place, and time. She appears well-developed and well-nourished. No distress.  HENT:  Head: Normocephalic and atraumatic.  Right Ear: External ear normal.  Left Ear: External ear normal.  Nose: Nose normal.  Mouth/Throat: Oropharynx is clear and moist.  Eyes: Conjunctivae and EOM are normal. Pupils are equal, round, and reactive to light.  Neck: Normal range of motion. Neck supple. Carotid bruit is not present. No thyromegaly present.  Cardiovascular: Normal rate,  regular rhythm, normal heart sounds and intact distal pulses.  Exam reveals no gallop and no friction rub.   No murmur heard. Pulmonary/Chest: Effort normal and breath sounds normal. She has no wheezes. She has no rales.  Abdominal: Soft. Bowel sounds are normal. She exhibits no distension and no mass. There is no tenderness. There is no rebound and no guarding.  Musculoskeletal:       Right wrist: Normal. She exhibits normal range of motion, no tenderness and no bony tenderness.       Right hand: Normal. She exhibits normal range of motion and no swelling. Normal sensation noted. Normal strength noted.  R foot large bunion  with erythema at first MTP.  Lymphadenopathy:    She has no cervical adenopathy.  Neurological: She is alert and oriented to person, place, and time. No cranial nerve deficit.  Skin: Skin is warm and dry. Rash noted. She is not diaphoretic. No erythema. No pallor.  Erythematous rash R thigh with three small scattered vesicles.  No pustules; no induration.  Scaling B ears external.  Psychiatric: She has a normal mood and affect. Her behavior is normal.   Results for orders placed in visit on 05/21/13  CBC WITH DIFFERENTIAL      Result Value Ref Range   WBC 7.9  4.0 - 10.5 K/uL   RBC 4.39  3.87 - 5.11 MIL/uL   Hemoglobin 13.6  12.0 - 15.0 g/dL   HCT 38.1  36.0 - 46.0 %   MCV 86.8  78.0 - 100.0 fL   MCH 31.0  26.0 - 34.0 pg   MCHC 35.7  30.0 - 36.0 g/dL   RDW 13.8  11.5 - 15.5 %   Platelets 258  150 - 400 K/uL   Neutrophils Relative % 52  43 - 77 %   Neutro Abs 4.1  1.7 - 7.7 K/uL   Lymphocytes Relative 38  12 - 46 %   Lymphs Abs 3.0  0.7 - 4.0 K/uL   Monocytes Relative 8  3 - 12 %   Monocytes Absolute 0.6  0.1 - 1.0 K/uL   Eosinophils Relative 2  0 - 5 %   Eosinophils Absolute 0.2  0.0 - 0.7 K/uL   Basophils Relative 0  0 - 1 %   Basophils Absolute 0.0  0.0 - 0.1 K/uL   Smear Review Criteria for review not met    COMPREHENSIVE METABOLIC PANEL      Result Value  Ref Range   Sodium 140  135 - 145 mEq/L   Potassium 3.2 (*) 3.5 - 5.3 mEq/L   Chloride 101  96 - 112 mEq/L   CO2 29  19 - 32 mEq/L   Glucose, Bld 87  70 - 99 mg/dL   BUN 13  6 - 23 mg/dL   Creat 0.77  0.50 - 1.10 mg/dL   Total Bilirubin 0.5  0.2 - 1.2 mg/dL   Alkaline Phosphatase 47  39 - 117 U/L   AST 27  0 - 37 U/L   ALT 13  0 - 35 U/L   Total Protein 6.8  6.0 - 8.3 g/dL   Albumin 4.2  3.5 - 5.2 g/dL   Calcium 9.2  8.4 - 10.5 mg/dL  HEMOGLOBIN A1C      Result Value Ref Range   Hemoglobin A1C 6.3 (*) <5.7 %   Mean Plasma Glucose 134 (*) <117 mg/dL   INFLUENZA VACCINE ADMINISTERED.    Assessment & Plan:   1. Essential hypertension, benign   2. Other abnormal glucose   3. Bunion, right   4. Numbness and tingling in right hand   5. Dermatitis   6. Need for prophylactic vaccination and inoculation against influenza     1. HTN: controlled; white coat syndrome with elevated readings in office but normal home readings; obtain labs.  Continue Maxzide and Atenolol at current doses. Continue KCL supplementation. 2.  Glucose Intolerance:  Stable; recommend weight loss and exercise.   3.  R foot bunion: worsening; s/p consultation by Dr. Caryl Comes but desires second opinion; will refer to podiatry.  Recommend supportive shoes, icing, elevation. 4.  R hand tingling/numbness: onset two years  ago with recent worsening; declined referral for NCS at this time.  Will scheduled NCS once bunions have been addressed. 5.  Dermatitis versus burn R thigh:  New.  Onset after prolonged sun and heat exposure; local wound care; recommend applying Elocon bid for two weeks.  RTC for acute worsening. 6.  S/p flu vacccine.  No orders of the defined types were placed in this encounter.    Return in about 4 months (around 01/24/2014) for complete physical examiniation.    Reginia Forts, M.D.  [facility] [address] [place], [place]  [address] [phone] phone [phone] fax

## 2013-09-25 ENCOUNTER — Ambulatory Visit (INDEPENDENT_AMBULATORY_CARE_PROVIDER_SITE_OTHER): Payer: 59

## 2013-09-25 ENCOUNTER — Ambulatory Visit (INDEPENDENT_AMBULATORY_CARE_PROVIDER_SITE_OTHER): Payer: 59 | Admitting: Podiatry

## 2013-09-25 ENCOUNTER — Encounter: Payer: Self-pay | Admitting: Podiatry

## 2013-09-25 VITALS — BP 120/76 | HR 67 | Resp 16 | Ht 61.0 in | Wt 185.0 lb

## 2013-09-25 DIAGNOSIS — M21611 Bunion of right foot: Secondary | ICD-10-CM

## 2013-09-25 DIAGNOSIS — M2040 Other hammer toe(s) (acquired), unspecified foot: Secondary | ICD-10-CM

## 2013-09-25 DIAGNOSIS — M21619 Bunion of unspecified foot: Secondary | ICD-10-CM

## 2013-09-25 NOTE — Progress Notes (Signed)
   Subjective:    Patient ID: Maureen Hodge, female    DOB: [birth year], 64 y.o.   MRN: [id number]  HPI Comments: i have a bunion on my rt foot. Its very painful. Ive had it for years. Its getting worse. Sometimes it will hurt to walk and stand. Shoes hurt my foot. Ive used bunion pads. i have bought a better shoe and wear sandals.   Foot Pain Associated symptoms include headaches.      Review of Systems  Cardiovascular: Positive for leg swelling.  Musculoskeletal:       Joint pain Back pain  Neurological: Positive for headaches.  All other systems reviewed and are negative.      Objective:   Physical Exam        Assessment & Plan:

## 2013-09-25 NOTE — Progress Notes (Signed)
Subjective:     Patient ID: Maureen Hodge, female   DOB: 03/14/1950, 63 y.o.   MRN: [id number]  Foot Pain   patient presents stating I have a very painful bunion on my right foot of several months duration but it's been really bothering me for years and the fifth toe on my right foot get sore. Also I have had my left bunion fixed a number of years ago and it is doing fine   Review of Systems  All other systems reviewed and are negative.      Objective:   Physical Exam  Nursing note and vitals reviewed. Constitutional: She is oriented to person, place, and time.  Cardiovascular: Intact distal pulses.   Musculoskeletal: Normal range of motion.  Neurological: She is oriented to person, place, and time.  Skin: Skin is warm.   neurovascular status found to be intact with muscle strength adequate and range of motion of the subtalar and midtarsal joint within normal limits. Patient's digits are found to be well perfused moderate diminishment of arch height upon weightbearing and she is well oriented x3. Patient is found to have a large hyperostosis with redness surrounding the first metatarsal head right and pain when palpated and a keratotic lesion fifth digit right that is painful when pressed. 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 I spent a great deal of time educating her on this and she states that she wants to get it fixed and needs to have it done soon due to her schedule. At this point I reviewed with her what would be required and since she has had the other foot fixed I did allow her to read the consent form today. Patient reviewed the consent form and we went over all possible complications and the fact that total recovery from this type her for procedure can take 6 months to  one year. Patient is scheduled for [facility] with pin fixation and arthroplasty fifth toe right foot. Patient is dispensed air fracture walker with all instructions on usage and all preoperative instructions and is encouraged to call with any questions prior to surgery which is scheduled for next week

## 2013-09-26 ENCOUNTER — Encounter: Payer: Self-pay | Admitting: Radiology

## 2013-09-28 DIAGNOSIS — Z9889 Other specified postprocedural states: Secondary | ICD-10-CM

## 2013-09-28 HISTORY — DX: Other specified postprocedural states: Z98.890

## 2013-10-02 ENCOUNTER — Encounter: Payer: Self-pay | Admitting: Podiatry

## 2013-10-02 DIAGNOSIS — I10 Essential (primary) hypertension: Secondary | ICD-10-CM | POA: Diagnosis not present

## 2013-10-02 DIAGNOSIS — M2010 Hallux valgus (acquired), unspecified foot: Secondary | ICD-10-CM | POA: Diagnosis not present

## 2013-10-02 DIAGNOSIS — M2040 Other hammer toe(s) (acquired), unspecified foot: Secondary | ICD-10-CM | POA: Diagnosis not present

## 2013-10-04 ENCOUNTER — Telehealth: Payer: Self-pay | Admitting: *Deleted

## 2013-10-04 ENCOUNTER — Encounter: Payer: Self-pay | Admitting: Podiatry

## 2013-10-04 NOTE — Telephone Encounter (Signed)
Called patient , patient not having a lot of pain , she did have some questions regarding her surgery . Is to sleep with her boot and ice and elevate till she comes back in for her surgery check

## 2013-10-04 NOTE — Progress Notes (Signed)
[place] bunionectomy right foot with pin fixation  Hammertoe repair 5th right

## 2013-10-08 ENCOUNTER — Other Ambulatory Visit: Payer: Self-pay | Admitting: *Deleted

## 2013-10-08 ENCOUNTER — Telehealth: Payer: Self-pay | Admitting: *Deleted

## 2013-10-08 MED ORDER — OXYCODONE-ACETAMINOPHEN 5-325 MG PO TABS: ORAL_TABLET | ORAL | Status: DC

## 2013-10-08 NOTE — Telephone Encounter (Signed)
PER DR HYATT PT CAN HAVE PERCOCET 5/325 #40 O REFILLS. TAKE ONE TO TWO TABLETS BY MOUTH EVERY 8 HRS AS NEEDED FOR PAIN. SPOKE WITH PT, STATED SHE WOULD SEND HER HUSBAND OVER TO PICK UP PERCOCET RX.

## 2013-10-08 NOTE — Telephone Encounter (Signed)
I had surgery on my foot on Tuesday.  My foot is hurting right much and my pain pills, I don't have but one left.  I was going to see if you could call me in a prescription.

## 2013-10-12 ENCOUNTER — Encounter: Payer: Self-pay | Admitting: *Deleted

## 2013-10-12 ENCOUNTER — Ambulatory Visit (INDEPENDENT_AMBULATORY_CARE_PROVIDER_SITE_OTHER): Payer: 59

## 2013-10-12 ENCOUNTER — Ambulatory Visit (INDEPENDENT_AMBULATORY_CARE_PROVIDER_SITE_OTHER): Payer: 59 | Admitting: Podiatry

## 2013-10-12 VITALS — BP 128/92 | HR 71 | Resp 16

## 2013-10-12 DIAGNOSIS — M2011 Hallux valgus (acquired), right foot: Secondary | ICD-10-CM | POA: Diagnosis not present

## 2013-10-12 DIAGNOSIS — M21611 Bunion of right foot: Secondary | ICD-10-CM

## 2013-10-12 NOTE — Progress Notes (Signed)
Subjective:     Patient ID: Maureen Hodge, female   DOB: 1950/10/18, 63 y.o.   MRN: [id number]  HPI patient states that I'm feeling fine and have some pain but overall it's not bad   Review of Systems     Objective:   Physical Exam  Neurovascular status intact muscle strength adequate with range of motion of the subtalar and midtarsal joint within normal limits. Negative Homans sign noted well-healed surgical site with wound edges well coapted first MPJ and fifth    Assessment:     Doing very well postop bunion and hammertoe correction right    Plan:     Reviewed x-rays and allow patient to begin wearing surgical shoe. Explain range of motion exercises continued compression elevation and reappoint 2 weeks for suture removal

## 2013-10-25 ENCOUNTER — Ambulatory Visit (INDEPENDENT_AMBULATORY_CARE_PROVIDER_SITE_OTHER): Payer: 59 | Admitting: *Deleted

## 2013-10-25 ENCOUNTER — Encounter: Payer: Self-pay | Admitting: Podiatry

## 2013-10-25 VITALS — BP 112/72 | HR 68 | Resp 16

## 2013-10-25 DIAGNOSIS — M21611 Bunion of right foot: Secondary | ICD-10-CM

## 2013-10-25 DIAGNOSIS — M2011 Hallux valgus (acquired), right foot: Secondary | ICD-10-CM

## 2013-10-25 NOTE — Progress Notes (Signed)
Pt presents for suture removal. Pt has appt to follow up on 11.3.15 to see dr Paulla Dolly. Maureen Hodge pt anklet and instructions on how to wear it.

## 2013-10-26 ENCOUNTER — Other Ambulatory Visit: Payer: 59

## 2013-11-06 ENCOUNTER — Encounter: Payer: 59 | Admitting: Podiatry

## 2013-11-09 ENCOUNTER — Ambulatory Visit (INDEPENDENT_AMBULATORY_CARE_PROVIDER_SITE_OTHER): Payer: 59 | Admitting: Podiatry

## 2013-11-09 ENCOUNTER — Encounter: Payer: Self-pay | Admitting: Podiatry

## 2013-11-09 ENCOUNTER — Ambulatory Visit (INDEPENDENT_AMBULATORY_CARE_PROVIDER_SITE_OTHER): Payer: 59

## 2013-11-09 VITALS — BP 120/76 | HR 66 | Resp 16

## 2013-11-09 DIAGNOSIS — M21611 Bunion of right foot: Secondary | ICD-10-CM

## 2013-11-09 DIAGNOSIS — M2011 Hallux valgus (acquired), right foot: Secondary | ICD-10-CM | POA: Diagnosis not present

## 2013-11-09 NOTE — Progress Notes (Signed)
Subjective:     Patient ID: Maureen Hodge, female   DOB: [birth year], 63 y.o.   MRN: [id number]  HPIpatient states her foot is doing fine but she still having trouble fitting into shoes. Having minimal discomfort   Review of Systems     Objective:   Physical Exam Neurovascular status intact with excellent healing of the first metatarsal fifth digit with good alignment and good range of motion first MPJ with no pain and mild swelling noted    Assessment:     Healing well after having foot surgery right foot    Plan:     Advised on continued range of motion exercises and return to saw shoe gear. Reappoint for [place] to recheck weeks weeks earlier if necessary

## 2013-11-13 DIAGNOSIS — H25813 Combined forms of age-related cataract, bilateral: Secondary | ICD-10-CM | POA: Diagnosis not present

## 2013-11-13 DIAGNOSIS — Z79899 Other long term (current) drug therapy: Secondary | ICD-10-CM | POA: Diagnosis not present

## 2013-11-13 DIAGNOSIS — M3210 Systemic lupus erythematosus, organ or system involvement unspecified: Secondary | ICD-10-CM | POA: Diagnosis not present

## 2013-11-14 DIAGNOSIS — L930 Discoid lupus erythematosus: Secondary | ICD-10-CM | POA: Diagnosis not present

## 2013-11-14 DIAGNOSIS — L810 Postinflammatory hyperpigmentation: Secondary | ICD-10-CM | POA: Diagnosis not present

## 2013-11-14 DIAGNOSIS — Z79899 Other long term (current) drug therapy: Secondary | ICD-10-CM | POA: Diagnosis not present

## 2013-11-14 DIAGNOSIS — D2371 Other benign neoplasm of skin of right lower limb, including hip: Secondary | ICD-10-CM | POA: Diagnosis not present

## 2013-11-14 DIAGNOSIS — Z23 Encounter for immunization: Secondary | ICD-10-CM | POA: Diagnosis not present

## 2013-11-29 ENCOUNTER — Other Ambulatory Visit: Payer: Self-pay | Admitting: Family Medicine

## 2013-12-12 ENCOUNTER — Encounter: Payer: Self-pay | Admitting: Family Medicine

## 2013-12-12 ENCOUNTER — Ambulatory Visit (INDEPENDENT_AMBULATORY_CARE_PROVIDER_SITE_OTHER): Payer: 59 | Admitting: Family Medicine

## 2013-12-12 VITALS — BP 132/100 | HR 71 | Temp 98.1°F | Resp 16 | Ht 62.0 in | Wt 188.8 lb

## 2013-12-12 DIAGNOSIS — J9801 Acute bronchospasm: Secondary | ICD-10-CM | POA: Diagnosis not present

## 2013-12-12 DIAGNOSIS — R05 Cough: Secondary | ICD-10-CM

## 2013-12-12 DIAGNOSIS — R062 Wheezing: Secondary | ICD-10-CM

## 2013-12-12 DIAGNOSIS — J988 Other specified respiratory disorders: Secondary | ICD-10-CM

## 2013-12-12 DIAGNOSIS — J22 Unspecified acute lower respiratory infection: Secondary | ICD-10-CM

## 2013-12-12 MED ORDER — AZITHROMYCIN 250 MG PO TABS: ORAL_TABLET | ORAL | Status: DC

## 2013-12-12 MED ORDER — ALBUTEROL SULFATE HFA 108 (90 BASE) MCG/ACT IN AERS: 2.0000 | INHALATION_SPRAY | Freq: Four times a day (QID) | RESPIRATORY_TRACT | Status: DC | PRN

## 2013-12-12 MED ORDER — HYDROCOD POLST-CHLORPHEN POLST 10-8 MG/5ML PO LQCR: 5.0000 mL | Freq: Two times a day (BID) | ORAL | Status: DC | PRN

## 2013-12-12 MED ORDER — PREDNISONE 20 MG PO TABS: ORAL_TABLET | ORAL | Status: DC

## 2013-12-12 NOTE — Progress Notes (Addendum)
Subjective:    Patient ID: Maureen Hodge, female    DOB: [birth year], 63 y.o.   MRN: [id number]  12/12/2013  Sore Throat   HPI This 63 y.o. female presents for evaluation of sore throat, cough.  Onset one week ago.  Started with scratchy throat.  Low grade fever; +chills.  +HA.  No ear pain.  Scratchy throat.  Wheezing.  Mild rhinorrhea.  Mild nasal congestion.  +horrible cough; +sputum clear.  +mild SOB.  Mild nausea; +vomiting this morning secondary cough.  No diarrhea.  Alkeseltzer Plus Cold.  Started using Albuterol inhaler four days ago; has used it all now.     Review of Systems  Constitutional: Positive for fever, chills and fatigue. Negative for diaphoresis.  HENT: Positive for congestion, postnasal drip, rhinorrhea, sore throat and voice change. Negative for drooling, ear pain, sinus pressure, sneezing and trouble swallowing.   Respiratory: Positive for cough, shortness of breath and wheezing.   Gastrointestinal: Positive for nausea and vomiting. Negative for diarrhea.  Neurological: Positive for headaches.    Past Medical History  Diagnosis Date  . Migraines   . Hypertension   . PLMD (periodic limb movement disorder)   . Diabetes mellitus     Pre-diabetes  . Brain tumor 05/2011    s/p resection  [facility].  . Lupus erythematosus   . Diffuse cystic mastopathy   . Other convulsions   . Intervertebral disc disorder with myelopathy, unspecified region   . Cerebral aneurysm, nonruptured   . Esophageal reflux   . Hypopotassemia 06/01/2007  . Unspecified sleep apnea     CPAP titrated to 7cm  . Allergy   . S/P bunionectomy 09/28/2013    [facility].  Right.   Past Surgical History  Procedure Laterality Date  . Total abdominal hysterectomy  1979    postpartum hemorrhage ovaries intact  . Dilation and curettage of uterus      x 5  . Hemorrhoid surgery    . Breast surgery      x 5 due to fibrocystic disease  . Foot surgery    . Brain surgery  2013  . Lumbar  surgery  2000  . Brain avm repair    . Tonsillectomy and adenoidectomy    . Spine surgery     Allergies  Allergen Reactions  . Lisinopril Swelling    Face and lips   Current Outpatient Prescriptions  Medication Sig Dispense Refill  . atenolol (TENORMIN) 25 MG tablet Two tablets every morning, one tablet q evening 270 tablet 3  . furosemide (LASIX) 40 MG tablet Take 1 tablet (40 mg total) by mouth daily. 90 tablet 1  . gabapentin (NEURONTIN) 800 MG tablet Take 1 tablet (800 mg total) by mouth 4 (four) times daily. 360 tablet 3  . HYDROcodone-acetaminophen (NORCO) 7.5-325 MG per tablet Take 1 tablet by mouth every 6 (six) hours as needed for moderate pain. 40 tablet 0  . hydroxychloroquine (PLAQUENIL) 200 MG tablet Take 200 mg by mouth 2 (two) times daily.      . mometasone (ELOCON) 0.1 % ointment Apply topically Twice daily as needed.    . nitroGLYCERIN (NITROSTAT) 0.4 MG SL tablet Place 1 tablet (0.4 mg total) under the tongue every 5 (five) minutes as needed for chest pain. 30 tablet 0  . nortriptyline (PAMELOR) 25 MG capsule Take 3 capsules (75 mg total) by mouth at bedtime. 270 capsule 3  . pantoprazole (PROTONIX) 40 MG tablet Take 1 tablet (40 mg total) by  mouth daily. 90 tablet 2  . potassium chloride SA (K-DUR,KLOR-CON) 20 MEQ tablet Take 2 tablets (40 mEq total) by mouth 2 (two) times daily. Takes 2 tablets twice daily. 360 tablet 3  . albuterol (PROVENTIL HFA;VENTOLIN HFA) 108 (90 BASE) MCG/ACT inhaler Inhale 2 puffs into the lungs every 6 (six) hours as needed for wheezing or shortness of breath (cough, shortness of breath or wheezing.). 1 Inhaler 0  . estradiol (ESTRACE) 1 MG tablet Take 1 tablet (1 mg total) by mouth daily. 30 tablet 11  . estrogen-methylTESTOSTERone (ESTRATEST) 1.25-2.5 MG per tablet Take 1 tablet by mouth daily. 30 tablet 0  . nortriptyline (PAMELOR) 25 MG capsule TAKE ONE CAPSULE BY MOUTH 3 TIMES A DAY 90 capsule 2  . triamterene-hydrochlorothiazide  (MAXZIDE-25) 37.5-25 MG per tablet TAKE 1 TABLET BY MOUTH DAILY. 30 tablet 1  . zoster vaccine live, PF, (ZOSTAVAX) 16109 UNT/0.65ML injection Inject 19,400 Units into the skin once. 0.65 mL 0   No current facility-administered medications for this visit.       Objective:    BP 132/100 mmHg  Pulse 71  Temp(Src) 98.1 F (36.7 C) (Oral)  Resp 16  Ht 5\' 2"  (1.575 m)  Wt 188 lb 12.8 oz (85.639 kg)  BMI 34.52 kg/m2  SpO2 96% Physical Exam  Constitutional: She is oriented to person, place, and time. She appears well-developed and well-nourished. No distress.  HENT:  Head: Normocephalic and atraumatic.  Right Ear: Tympanic membrane, external ear and ear canal normal.  Left Ear: Tympanic membrane, external ear and ear canal normal.  Nose: Mucosal edema and rhinorrhea present. Right sinus exhibits no maxillary sinus tenderness and no frontal sinus tenderness. Left sinus exhibits no maxillary sinus tenderness and no frontal sinus tenderness.  Mouth/Throat: Oropharynx is clear and moist.  Eyes: Conjunctivae are normal. Pupils are equal, round, and reactive to light.  Neck: Normal range of motion. Neck supple.  Cardiovascular: Normal rate, regular rhythm and normal heart sounds.  Exam reveals no gallop and no friction rub.   No murmur heard. Pulmonary/Chest: Effort normal and breath sounds normal. She has no wheezes. She has no rales.  Lymphadenopathy:    She has no cervical adenopathy.  Neurological: She is alert and oriented to person, place, and time.  Skin: She is not diaphoretic.  Psychiatric: She has a normal mood and affect. Her behavior is normal.  Nursing note and vitals reviewed.  Results for orders placed or performed in visit on 10/22/13  [facility] MAMMOGRAPHY  Result Value Ref Range   HM Mammogram      There is no mammographic evidence of malignancy.  Routine mammogram in 1 year.       Assessment & Plan:   1. Lower respiratory infection   2. Bronchospasm     -New. -Rx  for Zpack, Prednisone, Tussionex, and Albuterol provided. -Supportive care with rest, fluids, Mucinex DM bid PRN. -RTC for acute worsening.  Meds ordered this encounter  Medications  . DISCONTD: azithromycin (ZITHROMAX) 250 MG tablet    Sig: Two tablets daily x 1 day then one tablet daily x 4 days    Dispense:  6 tablet    Refill:  0  . albuterol (PROVENTIL HFA;VENTOLIN HFA) 108 (90 BASE) MCG/ACT inhaler    Sig: Inhale 2 puffs into the lungs every 6 (six) hours as needed for wheezing or shortness of breath (cough, shortness of breath or wheezing.).    Dispense:  1 Inhaler    Refill:  0  .  DISCONTD: predniSONE (DELTASONE) 20 MG tablet    Sig: Three tablets daily x 2 days, then two tablets daily x 4 days, then one tablet daily x 4 days    Dispense:  18 tablet    Refill:  0  . DISCONTD: chlorpheniramine-HYDROcodone (TUSSIONEX) 10-8 MG/5ML LQCR    Sig: Take 5 mLs by mouth every 12 (twelve) hours as needed for cough.    Dispense:  180 mL    Refill:  0    Return if symptoms worsen or fail to improve.   Reginia Forts, M.D.  [facility] [address] [place], [place]  [address] [phone] phone [phone] fax

## 2013-12-12 NOTE — Patient Instructions (Signed)

## 2013-12-18 ENCOUNTER — Other Ambulatory Visit: Payer: Self-pay | Admitting: Family Medicine

## 2013-12-21 ENCOUNTER — Encounter: Payer: 59 | Admitting: Podiatry

## 2014-01-11 ENCOUNTER — Ambulatory Visit (INDEPENDENT_AMBULATORY_CARE_PROVIDER_SITE_OTHER): Payer: 59

## 2014-01-11 ENCOUNTER — Ambulatory Visit (INDEPENDENT_AMBULATORY_CARE_PROVIDER_SITE_OTHER): Payer: Medicare Other | Admitting: Podiatry

## 2014-01-11 VITALS — BP 128/88 | HR 67 | Resp 16

## 2014-01-11 DIAGNOSIS — M21611 Bunion of right foot: Secondary | ICD-10-CM

## 2014-01-11 DIAGNOSIS — M2011 Hallux valgus (acquired), right foot: Secondary | ICD-10-CM

## 2014-01-13 NOTE — Progress Notes (Signed)
Subjective:     Patient ID: Maureen Hodge, female   DOB: 06-10-1950, 64 y.o.   MRN: [id number]  HPI patient states it's doing real good with minimal swelling and minimal pain and able to wear most shoes   Review of Systems     Objective:   Physical Exam Neurovascular status intact with muscle strength adequate and discomfort still noted plantar aspect left but improved by about 8590%    Assessment:     Improved from having osteotomy surgery first metatarsal right with mild swelling still present but good range of motion with 30 dorsiflexion 25 plantarflexion    Plan:     Advised patient on anti-inflammatories and gradual return to all normal shoes and reviewed x-rays. Patient's discharge and less needs to be seen again

## 2014-01-22 ENCOUNTER — Ambulatory Visit: Payer: Self-pay | Admitting: Surgery

## 2014-01-22 DIAGNOSIS — R928 Other abnormal and inconclusive findings on diagnostic imaging of breast: Secondary | ICD-10-CM | POA: Diagnosis not present

## 2014-01-23 ENCOUNTER — Encounter: Payer: Self-pay | Admitting: Family Medicine

## 2014-01-23 ENCOUNTER — Encounter (INDEPENDENT_AMBULATORY_CARE_PROVIDER_SITE_OTHER): Payer: Medicare Other | Admitting: Family Medicine

## 2014-01-23 VITALS — BP 154/110 | HR 81 | Temp 98.1°F | Resp 16 | Ht 62.25 in | Wt 181.4 lb

## 2014-01-23 DIAGNOSIS — I10 Essential (primary) hypertension: Secondary | ICD-10-CM | POA: Diagnosis not present

## 2014-01-23 DIAGNOSIS — R7302 Impaired glucose tolerance (oral): Secondary | ICD-10-CM

## 2014-01-23 LAB — COMPREHENSIVE METABOLIC PANEL
ALT: 11 U/L (ref 0–35)
AST: 19 U/L (ref 0–37)
Albumin: 4.1 g/dL (ref 3.5–5.2)
Alkaline Phosphatase: 68 U/L (ref 39–117)
BILIRUBIN TOTAL: 0.5 mg/dL (ref 0.2–1.2)
BUN: 14 mg/dL (ref 6–23)
CO2: 28 mEq/L (ref 19–32)
CREATININE: 0.72 mg/dL (ref 0.50–1.10)
Calcium: 9.4 mg/dL (ref 8.4–10.5)
Chloride: 100 mEq/L (ref 96–112)
Glucose, Bld: 101 mg/dL — ABNORMAL HIGH (ref 70–99)
Potassium: 3.8 mEq/L (ref 3.5–5.3)
SODIUM: 139 meq/L (ref 135–145)
TOTAL PROTEIN: 7.1 g/dL (ref 6.0–8.3)

## 2014-01-23 LAB — LIPID PANEL
Cholesterol: 137 mg/dL (ref 0–200)
HDL: 42 mg/dL (ref >39)
LDL Cholesterol: 69 mg/dL (ref 0–99)
Total CHOL/HDL Ratio: 3.3 Ratio
Triglycerides: 130 mg/dL (ref <150)
VLDL: 26 mg/dL (ref 0–40)

## 2014-01-23 LAB — POCT URINALYSIS DIPSTICK
BILIRUBIN UA: NEGATIVE
Glucose, UA: NEGATIVE
KETONES UA: NEGATIVE
Leukocytes, UA: NEGATIVE
Nitrite, UA: NEGATIVE
PH UA: 7
SPEC GRAV UA: 1.02
Urobilinogen, UA: 0.2

## 2014-01-23 LAB — POCT UA - MICROSCOPIC ONLY
CASTS, UR, LPF, POC: NEGATIVE
Crystals, Ur, HPF, POC: NEGATIVE
Mucus, UA: POSITIVE
YEAST UA: NEGATIVE

## 2014-01-24 ENCOUNTER — Telehealth: Payer: Self-pay

## 2014-01-24 LAB — CBC WITH DIFFERENTIAL/PLATELET

## 2014-01-24 LAB — HEMOGLOBIN A1C

## 2014-01-24 LAB — TSH: TSH: 3.081 u[IU]/mL (ref 0.350–4.500)

## 2014-01-24 NOTE — Progress Notes (Signed)
  This encounter was created in error - please disregard.  Patient left without being seen; had labs drawn.

## 2014-01-24 NOTE — Telephone Encounter (Signed)
Dr. Tamala Julian, Maureen Hodge was not sent to [facility] for CBC and A1C. Do you want pt to RTC at no charge for a re-draw?

## 2014-01-24 NOTE — Telephone Encounter (Signed)
No; will collect at return visit.

## 2014-01-28 DIAGNOSIS — N6011 Diffuse cystic mastopathy of right breast: Secondary | ICD-10-CM | POA: Diagnosis not present

## 2014-01-28 DIAGNOSIS — N6012 Diffuse cystic mastopathy of left breast: Secondary | ICD-10-CM | POA: Diagnosis not present

## 2014-01-29 ENCOUNTER — Other Ambulatory Visit: Payer: Self-pay | Admitting: Family Medicine

## 2014-02-01 ENCOUNTER — Other Ambulatory Visit: Payer: Self-pay | Admitting: Family Medicine

## 2014-02-01 MED ORDER — EST ESTROGENS-METHYLTEST 1.25-2.5 MG PO TABS
1.0000 | ORAL_TABLET | Freq: Every day | ORAL | Status: DC
Start: 2014-02-01 — End: 2014-11-17

## 2014-02-01 NOTE — Telephone Encounter (Signed)
Sarah wrote RF and I faxed to pharmacy.

## 2014-02-01 NOTE — Telephone Encounter (Signed)
Rx printed out again instead of going electronically, and then realized it is a controlled since it contains testosterone also. Sent to [facility] to review. Pt has appt sch for 03/04/14

## 2014-02-01 NOTE — Addendum Note (Signed)
Addended by: Mancel Bale on: 02/01/2014 04:04 PM   Modules accepted: Orders

## 2014-02-01 NOTE — Telephone Encounter (Signed)
Pt called and advised that pharm did not receive this script when written on 01/30/14. Resent  Rx.

## 2014-02-07 ENCOUNTER — Encounter: Payer: Self-pay | Admitting: *Deleted

## 2014-02-16 ENCOUNTER — Other Ambulatory Visit: Payer: Self-pay | Admitting: Family Medicine

## 2014-03-04 ENCOUNTER — Ambulatory Visit (INDEPENDENT_AMBULATORY_CARE_PROVIDER_SITE_OTHER): Payer: 59 | Admitting: Family Medicine

## 2014-03-04 ENCOUNTER — Telehealth: Payer: Self-pay | Admitting: Family Medicine

## 2014-03-04 ENCOUNTER — Encounter: Payer: Self-pay | Admitting: Family Medicine

## 2014-03-04 VITALS — BP 134/88 | HR 65 | Temp 98.1°F | Resp 16 | Ht 61.5 in | Wt 190.0 lb

## 2014-03-04 DIAGNOSIS — IMO0001 Reserved for inherently not codable concepts without codable children: Secondary | ICD-10-CM

## 2014-03-04 DIAGNOSIS — D320 Benign neoplasm of cerebral meninges: Secondary | ICD-10-CM | POA: Diagnosis not present

## 2014-03-04 DIAGNOSIS — Z0000 Encounter for general adult medical examination without abnormal findings: Secondary | ICD-10-CM | POA: Diagnosis not present

## 2014-03-04 DIAGNOSIS — I10 Essential (primary) hypertension: Secondary | ICD-10-CM

## 2014-03-04 DIAGNOSIS — E669 Obesity, unspecified: Secondary | ICD-10-CM | POA: Diagnosis not present

## 2014-03-04 DIAGNOSIS — R7302 Impaired glucose tolerance (oral): Secondary | ICD-10-CM | POA: Diagnosis not present

## 2014-03-04 DIAGNOSIS — R61 Generalized hyperhidrosis: Secondary | ICD-10-CM

## 2014-03-04 DIAGNOSIS — Q283 Other malformations of cerebral vessels: Secondary | ICD-10-CM | POA: Diagnosis not present

## 2014-03-04 DIAGNOSIS — G4733 Obstructive sleep apnea (adult) (pediatric): Secondary | ICD-10-CM | POA: Diagnosis not present

## 2014-03-04 DIAGNOSIS — K219 Gastro-esophageal reflux disease without esophagitis: Secondary | ICD-10-CM | POA: Diagnosis not present

## 2014-03-04 DIAGNOSIS — D500 Iron deficiency anemia secondary to blood loss (chronic): Secondary | ICD-10-CM | POA: Diagnosis not present

## 2014-03-04 DIAGNOSIS — D1802 Hemangioma of intracranial structures: Secondary | ICD-10-CM

## 2014-03-04 MED ORDER — ESTRADIOL 1 MG PO TABS: 1.0000 mg | ORAL_TABLET | Freq: Every day | ORAL | Status: DC

## 2014-03-04 MED ORDER — ZOSTER VACCINE LIVE 19400 UNT/0.65ML ~~LOC~~ SOLR: 0.6500 mL | Freq: Once | SUBCUTANEOUS | Status: DC

## 2014-03-04 NOTE — Progress Notes (Addendum)
Subjective:    Patient ID: Maureen Hodge, female    DOB: [birth year], 64 y.o.   MRN: [id number]  03/04/2014  Annual Exam   HPI This 64 y.o. female presents for Complete Physical Examination.  Last physical: 01/23/2013 Pap smear: 01/24/2012 Hysterectomy with B oophorectomy. Mammogram:  09/2013; repeat 01/2014. [place].  Follow up with Dr. Tamala Julian. Colonoscopy:  01/04/2005; repeat ten years; [facility]. Bone density: never TDAP:  10/06/2010. Pneumovax: in past. Zostavax: not yet. Influenza: 09/24/2013. Eye exam:  Due; +glasses; every six months.  Nice.  No g/c. Dental exam:  Every six months.   Hot flashes/sweating:  Tried weaning off of Estrogen/Methyltestosterone a few years ago.  Sweat excessively not just during the day.  Just head sweating.  Head sweating started three years ago.  Switched from Premarin to current rx three years ago.  Insurance did not want to pay for Premarin.     Review of Systems  Constitutional: Positive for diaphoresis. Negative for fever, chills, activity change, appetite change, fatigue and unexpected weight change.  HENT: Negative for congestion, dental problem, drooling, ear discharge, ear pain, facial swelling, hearing loss, mouth sores, nosebleeds, postnasal drip, rhinorrhea, sinus pressure, sneezing, sore throat, tinnitus, trouble swallowing and voice change.   Eyes: Negative for photophobia, pain, discharge, redness, itching and visual disturbance.  Respiratory: Positive for wheezing. Negative for apnea, cough, choking, chest tightness, shortness of breath and stridor.   Cardiovascular: Positive for leg swelling. Negative for chest pain and palpitations.  Gastrointestinal: Negative for nausea, vomiting, abdominal pain, diarrhea, constipation, blood in stool, abdominal distention, anal bleeding and rectal pain.  Endocrine: Negative for cold intolerance, heat intolerance, polydipsia, polyphagia and polyuria.  Genitourinary: Negative for dysuria, urgency,  frequency, hematuria, flank pain, decreased urine volume, vaginal bleeding, vaginal discharge, enuresis, difficulty urinating, genital sores, vaginal pain, menstrual problem, pelvic pain and dyspareunia.  Musculoskeletal: Negative for myalgias, back pain, joint swelling, arthralgias, gait problem, neck pain and neck stiffness.  Skin: Positive for color change. Negative for pallor, rash and wound.  Allergic/Immunologic: Negative for environmental allergies, food allergies and immunocompromised state.  Neurological: Positive for light-headedness and headaches. Negative for dizziness, tremors, seizures, syncope, facial asymmetry, speech difficulty, weakness and numbness.  Hematological: Negative for adenopathy. Does not bruise/bleed easily.  Psychiatric/Behavioral: Negative for suicidal ideas, hallucinations, behavioral problems, confusion, sleep disturbance, self-injury, dysphoric mood, decreased concentration and agitation. The patient is not nervous/anxious and is not hyperactive.     Past Medical History  Diagnosis Date  . Migraines   . Hypertension   . PLMD (periodic limb movement disorder)   . Diabetes mellitus     Pre-diabetes  . Brain tumor 05/2011    s/p resection  [facility].  . Lupus erythematosus   . Diffuse cystic mastopathy   . Other convulsions   . Intervertebral disc disorder with myelopathy, unspecified region   . Cerebral aneurysm, nonruptured   . Esophageal reflux   . Hypopotassemia 06/01/2007  . Unspecified sleep apnea     CPAP titrated to 7cm  . Allergy   . S/P bunionectomy 09/28/2013    [facility].  Right.   Past Surgical History  Procedure Laterality Date  . Total abdominal hysterectomy  1979    postpartum hemorrhage ovaries intact  . Dilation and curettage of uterus      x 5  . Hemorrhoid surgery    . Breast surgery      x 5 due to fibrocystic disease  . Foot surgery    . Brain surgery  2013  . Lumbar surgery  2000  . Brain avm repair    . Tonsillectomy  and adenoidectomy    . Spine surgery     Allergies  Allergen Reactions  . Lisinopril Swelling    Face and lips   Current Outpatient Prescriptions  Medication Sig Dispense Refill  . albuterol (PROVENTIL HFA;VENTOLIN HFA) 108 (90 BASE) MCG/ACT inhaler Inhale 2 puffs into the lungs every 6 (six) hours as needed for wheezing or shortness of breath (cough, shortness of breath or wheezing.). 1 Inhaler 0  . atenolol (TENORMIN) 25 MG tablet Two tablets every morning, one tablet q evening 270 tablet 3  . estradiol (ESTRACE) 1 MG tablet Take 1 tablet (1 mg total) by mouth daily. 30 tablet 11  . estrogen-methylTESTOSTERone (ESTRATEST) 1.25-2.5 MG per tablet Take 1 tablet by mouth daily. 30 tablet 0  . furosemide (LASIX) 40 MG tablet Take 1 tablet (40 mg total) by mouth daily. 90 tablet 1  . gabapentin (NEURONTIN) 800 MG tablet Take 1 tablet (800 mg total) by mouth 4 (four) times daily. 360 tablet 3  . HYDROcodone-acetaminophen (NORCO) 7.5-325 MG per tablet Take 1 tablet by mouth every 6 (six) hours as needed for moderate pain. 40 tablet 0  . hydroxychloroquine (PLAQUENIL) 200 MG tablet Take 200 mg by mouth 2 (two) times daily.      . mometasone (ELOCON) 0.1 % ointment Apply topically Twice daily as needed.    . nitroGLYCERIN (NITROSTAT) 0.4 MG SL tablet Place 1 tablet (0.4 mg total) under the tongue every 5 (five) minutes as needed for chest pain. 30 tablet 0  . nortriptyline (PAMELOR) 25 MG capsule Take 3 capsules (75 mg total) by mouth at bedtime. 270 capsule 3  . nortriptyline (PAMELOR) 25 MG capsule TAKE ONE CAPSULE BY MOUTH 3 TIMES A DAY 90 capsule 2  . pantoprazole (PROTONIX) 40 MG tablet Take 1 tablet (40 mg total) by mouth daily. 90 tablet 2  . potassium chloride SA (K-DUR,KLOR-CON) 20 MEQ tablet Take 2 tablets (40 mEq total) by mouth 2 (two) times daily. Takes 2 tablets twice daily. 360 tablet 3  . triamterene-hydrochlorothiazide (MAXZIDE-25) 37.5-25 MG per tablet TAKE 1 TABLET BY MOUTH DAILY.  30 tablet 1  . zoster vaccine live, PF, (ZOSTAVAX) 62952 UNT/0.65ML injection Inject 19,400 Units into the skin once. 0.65 mL 0   No current facility-administered medications for this visit.       Objective:    BP 134/88 mmHg  Pulse 65  Temp(Src) 98.1 F (36.7 C) (Oral)  Resp 16  Ht 5' 1.5" (1.562 m)  Wt 190 lb (86.183 kg)  BMI 35.32 kg/m2  SpO2 99% Physical Exam  Constitutional: She is oriented to person, place, and time. She appears well-developed and well-nourished. No distress.  obese  HENT:  Head: Normocephalic and atraumatic.  Right Ear: External ear normal.  Left Ear: External ear normal.  Nose: Nose normal.  Mouth/Throat: Oropharynx is clear and moist.  Eyes: Conjunctivae and EOM are normal. Pupils are equal, round, and reactive to light.  Neck: Normal range of motion and full passive range of motion without pain. Neck supple. No JVD present. Carotid bruit is not present. No thyromegaly present.  Cardiovascular: Normal rate, regular rhythm, normal heart sounds and intact distal pulses.  Exam reveals no gallop and no friction rub.   No murmur heard. Trace non-pitting edema BLE.  Pulmonary/Chest: Effort normal and breath sounds normal. She has no wheezes. She has no rales. Right breast exhibits no inverted  nipple, no mass, no nipple discharge, no skin change and no tenderness. Left breast exhibits no inverted nipple, no mass, no nipple discharge, no skin change and no tenderness. Breasts are symmetrical.  Abdominal: Soft. Bowel sounds are normal. She exhibits no distension and no mass. There is no tenderness. There is no rebound and no guarding.  Musculoskeletal:       Right shoulder: Normal.       Left shoulder: Normal.       Cervical back: Normal.  Lymphadenopathy:    She has no cervical adenopathy.  Neurological: She is alert and oriented to person, place, and time. She has normal reflexes. No cranial nerve deficit. She exhibits normal muscle tone. Coordination  normal.  Skin: Skin is warm and dry. No rash noted. She is not diaphoretic. No erythema. No pallor.  Psychiatric: She has a normal mood and affect. Her behavior is normal. Judgment and thought content normal.  Nursing note and vitals reviewed.       Assessment & Plan:   1. Routine physical examination   2. HYPERTENSION, BENIGN   3. Gastroesophageal reflux disease without esophagitis   4. Glucose intolerance (impaired glucose tolerance)   5. Obstructive sleep apnea   6. Obesity (BMI 30.0-34.9)   7. Sweating   8. Benign neoplasm of cerebral meninges   9. Benign cerebral hemangioma   10. Anomalies of cerebrovascular system, congenital   11. Iron deficiency anemia due to chronic blood loss     1.  Complete Physical Examination: anticipatory guidance provided --- weight loss, exercise.  No longer warrants pap smears due to hysterectomy status.  Mammogram UTD.  Colonoscopy UTD.  Immunizations reviewed; rx provided again for Zostavax.   2.  HTN: controlled; obtain labs, u/a, EKG.  Refills provided. 3.  Glucose intolerance: stable; obtain labs; recommend weight loss, exercise, low-sugar food choices. 4.  OSA: stable on CPAP.  Recommend weight loss. 5. Obesity: worsening; highly recommend weight loss, exercise.  RTC four months for weight check.  Decrease sweets. 6.  Menopausal vasomotor instability: switch from current HRT with testosterone to Estrace oral tablets.  Highly recommend weight loss because obesity also contributing to excessive sweating.  Testosterone supplementation may also be contributing. 7.  History of meningioma and brain aneurysm: stable; followed by NS yearly.  Appointment in April 2016. 8. Headaches: improved since meningioma resection. 9.  Discoid lupus: stable; followed by dermatology [facility] every six months.   Meds ordered this encounter  Medications  . estradiol (ESTRACE) 1 MG tablet    Sig: Take 1 tablet (1 mg total) by mouth daily.    Dispense:  30 tablet     Refill:  11  . zoster vaccine live, PF, (ZOSTAVAX) 93112 UNT/0.65ML injection    Sig: Inject 19,400 Units into the skin once.    Dispense:  0.65 mL    Refill:  0    Return in about 4 months (around 07/03/2014) for recheck.    Jacklin Zwick Elayne Guerin, M.D. [facility] [address] [place], [place]  [address] [phone] phone [phone] fax

## 2014-03-04 NOTE — Telephone Encounter (Signed)
Patient had appointment this morning (03/04/2014). States that she forgot to ask Dr. Tamala Julian something during her appointment. Would not tell me what this was just requested that Dr. Tamala Julian call her back at the end of her shift today if possible.  [phone]

## 2014-03-04 NOTE — Patient Instructions (Signed)

## 2014-03-05 NOTE — Telephone Encounter (Signed)
Patient was leaving message on behalf of husband.

## 2014-03-05 NOTE — Addendum Note (Signed)
Addended by: Wardell Honour on: 03/05/2014 01:05 PM   Modules accepted: Orders, Medications

## 2014-03-12 ENCOUNTER — Telehealth: Payer: Self-pay | Admitting: Family Medicine

## 2014-03-13 ENCOUNTER — Other Ambulatory Visit: Payer: Self-pay | Admitting: Family Medicine

## 2014-03-13 NOTE — Telephone Encounter (Signed)
Patient says she needs these filled ASAP because she is completely out

## 2014-03-13 NOTE — Telephone Encounter (Signed)
Pt.notified

## 2014-03-13 NOTE — Telephone Encounter (Signed)
Rx sent 

## 2014-03-27 ENCOUNTER — Other Ambulatory Visit: Payer: Self-pay | Admitting: Family Medicine

## 2014-03-28 NOTE — Telephone Encounter (Signed)
Dr Tamala Julian, pt just had check up, but I don't see this med discussed. OK to RF?

## 2014-04-04 ENCOUNTER — Telehealth: Payer: Self-pay

## 2014-04-04 NOTE — Telephone Encounter (Signed)
Don't see where we have called pt.

## 2014-04-04 NOTE — Telephone Encounter (Signed)
Patient received a automated voice message and that didn't give any specific details. Patient was told that a message will be sent to clinical and they will call her back  if there anything she needs to know. Please call if necessary! [phone]

## 2014-04-13 ENCOUNTER — Other Ambulatory Visit: Payer: Self-pay | Admitting: Family Medicine

## 2014-04-14 ENCOUNTER — Other Ambulatory Visit: Payer: Self-pay | Admitting: Family Medicine

## 2014-04-20 ENCOUNTER — Other Ambulatory Visit: Payer: Self-pay | Admitting: Family Medicine

## 2014-05-13 DIAGNOSIS — Z79899 Other long term (current) drug therapy: Secondary | ICD-10-CM | POA: Diagnosis not present

## 2014-05-13 DIAGNOSIS — H5203 Hypermetropia, bilateral: Secondary | ICD-10-CM | POA: Diagnosis not present

## 2014-05-13 DIAGNOSIS — H25813 Combined forms of age-related cataract, bilateral: Secondary | ICD-10-CM | POA: Diagnosis not present

## 2014-05-13 DIAGNOSIS — H52223 Regular astigmatism, bilateral: Secondary | ICD-10-CM | POA: Diagnosis not present

## 2014-05-13 DIAGNOSIS — M3210 Systemic lupus erythematosus, organ or system involvement unspecified: Secondary | ICD-10-CM | POA: Diagnosis not present

## 2014-05-15 DIAGNOSIS — L930 Discoid lupus erythematosus: Secondary | ICD-10-CM | POA: Diagnosis not present

## 2014-05-15 DIAGNOSIS — D2371 Other benign neoplasm of skin of right lower limb, including hip: Secondary | ICD-10-CM | POA: Diagnosis not present

## 2014-05-15 DIAGNOSIS — Z79899 Other long term (current) drug therapy: Secondary | ICD-10-CM | POA: Diagnosis not present

## 2014-05-16 ENCOUNTER — Other Ambulatory Visit: Payer: Self-pay | Admitting: Family Medicine

## 2014-05-28 ENCOUNTER — Other Ambulatory Visit: Payer: Self-pay

## 2014-05-28 MED ORDER — ESTRADIOL 1 MG PO TABS
1.0000 mg | ORAL_TABLET | Freq: Every day | ORAL | Status: DC
Start: 2014-05-28 — End: 2014-07-10

## 2014-05-28 NOTE — Telephone Encounter (Signed)
Pharm requesting 90 day supply. Done

## 2014-06-09 ENCOUNTER — Other Ambulatory Visit: Payer: Self-pay | Admitting: Family Medicine

## 2014-06-11 ENCOUNTER — Other Ambulatory Visit: Payer: Self-pay | Admitting: Family Medicine

## 2014-06-12 DIAGNOSIS — L930 Discoid lupus erythematosus: Secondary | ICD-10-CM | POA: Diagnosis not present

## 2014-06-15 ENCOUNTER — Other Ambulatory Visit: Payer: Self-pay | Admitting: Family Medicine

## 2014-07-03 DIAGNOSIS — L309 Dermatitis, unspecified: Secondary | ICD-10-CM | POA: Diagnosis not present

## 2014-07-08 ENCOUNTER — Other Ambulatory Visit: Payer: Self-pay | Admitting: Physician Assistant

## 2014-07-10 ENCOUNTER — Ambulatory Visit (INDEPENDENT_AMBULATORY_CARE_PROVIDER_SITE_OTHER): Payer: 59 | Admitting: Family Medicine

## 2014-07-10 ENCOUNTER — Encounter: Payer: Self-pay | Admitting: Family Medicine

## 2014-07-10 VITALS — BP 112/80 | HR 66 | Temp 97.9°F | Resp 16 | Ht 62.0 in | Wt 183.4 lb

## 2014-07-10 DIAGNOSIS — G4733 Obstructive sleep apnea (adult) (pediatric): Secondary | ICD-10-CM | POA: Diagnosis not present

## 2014-07-10 DIAGNOSIS — K219 Gastro-esophageal reflux disease without esophagitis: Secondary | ICD-10-CM | POA: Diagnosis not present

## 2014-07-10 DIAGNOSIS — L930 Discoid lupus erythematosus: Secondary | ICD-10-CM

## 2014-07-10 DIAGNOSIS — D6489 Other specified anemias: Secondary | ICD-10-CM

## 2014-07-10 DIAGNOSIS — R61 Generalized hyperhidrosis: Secondary | ICD-10-CM | POA: Diagnosis not present

## 2014-07-10 DIAGNOSIS — R609 Edema, unspecified: Secondary | ICD-10-CM | POA: Diagnosis not present

## 2014-07-10 DIAGNOSIS — I10 Essential (primary) hypertension: Secondary | ICD-10-CM | POA: Diagnosis not present

## 2014-07-10 DIAGNOSIS — D329 Benign neoplasm of meninges, unspecified: Secondary | ICD-10-CM | POA: Diagnosis not present

## 2014-07-10 DIAGNOSIS — R7302 Impaired glucose tolerance (oral): Secondary | ICD-10-CM | POA: Diagnosis not present

## 2014-07-10 DIAGNOSIS — Z9989 Dependence on other enabling machines and devices: Secondary | ICD-10-CM

## 2014-07-10 LAB — COMPREHENSIVE METABOLIC PANEL
ALBUMIN: 3.8 g/dL (ref 3.5–5.2)
ALT: 14 U/L (ref 0–35)
AST: 20 U/L (ref 0–37)
Alkaline Phosphatase: 52 U/L (ref 39–117)
BUN: 13 mg/dL (ref 6–23)
CALCIUM: 9.4 mg/dL (ref 8.4–10.5)
CHLORIDE: 101 meq/L (ref 96–112)
CO2: 26 meq/L (ref 19–32)
CREATININE: 0.68 mg/dL (ref 0.50–1.10)
GLUCOSE: 93 mg/dL (ref 70–99)
POTASSIUM: 4 meq/L (ref 3.5–5.3)
Sodium: 139 mEq/L (ref 135–145)
Total Bilirubin: 0.5 mg/dL (ref 0.2–1.2)
Total Protein: 6.5 g/dL (ref 6.0–8.3)

## 2014-07-10 LAB — CBC WITH DIFFERENTIAL/PLATELET
BASOS PCT: 0 % (ref 0–1)
Basophils Absolute: 0 10*3/uL (ref 0.0–0.1)
Eosinophils Absolute: 0.1 10*3/uL (ref 0.0–0.7)
Eosinophils Relative: 1 % (ref 0–5)
HCT: 37.9 % (ref 36.0–46.0)
Hemoglobin: 13.2 g/dL (ref 12.0–15.0)
LYMPHS ABS: 3.1 10*3/uL (ref 0.7–4.0)
Lymphocytes Relative: 40 % (ref 12–46)
MCH: 31.2 pg (ref 26.0–34.0)
MCHC: 34.8 g/dL (ref 30.0–36.0)
MCV: 89.6 fL (ref 78.0–100.0)
MONO ABS: 0.6 10*3/uL (ref 0.1–1.0)
MPV: 9.5 fL (ref 8.6–12.4)
Monocytes Relative: 8 % (ref 3–12)
Neutro Abs: 3.9 10*3/uL (ref 1.7–7.7)
Neutrophils Relative %: 51 % (ref 43–77)
PLATELETS: 284 10*3/uL (ref 150–400)
RBC: 4.23 MIL/uL (ref 3.87–5.11)
RDW: 14.6 % (ref 11.5–15.5)
WBC: 7.7 10*3/uL (ref 4.0–10.5)

## 2014-07-10 LAB — HEMOGLOBIN A1C
Hgb A1c MFr Bld: 6 % — ABNORMAL HIGH (ref <5.7)
MEAN PLASMA GLUCOSE: 126 mg/dL — AB (ref <117)

## 2014-07-10 MED ORDER — ATENOLOL 25 MG PO TABS: ORAL_TABLET | ORAL | Status: DC

## 2014-07-10 MED ORDER — POTASSIUM CHLORIDE CRYS ER 20 MEQ PO TBCR: 40.0000 meq | EXTENDED_RELEASE_TABLET | Freq: Two times a day (BID) | ORAL | Status: DC

## 2014-07-10 MED ORDER — GABAPENTIN 800 MG PO TABS: ORAL_TABLET | ORAL | Status: DC

## 2014-07-10 MED ORDER — PANTOPRAZOLE SODIUM 40 MG PO TBEC: 40.0000 mg | DELAYED_RELEASE_TABLET | Freq: Every day | ORAL | Status: DC

## 2014-07-10 MED ORDER — ESTRADIOL 2 MG PO TABS: 2.0000 mg | ORAL_TABLET | Freq: Every day | ORAL | Status: DC

## 2014-07-10 MED ORDER — NORTRIPTYLINE HCL 25 MG PO CAPS: 25.0000 mg | ORAL_CAPSULE | Freq: Three times a day (TID) | ORAL | Status: DC

## 2014-07-10 MED ORDER — TRIAMTERENE-HCTZ 37.5-25 MG PO TABS: ORAL_TABLET | ORAL | Status: DC

## 2014-07-10 NOTE — Progress Notes (Signed)
Subjective:    Patient ID: Maureen Hodge, female    DOB: 1950/04/30, 64 y.o.   MRN: [id number]  07/10/2014  Hypertension   HPI This 64 y.o. female presents for four month follow-up:  1. HTN:  Patient reports good compliance with medication, good tolerance to medication, and good symptom control.  Not checking BP much at home; home BP running 125-130/84-85.   Rare diastolic reading in 40J.  Taking Lasix PRN; none in last three weeks.  Mild swelling at night.   2.  OSA on CPAP:  Patient reports good compliance with medication, good tolerance to medication, and good symptom control.   Husband got a new machine; pt may need a new machine.  Feels tired if poor compliance.  3. Glucose Intolerance:  Weight down 7 pounds.  Trying to watch sugar intake.  4.  Excessive sweating: switched HRT with testosterone to Estrace at last visit.  Weight down 7 pounds. Requesting higher dose of HRT.    Has started exercising some to lose weight.  Sweats worse during the day; has some night sweats but less severe than sweating from head during the day. No unintentional weight loss. No cough.    5.  Meningioma:  Due for NS follow-up; forgot about appointment; last follow-up one year ago.  Feeling well.  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  Had recent eye exam due to Plaquenil therapy; has moderate catararacts.  7. GERD:  Patient reports good compliance with medication, good tolerance to medication, and good symptom control.  Taking Protonix 27m daily.  Needs refill today.      Review of Systems  Constitutional: Positive for diaphoresis. Negative for fever, chills and fatigue.  HENT: Negative for congestion, postnasal drip, rhinorrhea and sore throat.   Eyes: Negative for visual disturbance.  Respiratory: Negative for cough and shortness of breath.   Cardiovascular: Negative for chest pain, palpitations and leg swelling.    Gastrointestinal: Negative for nausea, vomiting, abdominal pain, diarrhea and constipation.  Endocrine: Negative for cold intolerance, heat intolerance, polydipsia, polyphagia and polyuria.  Skin: Positive for color change and rash. Negative for wound.  Neurological: Negative for dizziness, tremors, seizures, syncope, facial asymmetry, speech difficulty, weakness, light-headedness, numbness and headaches.    Past Medical History  Diagnosis Date  . Migraines   . Hypertension   . PLMD (periodic limb movement disorder)   . Diabetes mellitus     Pre-diabetes  . Brain tumor 05/2011    s/p resection  [facility].  . Lupus erythematosus   . Diffuse cystic mastopathy   . Other convulsions   . Intervertebral disc disorder with myelopathy, unspecified region   . Cerebral aneurysm, nonruptured   . Esophageal reflux   . Hypopotassemia 06/01/2007  . Unspecified sleep apnea     CPAP titrated to 7cm  . Allergy   . S/P bunionectomy 09/28/2013    [facility].  Right.   Past Surgical History  Procedure Laterality Date  . Total abdominal hysterectomy  1979    postpartum hemorrhage ovaries intact  . Dilation and curettage of uterus      x 5  . Hemorrhoid surgery    . Breast surgery      x 5 due to fibrocystic disease  . Foot surgery    . Brain surgery  2013  . Lumbar surgery  2000  . Brain avm repair    . Tonsillectomy and adenoidectomy    .  Spine surgery     Allergies  Allergen Reactions  . Lisinopril Swelling    Face and lips   Current Outpatient Prescriptions  Medication Sig Dispense Refill  . albuterol (PROVENTIL HFA;VENTOLIN HFA) 108 (90 BASE) MCG/ACT inhaler Inhale 2 puffs into the lungs every 6 (six) hours as needed for wheezing or shortness of breath (cough, shortness of breath or wheezing.). 1 Inhaler 0  . atenolol (TENORMIN) 25 MG tablet TAKE 2 TABLETS BY MOUTH EVERY MORNING AND TAKE 1 TABLET EVERY EVENING 270 tablet 3  . estradiol (ESTRACE) 2 MG tablet Take 1 tablet (2 mg  total) by mouth daily. 90 tablet 1  . estrogen-methylTESTOSTERone (ESTRATEST) 1.25-2.5 MG per tablet Take 1 tablet by mouth daily. 30 tablet 0  . furosemide (LASIX) 40 MG tablet Take 1 tablet (40 mg total) by mouth daily. 90 tablet 1  . gabapentin (NEURONTIN) 800 MG tablet TAKE 1 TABLET (800 MG TOTAL) BY MOUTH 4 (FOUR) TIMES DAILY. 360 tablet 3  . HYDROcodone-acetaminophen (NORCO) 7.5-325 MG per tablet Take 1 tablet by mouth every 6 (six) hours as needed for moderate pain. 40 tablet 0  . hydroxychloroquine (PLAQUENIL) 200 MG tablet Take 200 mg by mouth 2 (two) times daily.      . mometasone (ELOCON) 0.1 % ointment Apply topically Twice daily as needed.    . nitroGLYCERIN (NITROSTAT) 0.4 MG SL tablet Place 1 tablet (0.4 mg total) under the tongue every 5 (five) minutes as needed for chest pain. 30 tablet 0  . nortriptyline (PAMELOR) 25 MG capsule Take 1 capsule (25 mg total) by mouth 3 (three) times daily. 90 capsule 3  . pantoprazole (PROTONIX) 40 MG tablet Take 1 tablet (40 mg total) by mouth daily. 90 tablet 3  . potassium chloride SA (KLOR-CON M20) 20 MEQ tablet Take 2 tablets (40 mEq total) by mouth 2 (two) times daily. 360 tablet 3  . triamterene-hydrochlorothiazide (MAXZIDE-25) 37.5-25 MG per tablet TAKE 1 TABLET BY MOUTH DAILY. 90 tablet 3  . zoster vaccine live, PF, (ZOSTAVAX) 37628 UNT/0.65ML injection Inject 19,400 Units into the skin once. 0.65 mL 0   No current facility-administered medications for this visit.   History   Social History  . Marital Status: Married    Spouse Name: N/A  . Number of Children: 2  . Years of Education: N/A   Occupational History  . Retired    Social History Main Topics  . Smoking status: Never Smoker   . Smokeless tobacco: Never Used  . Alcohol Use: No  . Drug Use: No  . Sexual Activity: Yes   Other Topics Concern  . Not on file   Social History Narrative   Marital status:  Married  X 43 years, happily , no abuse.       Children:  2  children; 4 grandchildren.      Lives: with husband.      Employment: disability.  Works part-time at [employer]; working PRN.      Tobacco: never      Alcohol: wine once per month      Drugs: none     Exercise: sporadic in 2016; walking some.     Caffeine Use: Some.    Seatbelt:  Always uses seat belts.       Smoke alarm in the home.       Objective:    BP 112/80 mmHg  Pulse 66  Temp(Src) 97.9 F (36.6 C) (Oral)  Resp 16  Ht '5\' 2"'  (1.575 m)  Wt 183 lb 6.4 oz (83.19 kg)  BMI 33.54 kg/m2  SpO2 98% Physical Exam  Constitutional: She is oriented to person, place, and time. She appears well-developed and well-nourished. No distress.  HENT:  Head: Normocephalic and atraumatic.  Right Ear: External ear normal.  Left Ear: External ear normal.  Nose: Nose normal.  Mouth/Throat: Oropharynx is clear and moist.  Eyes: Conjunctivae and EOM are normal. Pupils are equal, round, and reactive to light.  Neck: Normal range of motion. Neck supple. Carotid bruit is not present. No thyromegaly present.  Cardiovascular: Normal rate, regular rhythm, normal heart sounds and intact distal pulses.  Exam reveals no gallop and no friction rub.   No murmur heard. Pulmonary/Chest: Effort normal and breath sounds normal. She has no wheezes. She has no rales.  Abdominal: Soft. Bowel sounds are normal. She exhibits no distension and no mass. There is no tenderness. There is no rebound and no guarding.  Lymphadenopathy:    She has no cervical adenopathy.  Neurological: She is alert and oriented to person, place, and time. No cranial nerve deficit. She exhibits normal muscle tone. Coordination normal.  Skin: Skin is warm and dry. Rash noted. She is not diaphoretic. There is erythema. No pallor.  R external ear anatomy with erythema and scaling rash.  Psychiatric: She has a normal mood and affect. Her behavior is normal.   Results for orders placed or performed in visit on 01/23/14  CBC with  Differential  Result Value Ref Range   WBC CANCELED 4.0 - 10.5 K/uL   RBC CANCELED 3.87 - 5.11 MIL/uL   Hemoglobin CANCELED 12.0 - 15.0 g/dL   HCT CANCELED 36.0 - 46.0 %   MCV CANCELED 78.0 - 100.0 fL   MCH CANCELED 26.0 - 34.0 pg   MCHC CANCELED 30.0 - 36.0 g/dL   RDW CANCELED 11.5 - 15.5 %   Platelets CANCELED 150 - 400 K/uL   MPV CANCELED 8.6 - 12.4 fL   Neutrophils Relative % CANCELED 43 - 77 %   Neutro Abs CANCELED 1.7 - 7.7 K/uL   Lymphocytes Relative CANCELED 12 - 46 %   Lymphs Abs CANCELED 0.7 - 4.0 K/uL   Monocytes Relative CANCELED 3 - 12 %   Monocytes Absolute CANCELED 0.1 - 1.0 K/uL   Eosinophils Relative CANCELED 0 - 5 %   Eosinophils Absolute CANCELED 0.0 - 0.7 K/uL   Basophils Relative CANCELED 0 - 1 %   Basophils Absolute CANCELED 0.0 - 0.1 K/uL   Smear Review Criteria for review not met   Comprehensive metabolic panel  Result Value Ref Range   Sodium 139 135 - 145 mEq/L   Potassium 3.8 3.5 - 5.3 mEq/L   Chloride 100 96 - 112 mEq/L   CO2 28 19 - 32 mEq/L   Glucose, Bld 101 (H) 70 - 99 mg/dL   BUN 14 6 - 23 mg/dL   Creat 0.72 0.50 - 1.10 mg/dL   Total Bilirubin 0.5 0.2 - 1.2 mg/dL   Alkaline Phosphatase 68 39 - 117 U/L   AST 19 0 - 37 U/L   ALT 11 0 - 35 U/L   Total Protein 7.1 6.0 - 8.3 g/dL   Albumin 4.1 3.5 - 5.2 g/dL   Calcium 9.4 8.4 - 10.5 mg/dL  Hemoglobin A1c  Result Value Ref Range   Hgb A1c MFr Bld CANCELED <5.7 %   Mean Plasma Glucose CANCELED <117 mg/dL  Lipid panel  Result Value Ref Range   Cholesterol  137 0 - 200 mg/dL   Triglycerides 130 <150 mg/dL   HDL 42 >39 mg/dL   Total CHOL/HDL Ratio 3.3 Ratio   VLDL 26 0 - 40 mg/dL   LDL Cholesterol 69 0 - 99 mg/dL  TSH  Result Value Ref Range   TSH 3.081 0.350 - 4.500 uIU/mL  POCT urinalysis dipstick  Result Value Ref Range   Color, UA yellow    Clarity, UA clear    Glucose, UA neg    Bilirubin, UA neg    Ketones, UA neg    Spec Grav, UA 1.020    Blood, UA trace    pH, UA 7.0     Protein, UA trace    Urobilinogen, UA 0.2    Nitrite, UA neg    Leukocytes, UA Negative   POCT UA - Microscopic Only  Result Value Ref Range   WBC, Ur, HPF, POC 2-4    RBC, urine, microscopic 3-7    Bacteria, U Microscopic 3+    Mucus, UA pos    Epithelial cells, urine per micros 3-5    Crystals, Ur, HPF, POC neg    Casts, Ur, LPF, POC neg    Yeast, UA neg        Assessment & Plan:   1. HYPERTENSION, BENIGN   2. Gastroesophageal reflux disease without esophagitis   3. Edema   4. Anemia due to other cause   5. Discoid lupus   6. Meningioma   7. Glucose intolerance (impaired glucose tolerance)   8. Excessive sweating     1. Hypertension: controlled; obtain labs; refills provided. 2.  GERD: controlled; refill of Protonix provided. 3.  Edema: chronic and controlled; using Lasix sparingly at this time; recommend further exercise and weight loss. 4.  Anemia: stable; repeat labs. 5.  Discoid lupus: stable; maintained on Plaquenil per dermatology; s/p recent eye exam. 6.  Meningioma: stable; s/p resection; due for follow-up with NS. Asymptomatic. 7.  Glucose intolerance: stable; weight down 7 pounds from last visit; recommend further weight loss, exercise, dietary modification. 8.  Excessive sweating: persistent; no improvement with discontinuing testosterone supplementation; agreeable to trial of increasing Estrace to 74m daily; however, if no improvement in sweating with increased dose, highly recommend decreasing dose back down. Obtain Tb Gold to rule out tuberculosis as cause of sweating. 9. OSA on CPAP: controlled; reports good compliance with CPAP and good symptom control.   Meds ordered this encounter  Medications  . pantoprazole (PROTONIX) 40 MG tablet    Sig: Take 1 tablet (40 mg total) by mouth daily.    Dispense:  90 tablet    Refill:  3  . atenolol (TENORMIN) 25 MG tablet    Sig: TAKE 2 TABLETS BY MOUTH EVERY MORNING AND TAKE 1 TABLET EVERY EVENING    Dispense:   270 tablet    Refill:  3  . gabapentin (NEURONTIN) 800 MG tablet    Sig: TAKE 1 TABLET (800 MG TOTAL) BY MOUTH 4 (FOUR) TIMES DAILY.    Dispense:  360 tablet    Refill:  3  . potassium chloride SA (KLOR-CON M20) 20 MEQ tablet    Sig: Take 2 tablets (40 mEq total) by mouth 2 (two) times daily.    Dispense:  360 tablet    Refill:  3  . nortriptyline (PAMELOR) 25 MG capsule    Sig: Take 1 capsule (25 mg total) by mouth 3 (three) times daily.    Dispense:  90 capsule  Refill:  3  . triamterene-hydrochlorothiazide (MAXZIDE-25) 37.5-25 MG per tablet    Sig: TAKE 1 TABLET BY MOUTH DAILY.    Dispense:  90 tablet    Refill:  3  . estradiol (ESTRACE) 2 MG tablet    Sig: Take 1 tablet (2 mg total) by mouth daily.    Dispense:  90 tablet    Refill:  1    Return in about 4 months (around 11/10/2014) for recheck.    Kristi Elayne Guerin, M.D. [facility] [address] [place], [place]  [address] [phone] phone [phone] fax

## 2014-07-10 NOTE — Patient Instructions (Signed)

## 2014-07-12 LAB — QUANTIFERON TB GOLD ASSAY (BLOOD)
Interferon Gamma Release Assay: NEGATIVE
Mitogen value: >10 IU/mL
Quantiferon Nil Value: 0.04 IU/mL
Quantiferon Tb Ag Minus Nil Value: 0 IU/mL
TB Ag value: 0.04 IU/mL

## 2014-07-29 ENCOUNTER — Telehealth: Payer: Self-pay | Admitting: Family Medicine

## 2014-07-29 ENCOUNTER — Other Ambulatory Visit: Payer: Self-pay | Admitting: Physician Assistant

## 2014-07-29 NOTE — Telephone Encounter (Signed)
Gave patient results

## 2014-08-18 ENCOUNTER — Other Ambulatory Visit: Payer: Self-pay | Admitting: Family Medicine

## 2014-08-27 DIAGNOSIS — L309 Dermatitis, unspecified: Secondary | ICD-10-CM | POA: Diagnosis not present

## 2014-08-27 DIAGNOSIS — M7062 Trochanteric bursitis, left hip: Secondary | ICD-10-CM | POA: Diagnosis not present

## 2014-08-27 DIAGNOSIS — L930 Discoid lupus erythematosus: Secondary | ICD-10-CM | POA: Diagnosis not present

## 2014-10-09 DIAGNOSIS — L930 Discoid lupus erythematosus: Secondary | ICD-10-CM | POA: Diagnosis not present

## 2014-10-09 DIAGNOSIS — H61031 Chondritis of right external ear: Secondary | ICD-10-CM | POA: Diagnosis not present

## 2014-10-14 DIAGNOSIS — M5416 Radiculopathy, lumbar region: Secondary | ICD-10-CM | POA: Diagnosis not present

## 2014-10-14 DIAGNOSIS — M79605 Pain in left leg: Secondary | ICD-10-CM | POA: Diagnosis not present

## 2014-10-25 ENCOUNTER — Other Ambulatory Visit: Payer: Self-pay | Admitting: Orthopedic Surgery

## 2014-10-25 DIAGNOSIS — M79605 Pain in left leg: Secondary | ICD-10-CM

## 2014-10-25 DIAGNOSIS — M4726 Other spondylosis with radiculopathy, lumbar region: Secondary | ICD-10-CM

## 2014-10-25 DIAGNOSIS — M5136 Other intervertebral disc degeneration, lumbar region: Secondary | ICD-10-CM | POA: Diagnosis not present

## 2014-10-25 DIAGNOSIS — M4316 Spondylolisthesis, lumbar region: Secondary | ICD-10-CM | POA: Diagnosis not present

## 2014-10-25 DIAGNOSIS — M545 Low back pain: Secondary | ICD-10-CM | POA: Diagnosis not present

## 2014-11-01 DIAGNOSIS — L309 Dermatitis, unspecified: Secondary | ICD-10-CM | POA: Diagnosis not present

## 2014-11-05 DIAGNOSIS — Z23 Encounter for immunization: Secondary | ICD-10-CM | POA: Diagnosis not present

## 2014-11-05 DIAGNOSIS — L309 Dermatitis, unspecified: Secondary | ICD-10-CM | POA: Diagnosis not present

## 2014-11-06 ENCOUNTER — Ambulatory Visit
Admission: RE | Admit: 2014-11-06 | Discharge: 2014-11-06 | Disposition: A | Discharge status: Home / Self Care | Payer: 59 | Source: Ambulatory Visit | Attending: Orthopedic Surgery | Admitting: Orthopedic Surgery

## 2014-11-06 DIAGNOSIS — M4806 Spinal stenosis, lumbar region: Secondary | ICD-10-CM | POA: Diagnosis not present

## 2014-11-06 DIAGNOSIS — M4726 Other spondylosis with radiculopathy, lumbar region: Secondary | ICD-10-CM | POA: Insufficient documentation

## 2014-11-06 DIAGNOSIS — M4807 Spinal stenosis, lumbosacral region: Secondary | ICD-10-CM | POA: Diagnosis not present

## 2014-11-06 DIAGNOSIS — M79605 Pain in left leg: Secondary | ICD-10-CM | POA: Diagnosis present

## 2014-11-11 DIAGNOSIS — H52223 Regular astigmatism, bilateral: Secondary | ICD-10-CM | POA: Diagnosis not present

## 2014-11-11 DIAGNOSIS — H25813 Combined forms of age-related cataract, bilateral: Secondary | ICD-10-CM | POA: Diagnosis not present

## 2014-11-11 DIAGNOSIS — Z79899 Other long term (current) drug therapy: Secondary | ICD-10-CM | POA: Diagnosis not present

## 2014-11-11 DIAGNOSIS — H5203 Hypermetropia, bilateral: Secondary | ICD-10-CM | POA: Diagnosis not present

## 2014-11-11 DIAGNOSIS — M3210 Systemic lupus erythematosus, organ or system involvement unspecified: Secondary | ICD-10-CM | POA: Diagnosis not present

## 2014-11-13 ENCOUNTER — Ambulatory Visit: Payer: Self-pay | Admitting: Family Medicine

## 2014-11-15 ENCOUNTER — Ambulatory Visit (INDEPENDENT_AMBULATORY_CARE_PROVIDER_SITE_OTHER): Payer: 59 | Admitting: Family Medicine

## 2014-11-15 ENCOUNTER — Encounter: Payer: Self-pay | Admitting: Family Medicine

## 2014-11-15 ENCOUNTER — Ambulatory Visit: Payer: 59 | Admitting: Family Medicine

## 2014-11-15 VITALS — BP 131/87 | HR 69 | Temp 98.1°F | Resp 16 | Ht 62.0 in | Wt 179.2 lb

## 2014-11-15 DIAGNOSIS — G4733 Obstructive sleep apnea (adult) (pediatric): Secondary | ICD-10-CM | POA: Diagnosis not present

## 2014-11-15 DIAGNOSIS — M5442 Lumbago with sciatica, left side: Secondary | ICD-10-CM

## 2014-11-15 DIAGNOSIS — Z23 Encounter for immunization: Secondary | ICD-10-CM

## 2014-11-15 DIAGNOSIS — Z9989 Dependence on other enabling machines and devices: Secondary | ICD-10-CM

## 2014-11-15 DIAGNOSIS — D329 Benign neoplasm of meninges, unspecified: Secondary | ICD-10-CM | POA: Diagnosis not present

## 2014-11-15 DIAGNOSIS — R7302 Impaired glucose tolerance (oral): Secondary | ICD-10-CM

## 2014-11-15 DIAGNOSIS — K219 Gastro-esophageal reflux disease without esophagitis: Secondary | ICD-10-CM | POA: Diagnosis not present

## 2014-11-15 DIAGNOSIS — L930 Discoid lupus erythematosus: Secondary | ICD-10-CM | POA: Diagnosis not present

## 2014-11-15 DIAGNOSIS — Z1159 Encounter for screening for other viral diseases: Secondary | ICD-10-CM | POA: Diagnosis not present

## 2014-11-15 DIAGNOSIS — Z114 Encounter for screening for human immunodeficiency virus [HIV]: Secondary | ICD-10-CM

## 2014-11-15 DIAGNOSIS — I10 Essential (primary) hypertension: Secondary | ICD-10-CM

## 2014-11-15 LAB — CBC WITH DIFFERENTIAL/PLATELET
BASOS ABS: 0 10*3/uL (ref 0.0–0.1)
BASOS PCT: 0 % (ref 0–1)
EOS ABS: 0 10*3/uL (ref 0.0–0.7)
Eosinophils Relative: 0 % (ref 0–5)
HCT: 39.6 % (ref 36.0–46.0)
HEMOGLOBIN: 13.5 g/dL (ref 12.0–15.0)
Lymphocytes Relative: 13 % (ref 12–46)
Lymphs Abs: 2.4 10*3/uL (ref 0.7–4.0)
MCH: 31.7 pg (ref 26.0–34.0)
MCHC: 34.1 g/dL (ref 30.0–36.0)
MCV: 93 fL (ref 78.0–100.0)
MONOS PCT: 4 % (ref 3–12)
MPV: 9.4 fL (ref 8.6–12.4)
Monocytes Absolute: 0.7 10*3/uL (ref 0.1–1.0)
NEUTROS ABS: 15.2 10*3/uL — AB (ref 1.7–7.7)
NEUTROS PCT: 83 % — AB (ref 43–77)
PLATELETS: 308 10*3/uL (ref 150–400)
RBC: 4.26 MIL/uL (ref 3.87–5.11)
RDW: 13.9 % (ref 11.5–15.5)
WBC: 18.3 10*3/uL — ABNORMAL HIGH (ref 4.0–10.5)

## 2014-11-15 LAB — COMPREHENSIVE METABOLIC PANEL
ALBUMIN: 3.9 g/dL (ref 3.6–5.1)
ALK PHOS: 47 U/L (ref 33–130)
ALT: 9 U/L (ref 6–29)
AST: 17 U/L (ref 10–35)
BILIRUBIN TOTAL: 0.5 mg/dL (ref 0.2–1.2)
BUN: 10 mg/dL (ref 7–25)
CHLORIDE: 99 mmol/L (ref 98–110)
CO2: 27 mmol/L (ref 20–31)
CREATININE: 0.64 mg/dL (ref 0.50–0.99)
Calcium: 9.4 mg/dL (ref 8.6–10.4)
Glucose, Bld: 104 mg/dL — ABNORMAL HIGH (ref 65–99)
Potassium: 3.6 mmol/L (ref 3.5–5.3)
SODIUM: 140 mmol/L (ref 135–146)
TOTAL PROTEIN: 6.8 g/dL (ref 6.1–8.1)

## 2014-11-15 LAB — HEMOGLOBIN A1C
HEMOGLOBIN A1C: 6.1 % — AB (ref <5.7)
MEAN PLASMA GLUCOSE: 128 mg/dL — AB (ref <117)

## 2014-11-15 MED ORDER — HYDROCODONE-ACETAMINOPHEN 5-325 MG PO TABS: 1.0000 | ORAL_TABLET | Freq: Four times a day (QID) | ORAL | Status: DC | PRN

## 2014-11-15 NOTE — Progress Notes (Signed)
Subjective:    Patient ID: Maureen Hodge, female    DOB: [birth year], 64 y.o.   MRN: [id number]  11/15/2014  Follow-up and Medication Refill   HPI This 65 y.o. female presents for four month follow-up:   1. HTN: Patient reports good compliance with medication, good tolerance to medication, and good symptom control.  Not checking BP at home.    2. Glucose Intolerance:  Weight down four pounds in past four months.  Doing Slim Fast drinks two meals per day.   3. Excessive sweating: increasing HRT to 2mg  daily. No improvement with increased Estrace dose; even took Oceanographer and Estrace.  Sweating for years.   Sweats from head.  S/p TSH WNL; s/p Tb Gold that is negative; cancer screens UTD.  Has been occurring for years. Stopping testosterone supplementation did not help.    4.  GERD: Patient reports good compliance with medication, good tolerance to medication, and good symptom control.    5. Skin lesions: s/p biopsy by dermatology; consistent with insect bite; onset two weeks ago.  S/p dermatology visit x 2.  Extremely itchy.    6. Obesity: down 11 pounds in past nine months.  Trying to lose weight; stopped eating as much; Slim Fast for breakfast and lunch; decreased portion sizes in evenings.  Cutting out junk foods; decreased snacking.    7. Low back pain: s/p MRI on 11/06/14.  Maureen Hodge. Having radicular symptoms; requesting refill of Tramadol.     Review of Systems  Constitutional: Positive for diaphoresis. Negative for fever, chills and fatigue.  HENT: Negative for congestion, ear pain, postnasal drip, rhinorrhea and sore throat.   Eyes: Negative for visual disturbance.  Respiratory: Negative for cough and shortness of breath.   Cardiovascular: Negative for chest pain, palpitations and leg swelling.  Gastrointestinal: Negative for nausea, vomiting, abdominal pain, diarrhea and constipation.  Endocrine: Negative for cold intolerance, heat intolerance, polydipsia, polyphagia  and polyuria.  Musculoskeletal: Positive for myalgias and back pain.  Skin: Positive for rash.  Neurological: Negative for dizziness, tremors, seizures, syncope, facial asymmetry, speech difficulty, weakness, light-headedness, numbness and headaches.    Past Medical History  Diagnosis Date  . Migraines   . Hypertension   . PLMD (periodic limb movement disorder)   . Diabetes mellitus     Pre-diabetes  . Brain tumor ([place]) 05/2011    s/p resection  [facility].  . Lupus erythematosus   . Diffuse cystic mastopathy   . Other convulsions   . Intervertebral disc disorder with myelopathy, unspecified region   . Cerebral aneurysm, nonruptured   . Esophageal reflux   . Hypopotassemia 06/01/2007  . Unspecified sleep apnea     CPAP titrated to 7cm  . Allergy   . S/P bunionectomy 09/28/2013    [facility].  Right.   Past Surgical History  Procedure Laterality Date  . Total abdominal hysterectomy  1979    postpartum hemorrhage ovaries intact  . Dilation and curettage of uterus      x 5  . Hemorrhoid surgery    . Breast surgery      x 5 due to fibrocystic disease  . Foot surgery    . Brain surgery  2013  . Lumbar surgery  2000  . Brain avm repair    . Tonsillectomy and adenoidectomy    . Spine surgery     Allergies  Allergen Reactions  . Lisinopril Swelling    Face and lips   Current Outpatient Prescriptions  Medication Sig  Dispense Refill  . albuterol (PROVENTIL HFA;VENTOLIN HFA) 108 (90 BASE) MCG/ACT inhaler Inhale 2 puffs into the lungs every 6 (six) hours as needed for wheezing or shortness of breath (cough, shortness of breath or wheezing.). 1 Inhaler 0  . atenolol (TENORMIN) 25 MG tablet TAKE 2 TABLETS BY MOUTH EVERY MORNING AND TAKE 1 TABLET EVERY EVENING 270 tablet 3  . estradiol (ESTRACE) 2 MG tablet Take 1 tablet (2 mg total) by mouth daily. 90 tablet 1  . furosemide (LASIX) 40 MG tablet Take 1 tablet (40 mg total) by mouth daily. 90 tablet 1  . gabapentin (NEURONTIN)  800 MG tablet TAKE 1 TABLET (800 MG TOTAL) BY MOUTH 4 (FOUR) TIMES DAILY. 360 tablet 3  . hydroxychloroquine (PLAQUENIL) 200 MG tablet Take 200 mg by mouth 2 (two) times daily.      . mometasone (ELOCON) 0.1 % ointment Apply topically Twice daily as needed.    . nitroGLYCERIN (NITROSTAT) 0.4 MG SL tablet Place 1 tablet (0.4 mg total) under the tongue every 5 (five) minutes as needed for chest pain. 30 tablet 0  . nortriptyline (PAMELOR) 25 MG capsule Take 1 capsule (25 mg total) by mouth 3 (three) times daily. 90 capsule 3  . pantoprazole (PROTONIX) 40 MG tablet Take 1 tablet (40 mg total) by mouth daily. 90 tablet 3  . potassium chloride SA (KLOR-CON M20) 20 MEQ tablet Take 2 tablets (40 mEq total) by mouth 2 (two) times daily. 360 tablet 3  . triamterene-hydrochlorothiazide (MAXZIDE-25) 37.5-25 MG per tablet TAKE 1 TABLET BY MOUTH DAILY. 90 tablet 3  . zoster vaccine live, PF, (ZOSTAVAX) [address] UNT/0.65ML injection Inject 19,400 Units into the skin once. 0.65 mL 0  . HYDROcodone-acetaminophen (NORCO/VICODIN) 5-325 MG tablet Take 1 tablet by mouth every 6 (six) hours as needed for moderate pain. 60 tablet 0   No current facility-administered medications for this visit.   Social History   Social History  . Marital Status: Married    Spouse Name: N/A  . Number of Children: 2  . Years of Education: N/A   Occupational History  . Retired    Social History Main Topics  . Smoking status: Never Smoker   . Smokeless tobacco: Never Used  . Alcohol Use: No  . Drug Use: No  . Sexual Activity: Yes   Other Topics Concern  . Not on file   Social History Narrative   Marital status:  Married  X 43 years, happily , no abuse.       Children:  2 children; 4 grandchildren.      Lives: with husband.      Employment: disability.  Works part-time at [employer]; working PRN.      Tobacco: never      Alcohol: wine once per month      Drugs: none     Exercise: sporadic in 2016; walking some.      Caffeine Use: Some.    Seatbelt:  Always uses seat belts.       Smoke alarm in the home.   Family History  Problem Relation Age of Onset  . Heart disease Mother     CHF  . Heart failure Mother     CHF  . Hypertension Mother   . Stroke Mother   . Cancer Mother 74    unknown primary  . Coronary artery disease Other     family history  . Cancer Other     family history  . Hypertension Sister   .  Crohn's disease Sister   . Arthritis Sister     partial knee replacement.  . Diabetes Daughter        Objective:    BP 131/87 mmHg  Pulse 69  Temp(Src) 98.1 F (36.7 C) (Oral)  Resp 16  Ht 5\' 2"  (1.575 m)  Wt 179 lb 3.2 oz (81.285 kg)  BMI 32.77 kg/m2 Physical Exam  Constitutional: She is oriented to person, place, and time. She appears well-developed and well-nourished. No distress.  HENT:  Head: Normocephalic and atraumatic.  Right Ear: External ear normal.  Left Ear: External ear normal.  Nose: Nose normal.  Mouth/Throat: Oropharynx is clear and moist.  Eyes: Conjunctivae and EOM are normal. Pupils are equal, round, and reactive to light.  Neck: Normal range of motion. Neck supple. Carotid bruit is not present. No thyromegaly present.  Cardiovascular: Normal rate, regular rhythm, normal heart sounds and intact distal pulses.  Exam reveals no gallop and no friction rub.   No murmur heard. Pulmonary/Chest: Effort normal and breath sounds normal. She has no wheezes. She has no rales.  Abdominal: Soft. Bowel sounds are normal. She exhibits no distension and no mass. There is no tenderness. There is no rebound and no guarding.  Lymphadenopathy:    She has no cervical adenopathy.  Neurological: She is alert and oriented to person, place, and time. No cranial nerve deficit.  Skin: Skin is warm and dry. Rash noted. She is not diaphoretic. No erythema. No pallor.  Scattered lesions along arms; scaling lesions along external ear anatomy.  Psychiatric: She has a normal mood and  affect. Her behavior is normal.   INFLUENZA VACCINE ADMINISTERED.     Assessment & Plan:   1. HYPERTENSION, BENIGN   2. Gastroesophageal reflux disease without esophagitis   3. Meningioma ([place])   4. OSA on CPAP   5. Discoid lupus   6. Glucose intolerance (impaired glucose tolerance)   7. Screening for HIV (human immunodeficiency virus)   8. Need for hepatitis C screening test   9. Need for prophylactic vaccination and inoculation against influenza   10. Left-sided low back pain with left-sided sciatica     1. HTN: controlled; obtain labs; continue current medications. 2.  GERD: controlled; no changes to management. 3.  OSA on CpAP: stable; no changes to management; continue with weight loss. 4.  Discoid lupus: stable; obtain labs; managed by dermatology; maintianed on Plaquenil.  5.  Glucose intolerance: anticipate improvement due to weight loss. 6.  Screening HIV: obtain labs. 7. Screening Hepatitis C: obtain labs. 8.  L sided lower back pain with sciatica: recurrent; s/p MRI; awaiting ortho recommendations; rx for hydrocodone provided due to history of seizures; avoid Tramadol which lowers seizure threshold. 9.  S/p flu vaccine.  Orders Placed This Encounter  Procedures  . Flu Vaccine QUAD 36+ mos IM  . CBC with Differential/Platelet  . Comprehensive metabolic panel  . Hemoglobin A1c  . HIV antibody  . Hepatitis C antibody   Meds ordered this encounter  Medications  . HYDROcodone-acetaminophen (NORCO/VICODIN) 5-325 MG tablet    Sig: Take 1 tablet by mouth every 6 (six) hours as needed for moderate pain.    Dispense:  60 tablet    Refill:  0    Return in about 4 months (around 03/15/2015) for complete physical examiniation.     Maureen Hodge Elayne Guerin, M.D. [facility] [address] [place], [place]  [address] [phone] phone [phone] fax

## 2014-11-16 LAB — HIV ANTIBODY (ROUTINE TESTING W REFLEX): HIV 1&2 Ab, 4th Generation: NONREACTIVE

## 2014-11-16 LAB — HEPATITIS C ANTIBODY: HCV Ab: NEGATIVE

## 2014-11-20 DIAGNOSIS — L308 Other specified dermatitis: Secondary | ICD-10-CM | POA: Diagnosis not present

## 2014-11-20 DIAGNOSIS — L309 Dermatitis, unspecified: Secondary | ICD-10-CM | POA: Diagnosis not present

## 2014-11-20 DIAGNOSIS — Z23 Encounter for immunization: Secondary | ICD-10-CM | POA: Diagnosis not present

## 2014-12-01 DIAGNOSIS — M5410 Radiculopathy, site unspecified: Secondary | ICD-10-CM | POA: Diagnosis not present

## 2014-12-01 DIAGNOSIS — M79605 Pain in left leg: Secondary | ICD-10-CM | POA: Diagnosis not present

## 2014-12-01 DIAGNOSIS — M25562 Pain in left knee: Secondary | ICD-10-CM | POA: Diagnosis not present

## 2014-12-01 DIAGNOSIS — M25552 Pain in left hip: Secondary | ICD-10-CM | POA: Diagnosis not present

## 2014-12-04 DIAGNOSIS — M5416 Radiculopathy, lumbar region: Secondary | ICD-10-CM | POA: Diagnosis not present

## 2014-12-04 DIAGNOSIS — M4806 Spinal stenosis, lumbar region: Secondary | ICD-10-CM | POA: Diagnosis not present

## 2014-12-19 DIAGNOSIS — Z79899 Other long term (current) drug therapy: Secondary | ICD-10-CM | POA: Diagnosis not present

## 2014-12-19 DIAGNOSIS — Z23 Encounter for immunization: Secondary | ICD-10-CM | POA: Diagnosis not present

## 2014-12-19 DIAGNOSIS — L309 Dermatitis, unspecified: Secondary | ICD-10-CM | POA: Diagnosis not present

## 2014-12-26 ENCOUNTER — Other Ambulatory Visit: Payer: Self-pay | Admitting: Surgery

## 2014-12-26 DIAGNOSIS — Z1231 Encounter for screening mammogram for malignant neoplasm of breast: Secondary | ICD-10-CM

## 2015-01-08 ENCOUNTER — Other Ambulatory Visit: Payer: Self-pay | Admitting: Family Medicine

## 2015-01-10 DIAGNOSIS — M5416 Radiculopathy, lumbar region: Secondary | ICD-10-CM | POA: Diagnosis not present

## 2015-01-10 DIAGNOSIS — M5116 Intervertebral disc disorders with radiculopathy, lumbar region: Secondary | ICD-10-CM | POA: Insufficient documentation

## 2015-01-10 DIAGNOSIS — M4806 Spinal stenosis, lumbar region: Secondary | ICD-10-CM | POA: Diagnosis not present

## 2015-01-15 DIAGNOSIS — L308 Other specified dermatitis: Secondary | ICD-10-CM | POA: Diagnosis not present

## 2015-01-15 DIAGNOSIS — L309 Dermatitis, unspecified: Secondary | ICD-10-CM | POA: Diagnosis not present

## 2015-01-15 DIAGNOSIS — Z79899 Other long term (current) drug therapy: Secondary | ICD-10-CM | POA: Diagnosis not present

## 2015-01-15 DIAGNOSIS — A6920 Lyme disease, unspecified: Secondary | ICD-10-CM | POA: Diagnosis not present

## 2015-01-24 ENCOUNTER — Ambulatory Visit
Admission: RE | Admit: 2015-01-24 | Discharge: 2015-01-24 | Disposition: A | Discharge status: Home / Self Care | Payer: 59 | Source: Ambulatory Visit | Attending: Surgery | Admitting: Surgery

## 2015-01-24 DIAGNOSIS — Z1231 Encounter for screening mammogram for malignant neoplasm of breast: Secondary | ICD-10-CM | POA: Insufficient documentation

## 2015-01-29 DIAGNOSIS — L270 Generalized skin eruption due to drugs and medicaments taken internally: Secondary | ICD-10-CM | POA: Diagnosis not present

## 2015-01-29 DIAGNOSIS — L309 Dermatitis, unspecified: Secondary | ICD-10-CM | POA: Diagnosis not present

## 2015-02-03 DIAGNOSIS — Z9889 Other specified postprocedural states: Secondary | ICD-10-CM | POA: Diagnosis not present

## 2015-02-03 DIAGNOSIS — Z86018 Personal history of other benign neoplasm: Secondary | ICD-10-CM | POA: Diagnosis not present

## 2015-02-03 DIAGNOSIS — N6012 Diffuse cystic mastopathy of left breast: Secondary | ICD-10-CM | POA: Diagnosis not present

## 2015-02-04 ENCOUNTER — Telehealth: Payer: Self-pay

## 2015-02-04 NOTE — Telephone Encounter (Signed)
Pt is needing to talk to dr Tamala Julian about her diagnosis of lyme disease the doctor she saw for this states it is not that but she still has a problem   Best number [phone]

## 2015-02-05 ENCOUNTER — Telehealth: Payer: Self-pay

## 2015-02-05 NOTE — Telephone Encounter (Signed)
Maureen Hodge from dr Valinda Hoar  From [facility] -wants to talk with dr Tamala Julian she feels the rash this patient has might be related to a medication that dr Tamala Julian is prescribing   Best number [phone] medical assistant Angus Palms

## 2015-02-06 NOTE — Telephone Encounter (Signed)
Left message with Dr. Jimmye Norman' office to discuss medication concern.

## 2015-02-07 DIAGNOSIS — L503 Dermatographic urticaria: Secondary | ICD-10-CM | POA: Diagnosis not present

## 2015-02-07 DIAGNOSIS — L309 Dermatitis, unspecified: Secondary | ICD-10-CM | POA: Diagnosis not present

## 2015-02-11 DIAGNOSIS — L309 Dermatitis, unspecified: Secondary | ICD-10-CM | POA: Diagnosis not present

## 2015-02-13 NOTE — Telephone Encounter (Signed)
Please call patient for details

## 2015-02-18 DIAGNOSIS — L819 Disorder of pigmentation, unspecified: Secondary | ICD-10-CM | POA: Diagnosis not present

## 2015-02-18 DIAGNOSIS — L309 Dermatitis, unspecified: Secondary | ICD-10-CM | POA: Diagnosis not present

## 2015-02-18 NOTE — Telephone Encounter (Signed)
lmom to cb to get more information.

## 2015-02-25 DIAGNOSIS — M4726 Other spondylosis with radiculopathy, lumbar region: Secondary | ICD-10-CM | POA: Diagnosis not present

## 2015-02-25 DIAGNOSIS — M4316 Spondylolisthesis, lumbar region: Secondary | ICD-10-CM | POA: Diagnosis not present

## 2015-03-05 DIAGNOSIS — L309 Dermatitis, unspecified: Secondary | ICD-10-CM | POA: Diagnosis not present

## 2015-03-06 DIAGNOSIS — L930 Discoid lupus erythematosus: Secondary | ICD-10-CM | POA: Diagnosis not present

## 2015-03-13 DIAGNOSIS — T887XXA Unspecified adverse effect of drug or medicament, initial encounter: Secondary | ICD-10-CM | POA: Diagnosis not present

## 2015-03-20 DIAGNOSIS — T7840XD Allergy, unspecified, subsequent encounter: Secondary | ICD-10-CM | POA: Diagnosis not present

## 2015-03-31 ENCOUNTER — Ambulatory Visit (INDEPENDENT_AMBULATORY_CARE_PROVIDER_SITE_OTHER): Payer: 59 | Admitting: Family Medicine

## 2015-03-31 VITALS — BP 124/88 | HR 72 | Temp 98.2°F | Resp 16 | Ht 62.0 in | Wt 182.0 lb

## 2015-03-31 DIAGNOSIS — I10 Essential (primary) hypertension: Secondary | ICD-10-CM | POA: Diagnosis not present

## 2015-03-31 DIAGNOSIS — R21 Rash and other nonspecific skin eruption: Secondary | ICD-10-CM

## 2015-03-31 MED ORDER — AMLODIPINE BESYLATE 10 MG PO TABS: 10.0000 mg | ORAL_TABLET | Freq: Every day | ORAL | Status: DC

## 2015-03-31 NOTE — Patient Instructions (Addendum)
     IF you received an x-ray today, you will receive an invoice from [facility]. Please contact [facility] at [phone] with questions or concerns regarding your invoice.   IF you received labwork today, you will receive an invoice from [employer]. Please contact [facility] at [phone] with questions or concerns regarding your invoice.   Our billing staff will not be able to assist you with questions regarding bills from these companies.  You will be contacted with the lab results as soon as they are available. The fastest way to get your results is to activate your My Chart account. Instructions are located on the last page of this paperwork. If you have not heard from [place] regarding the results in 2 weeks, please contact this office.     1. Start Amlodipine 10mg   --- 1/2 tablet daily.   2.  Stop Triamterene-HCTZ 37.5-25. 3.  Stop Potassium tablet. 4.  Check BP daily; call if BP remains> 140/90.  We may need to increase to one whole tablet of Amlodipine.

## 2015-03-31 NOTE — Progress Notes (Signed)
Subjective:    Patient ID: Maureen Hodge, female    DOB: 10-Jan-1950, 65 y.o.   MRN: [id number]  03/31/2015  Rash   HPI This 65 y.o. female presents for evaluation of rash.  S/p four biopsies negative; went for second opinion. S/p rheumatoid arhtritis provider; went to [facility] dermatology; then saw [facility]; also saw Valinda Hoar.   Feel drug reaction.  Discussed drug reaction of HRT versus HCTZ.  Horribly itchy; restarted Prednisone two days ago.  Rheumatologist recommended discontinuing Prednsione but had tro restart due to recurrent itching.  Itching since October.  FOrearms mostly and legs; initially thought Lyme's disease; treated for Lyme;s disease; has taken a lot of abx.  Swelling with Lisinopril but had been taking with angioedema.  Does not take Lasix often; rarely takes; no recent swelling issues.   Increased Estradiol in July 2017 to 59m.    Night sweats: switched to Estradiol 217mdaily; sweating more.  No nighttime sweats; sweat more during the day. Sweating has worsened in the past month.    Review of Systems  Constitutional: Negative for fever, chills, diaphoresis and fatigue.  Eyes: Negative for visual disturbance.  Respiratory: Negative for cough and shortness of breath.   Cardiovascular: Negative for chest pain, palpitations and leg swelling.  Gastrointestinal: Negative for nausea, vomiting, abdominal pain, diarrhea and constipation.  Endocrine: Negative for cold intolerance, heat intolerance, polydipsia, polyphagia and polyuria.  Neurological: Negative for dizziness, tremors, seizures, syncope, facial asymmetry, speech difficulty, weakness, light-headedness, numbness and headaches.    Past Medical History  Diagnosis Date  . Migraines   . Hypertension   . PLMD (periodic limb movement disorder)   . Diabetes mellitus     Pre-diabetes  . Brain tumor ([facility]    s/p resection  [facility].  . Lupus erythematosus   . Diffuse cystic mastopathy    . Other convulsions   . Intervertebral disc disorder with myelopathy, unspecified region   . Cerebral aneurysm, nonruptured   . Esophageal reflux   . Hypopotassemia 06/01/2007  . Unspecified sleep apnea     CPAP titrated to 7cm  . Allergy   . S/P bunionectomy 09/28/2013    [facility].  Right.   Past Surgical History  Procedure Laterality Date  . Total abdominal hysterectomy  1979    postpartum hemorrhage ovaries intact  . Dilation and curettage of uterus      x 5  . Hemorrhoid surgery    . Breast surgery      x 5 due to fibrocystic disease  . Foot surgery    . Brain surgery  2013  . Lumbar surgery  2000  . Brain avm repair    . Tonsillectomy and adenoidectomy    . Spine surgery    . Breast biopsy Bilateral     neg  . Breast cyst aspiration Bilateral    Allergies  Allergen Reactions  . Lisinopril Swelling    Face and lips    Social History   Social History  . Marital Status: Married    Spouse Name: N/A  . Number of Children: 2  . Years of Education: N/A   Occupational History  . Retired    Social History Main Topics  . Smoking status: Never Smoker   . Smokeless tobacco: Never Used  . Alcohol Use: No  . Drug Use: No  . Sexual Activity: Yes   Other Topics Concern  . Not on file   Social History Narrative   Marital status:  Married  X 43 years, happily , no abuse.       Children:  2 children; 4 grandchildren.      Lives: with husband.      Employment: disability.  Works part-time at [employer]; working PRN.      Tobacco: never      Alcohol: wine once per month      Drugs: none     Exercise: sporadic in 2016; walking some.     Caffeine Use: Some.    Seatbelt:  Always uses seat belts.       Smoke alarm in the home.   Family History  Problem Relation Age of Onset  . Heart disease Mother     CHF  . Heart failure Mother     CHF  . Hypertension Mother   . Stroke Mother   . Cancer Mother 75    unknown primary  . Coronary artery disease Other      family history  . Cancer Other     family history  . Hypertension Sister   . Crohn's disease Sister   . Arthritis Sister     partial knee replacement.  . Diabetes Daughter   . Breast cancer Paternal Aunt        Objective:    BP 124/88 mmHg  Pulse 72  Temp(Src) 98.2 F (36.8 C)  Resp 16  Ht _0  (1.575 m)  Wt 182 lb (82.555 kg)  BMI 33.28 kg/m2  SpO2 96% Physical Exam  Constitutional: She is oriented to person, place, and time. She appears well-developed and well-nourished. No distress.  HENT:  Head: Normocephalic and atraumatic.  Right Ear: External ear normal.  Left Ear: External ear normal.  Nose: Nose normal.  Mouth/Throat: Oropharynx is clear and moist.  Eyes: Conjunctivae and EOM are normal. Pupils are equal, round, and reactive to light.  Neck: Normal range of motion. Neck supple. Carotid bruit is not present. No thyromegaly present.  Cardiovascular: Normal rate, regular rhythm, normal heart sounds and intact distal pulses.  Exam reveals no gallop and no friction rub.   No murmur heard. Pulmonary/Chest: Effort normal and breath sounds normal. She has no wheezes. She has no rales.  Abdominal: Soft. Bowel sounds are normal. She exhibits no distension and no mass. There is no tenderness. There is no rebound and no guarding.  Lymphadenopathy:    She has no cervical adenopathy.  Neurological: She is alert and oriented to person, place, and time. No cranial nerve deficit.  Skin: Skin is warm and dry. Rash noted. She is not diaphoretic. No erythema. No pallor.  Rash along forearms B.  Psychiatric: She has a normal mood and affect. Her behavior is normal.   Results for orders placed or performed in visit on 11/15/14  CBC with Differential/Platelet  Result Value Ref Range   WBC 18.3 (H) 4.0 - 10.5 K/uL   RBC 4.26 3.87 - 5.11 MIL/uL   Hemoglobin 13.5 12.0 - 15.0 g/dL   HCT 39.6 36.0 - 46.0 %   MCV 93.0 78.0 - 100.0 fL   MCH 31.7 26.0 - 34.0 pg   MCHC 34.1 30.0 - 36.0  g/dL   RDW 13.9 11.5 - 15.5 %   Platelets 308 150 - 400 K/uL   MPV 9.4 8.6 - 12.4 fL   Neutrophils Relative % 83 (H) 43 - 77 %   Neutro Abs 15.2 (H) 1.7 - 7.7 K/uL   Lymphocytes Relative 13 12 - 46 %   Lymphs  Abs 2.4 0.7 - 4.0 K/uL   Monocytes Relative 4 3 - 12 %   Monocytes Absolute 0.7 0.1 - 1.0 K/uL   Eosinophils Relative 0 0 - 5 %   Eosinophils Absolute 0.0 0.0 - 0.7 K/uL   Basophils Relative 0 0 - 1 %   Basophils Absolute 0.0 0.0 - 0.1 K/uL   Smear Review Criteria for review not met   Comprehensive metabolic panel  Result Value Ref Range   Sodium 140 135 - 146 mmol/L   Potassium 3.6 3.5 - 5.3 mmol/L   Chloride 99 98 - 110 mmol/L   CO2 27 20 - 31 mmol/L   Glucose, Bld 104 (H) 65 - 99 mg/dL   BUN 10 7 - 25 mg/dL   Creat 0.64 0.50 - 0.99 mg/dL   Total Bilirubin 0.5 0.2 - 1.2 mg/dL   Alkaline Phosphatase 47 33 - 130 U/L   AST 17 10 - 35 U/L   ALT 9 6 - 29 U/L   Total Protein 6.8 6.1 - 8.1 g/dL   Albumin 3.9 3.6 - 5.1 g/dL   Calcium 9.4 8.6 - 10.4 mg/dL  Hemoglobin A1c  Result Value Ref Range   Hgb A1c MFr Bld 6.1 (H) <5.7 %   Mean Plasma Glucose 128 (H) <117 mg/dL  HIV antibody  Result Value Ref Range   HIV 1&2 Ab, 4th Generation NONREACTIVE NONREACTIVE  Hepatitis C antibody  Result Value Ref Range   HCV Ab NEGATIVE NEGATIVE       Assessment & Plan:   1. Rash and nonspecific skin eruption   2. Essential hypertension    -stop HCTZ at recommendation of derm. -start Amlodipine 22m 1/2 daily. Monitor BP closely; can increase to 1 tablet daily if BP> 140/90.   No orders of the defined types were placed in this encounter.   Meds ordered this encounter  Medications  . fexofenadine (ALLEGRA) 180 MG tablet    Sig: Take 180 mg by mouth daily.  . cetirizine (ZYRTEC) 10 MG tablet    Sig: Take 10 mg by mouth daily.  . predniSONE (DELTASONE) 10 MG tablet    Sig: Take 10 mg by mouth daily with breakfast.  . amLODipine (NORVASC) 10 MG tablet    Sig: Take 1 tablet (10  mg total) by mouth daily.    Dispense:  90 tablet    Refill:  1    No Follow-up on file.    Marcene Laskowski MElayne Guerin M.D. [id number] [place]  [phone]

## 2015-04-08 DIAGNOSIS — L718 Other rosacea: Secondary | ICD-10-CM | POA: Diagnosis not present

## 2015-04-08 DIAGNOSIS — T887XXD Unspecified adverse effect of drug or medicament, subsequent encounter: Secondary | ICD-10-CM | POA: Diagnosis not present

## 2015-04-18 ENCOUNTER — Encounter: Payer: 59 | Admitting: Family Medicine

## 2015-04-22 ENCOUNTER — Telehealth: Payer: Self-pay

## 2015-04-22 NOTE — Telephone Encounter (Signed)
Patient wanted to let Dr Tamala Julian know that her BP is good but that her ankles are really swollen, and she doesn't know what to do about them, she would like someone to call her and talk to her about this.  Her call back number is [phone]

## 2015-04-24 NOTE — Telephone Encounter (Signed)
Call --- 1. How is rash?  2.  What are blood pressures running?

## 2015-04-24 NOTE — Telephone Encounter (Signed)
Spoke with pt, her blood pressure has been good. Around 123/84. The rash has not gone away.

## 2015-05-02 NOTE — Telephone Encounter (Signed)
Left message on voicemail; wanted to follow-up on blood pressure readings, leg swelling, and rash.

## 2015-05-06 DIAGNOSIS — L718 Other rosacea: Secondary | ICD-10-CM | POA: Diagnosis not present

## 2015-05-06 DIAGNOSIS — Z79899 Other long term (current) drug therapy: Secondary | ICD-10-CM | POA: Diagnosis not present

## 2015-05-06 DIAGNOSIS — T887XXD Unspecified adverse effect of drug or medicament, subsequent encounter: Secondary | ICD-10-CM | POA: Diagnosis not present

## 2015-05-09 ENCOUNTER — Other Ambulatory Visit: Payer: Self-pay | Admitting: Family Medicine

## 2015-05-12 DIAGNOSIS — H5203 Hypermetropia, bilateral: Secondary | ICD-10-CM | POA: Diagnosis not present

## 2015-05-12 DIAGNOSIS — Z79899 Other long term (current) drug therapy: Secondary | ICD-10-CM | POA: Diagnosis not present

## 2015-05-12 DIAGNOSIS — H524 Presbyopia: Secondary | ICD-10-CM | POA: Diagnosis not present

## 2015-05-12 DIAGNOSIS — H25813 Combined forms of age-related cataract, bilateral: Secondary | ICD-10-CM | POA: Diagnosis not present

## 2015-05-12 DIAGNOSIS — M3210 Systemic lupus erythematosus, organ or system involvement unspecified: Secondary | ICD-10-CM | POA: Diagnosis not present

## 2015-05-12 DIAGNOSIS — H52223 Regular astigmatism, bilateral: Secondary | ICD-10-CM | POA: Diagnosis not present

## 2015-05-12 NOTE — Telephone Encounter (Signed)
Dr Tamala Julian, you saw pt for HTN check up in March, but don't see this med discussed recently. Do you want to RF or have pt RTC?

## 2015-05-13 ENCOUNTER — Ambulatory Visit (INDEPENDENT_AMBULATORY_CARE_PROVIDER_SITE_OTHER): Payer: 59 | Admitting: Family Medicine

## 2015-05-13 ENCOUNTER — Encounter: Payer: Self-pay | Admitting: Family Medicine

## 2015-05-13 VITALS — BP 110/90 | HR 69 | Temp 97.7°F | Resp 16 | Ht 61.0 in | Wt 181.8 lb

## 2015-05-13 DIAGNOSIS — Z1211 Encounter for screening for malignant neoplasm of colon: Secondary | ICD-10-CM

## 2015-05-13 DIAGNOSIS — Z0000 Encounter for general adult medical examination without abnormal findings: Secondary | ICD-10-CM

## 2015-05-13 DIAGNOSIS — Q283 Other malformations of cerebral vessels: Secondary | ICD-10-CM | POA: Diagnosis not present

## 2015-05-13 DIAGNOSIS — Z23 Encounter for immunization: Secondary | ICD-10-CM | POA: Diagnosis not present

## 2015-05-13 DIAGNOSIS — G4733 Obstructive sleep apnea (adult) (pediatric): Secondary | ICD-10-CM

## 2015-05-13 DIAGNOSIS — Z13220 Encounter for screening for lipoid disorders: Secondary | ICD-10-CM | POA: Diagnosis not present

## 2015-05-13 DIAGNOSIS — K219 Gastro-esophageal reflux disease without esophagitis: Secondary | ICD-10-CM | POA: Diagnosis not present

## 2015-05-13 DIAGNOSIS — Z9989 Dependence on other enabling machines and devices: Secondary | ICD-10-CM

## 2015-05-13 DIAGNOSIS — D6489 Other specified anemias: Secondary | ICD-10-CM

## 2015-05-13 DIAGNOSIS — I10 Essential (primary) hypertension: Secondary | ICD-10-CM | POA: Diagnosis not present

## 2015-05-13 DIAGNOSIS — R7302 Impaired glucose tolerance (oral): Secondary | ICD-10-CM | POA: Diagnosis not present

## 2015-05-13 LAB — COMPREHENSIVE METABOLIC PANEL
ALT: 21 U/L (ref 6–29)
AST: 24 U/L (ref 10–35)
Albumin: 4.1 g/dL (ref 3.6–5.1)
Alkaline Phosphatase: 57 U/L (ref 33–130)
BILIRUBIN TOTAL: 0.6 mg/dL (ref 0.2–1.2)
BUN: 10 mg/dL (ref 7–25)
CO2: 29 mmol/L (ref 20–31)
Calcium: 9.1 mg/dL (ref 8.6–10.4)
Chloride: 102 mmol/L (ref 98–110)
Creat: 0.75 mg/dL (ref 0.50–0.99)
GLUCOSE: 97 mg/dL (ref 65–99)
Potassium: 3 mmol/L — ABNORMAL LOW (ref 3.5–5.3)
Sodium: 143 mmol/L (ref 135–146)
Total Protein: 6.6 g/dL (ref 6.1–8.1)

## 2015-05-13 LAB — POCT URINALYSIS DIP (MANUAL ENTRY)
BILIRUBIN UA: NEGATIVE
Bilirubin, UA: NEGATIVE
Glucose, UA: NEGATIVE
LEUKOCYTES UA: NEGATIVE
NITRITE UA: NEGATIVE
PH UA: 6
PROTEIN UA: NEGATIVE
Spec Grav, UA: >=1.03
UROBILINOGEN UA: 0.2

## 2015-05-13 LAB — CBC WITH DIFFERENTIAL/PLATELET
BASOS PCT: 0 %
Basophils Absolute: 0 cells/uL (ref 0–200)
EOS PCT: 0 %
Eosinophils Absolute: 0 cells/uL — ABNORMAL LOW (ref 15–500)
HEMATOCRIT: 40.3 % (ref 35.0–45.0)
Hemoglobin: 14 g/dL (ref 11.7–15.5)
Lymphocytes Relative: 26 %
Lymphs Abs: 3978 cells/uL — ABNORMAL HIGH (ref 850–3900)
MCH: 32.3 pg (ref 27.0–33.0)
MCHC: 34.7 g/dL (ref 32.0–36.0)
MCV: 92.9 fL (ref 80.0–100.0)
MONOS PCT: 6 %
MPV: 9.4 fL (ref 7.5–12.5)
Monocytes Absolute: 918 cells/uL (ref 200–950)
NEUTROS PCT: 68 %
Neutro Abs: 10404 cells/uL — ABNORMAL HIGH (ref 1500–7800)
PLATELETS: 261 10*3/uL (ref 140–400)
RBC: 4.34 MIL/uL (ref 3.80–5.10)
RDW: 13.4 % (ref 11.0–15.0)
WBC: 15.3 10*3/uL — AB (ref 3.8–10.8)

## 2015-05-13 LAB — HEMOGLOBIN A1C
Hgb A1c MFr Bld: 6.3 % — ABNORMAL HIGH (ref <5.7)
Mean Plasma Glucose: 134 mg/dL

## 2015-05-13 LAB — LIPID PANEL
CHOL/HDL RATIO: 1.7 ratio (ref <=5.0)
Cholesterol: 192 mg/dL (ref 125–200)
HDL: 114 mg/dL (ref >=46)
LDL CALC: 55 mg/dL (ref <130)
TRIGLYCERIDES: 115 mg/dL (ref <150)
VLDL: 23 mg/dL (ref <30)

## 2015-05-13 LAB — TSH: TSH: 1.42 mIU/L

## 2015-05-13 MED ORDER — ZOSTER VACCINE LIVE 19400 UNT/0.65ML ~~LOC~~ SUSR: 0.6500 mL | Freq: Once | SUBCUTANEOUS | Status: DC

## 2015-05-13 NOTE — Patient Instructions (Addendum)
   IF you received an x-ray today, you will receive an invoice from [facility]. Please contact [facility] at [phone] with questions or concerns regarding your invoice.   IF you received labwork today, you will receive an invoice from [facility]. Please contact [facility] at [phone] with questions or concerns regarding your invoice.   Our billing staff will not be able to assist you with questions regarding bills from these companies.  You will be contacted with the lab results as soon as they are available. The fastest way to get your results is to activate your My Chart account. Instructions are located on the last page of this paperwork. If you have not heard from us regarding the results in 2 weeks, please contact this office.    Keeping You Healthy  Get These Tests  Blood Pressure- Have your blood pressure checked by your healthcare provider at least once a year.  Normal blood pressure is 120/80.  Weight- Have your body mass index (BMI) calculated to screen for obesity.  BMI is a measure of body fat based on height and weight.  You can calculate your own BMI at www.nhlbisupport.com/bmi/  Cholesterol- Have your cholesterol checked every year.  Diabetes- Have your blood sugar checked every year if you have high blood pressure, high cholesterol, a family history of diabetes or if you are overweight.  Pap Test - Have a pap test every 1 to 5 years if you have been sexually active.  If you are older than 65 and recent pap tests have been normal you may not need additional pap tests.  In addition, if you have had a hysterectomy  for benign disease additional pap tests are not necessary.  Mammogram-Yearly mammograms are essential for early detection of breast cancer  Screening for Colon Cancer- Colonoscopy starting at age 50. Screening may begin sooner depending on your family history and other health conditions.  Follow up colonoscopy  as directed by your Gastroenterologist.  Screening for Osteoporosis- Screening begins at age 65 with bone density scanning, sooner if you are at higher risk for developing Osteoporosis.  Get these medicines  Calcium with Vitamin D- Your body requires 1200-1500 mg of Calcium a day and 800-1000 IU of Vitamin D a day.  You can only absorb 500 mg of Calcium at a time therefore Calcium must be taken in 2 or 3 separate doses throughout the day.  Hormones- Hormone therapy has been associated with increased risk for certain cancers and heart disease.  Talk to your healthcare provider about if you need relief from menopausal symptoms.  Aspirin- Ask your healthcare provider about taking Aspirin to prevent Heart Disease and Stroke.  Get these Immuniztions  Flu shot- Every fall  Pneumonia shot- Once after the age of 65; if you are younger ask your healthcare provider if you need a pneumonia shot.  Tetanus- Every ten years.  Zostavax- Once after the age of 60 to prevent shingles.  Take these steps  Don't smoke- Your healthcare provider can help you quit. For tips on how to quit, ask your healthcare provider or go to www.smokefree.gov or call 1-800 QUIT-NOW.  Be physically active- Exercise 5 days a week for a minimum of 30 minutes.  If you are not already physically active, start slow and gradually work up to 30 minutes of moderate physical activity.  Try walking, dancing, bike riding, swimming, etc.  Eat a healthy diet- Eat a variety of healthy foods such as fruits, vegetables, whole   grains, low fat milk, low fat cheeses, yogurt, lean meats, chicken, fish, eggs, dried beans, tofu, etc.  For more information go to www.thenutritionsource.org  Dental visit- Brush and floss teeth twice daily; visit your dentist twice a year.  Eye exam- Visit your Optometrist or Ophthalmologist yearly.  Drink alcohol in moderation- Limit alcohol intake to one drink or less a day.  Never drink and  drive.  Depression- Your emotional health is as important as your physical health.  If you're feeling down or losing interest in things you normally enjoy, please talk to your healthcare provider.  Seat Belts- can save your life; always wear one  Smoke/Carbon Monoxide detectors- These detectors need to be installed on the appropriate level of your home.  Replace batteries at least once a year.  Violence- If anyone is threatening or hurting you, please tell your healthcare provider.  Living Will/ Health care power of attorney- Discuss with your healthcare provider and family. 

## 2015-05-13 NOTE — Progress Notes (Signed)
Subjective:    Patient ID: Maureen Hodge, female    DOB: 1950/02/07, 65 y.o.   MRN: [id number]  05/13/2015  Annual Exam and Medication Refill   HPI This 65 y.o. female presents for Complete Physical Examination.  Last physical:  03-04-2014 Pap smear: hysterectomy. N/a.PP hemorrhage; ovaries intact.   Mammogram:  01-24-2015 Colonoscopy: 01-04-2005 [facility] Bone density: n/a TDAP:  2012 Pneumovax:   never Zostavax: never Influenza:  2016 Eye exam:  +glasses; yesterday; [place] in [place].  Dental exam:     Rash: stopped HCTZ after last visit; stopped Estrogen after last visit.  No sweating since.  Gets warm but no longer sweating.  Dr. Tamala Julian of surgery highly encouraged patient to stop.  Increased Plaquenil per dermatology; on Prednisone again.  Adding Cellcept today.  Dr. Leretha Pol of dermatology.  HTN: stopped HCTZ; added Amlodipine.  Swelling has continued to swell; by end of day swelling has worsened.  Taking Amlodipine 10mg  daily. Swelling does not bother patient.  Not taking Lasix.      NEEDS CPAP REORDER; ACTING UP; DUE FOR REPLACEMENT.     Review of Systems  Constitutional: Negative for fever, chills, diaphoresis, activity change, appetite change, fatigue and unexpected weight change.  HENT: Negative for congestion, dental problem, drooling, ear discharge, ear pain, facial swelling, hearing loss, mouth sores, nosebleeds, postnasal drip, rhinorrhea, sinus pressure, sneezing, sore throat, tinnitus, trouble swallowing and voice change.   Eyes: Negative for photophobia, pain, discharge, redness, itching and visual disturbance.  Respiratory: Negative for apnea, cough, choking, chest tightness, shortness of breath, wheezing and stridor.   Cardiovascular: Negative for chest pain, palpitations and leg swelling.  Gastrointestinal: Negative for nausea, vomiting, abdominal pain, diarrhea, constipation, blood in stool, abdominal distention, anal bleeding and rectal pain.  Endocrine:  Negative for cold intolerance, heat intolerance, polydipsia, polyphagia and polyuria.  Genitourinary: Negative for dysuria, urgency, frequency, hematuria, flank pain, decreased urine volume, vaginal bleeding, vaginal discharge, enuresis, difficulty urinating, genital sores, vaginal pain, menstrual problem, pelvic pain and dyspareunia.  Musculoskeletal: Negative for myalgias, back pain, joint swelling, arthralgias, gait problem, neck pain and neck stiffness.  Skin: Negative for color change, pallor, rash and wound.  Allergic/Immunologic: Negative for environmental allergies, food allergies and immunocompromised state.  Neurological: Negative for dizziness, tremors, seizures, syncope, facial asymmetry, speech difficulty, weakness, light-headedness, numbness and headaches.  Hematological: Negative for adenopathy. Does not bruise/bleed easily.  Psychiatric/Behavioral: Negative for suicidal ideas, hallucinations, behavioral problems, confusion, sleep disturbance, self-injury, dysphoric mood, decreased concentration and agitation. The patient is not nervous/anxious and is not hyperactive.     Past Medical History  Diagnosis Date  . Migraines   . Hypertension   . PLMD (periodic limb movement disorder)   . Diabetes mellitus     Pre-diabetes  . Brain tumor ([facility]) 05/2011    s/p resection  [facility].  . Lupus erythematosus   . Diffuse cystic mastopathy   . Other convulsions   . Intervertebral disc disorder with myelopathy, unspecified region   . Cerebral aneurysm, nonruptured   . Esophageal reflux   . Hypopotassemia 06/01/2007  . Unspecified sleep apnea     CPAP titrated to 7cm  . Allergy   . S/P bunionectomy 09/28/2013    [facility].  Right.   Past Surgical History  Procedure Laterality Date  . Dilation and curettage of uterus      x 5  . Hemorrhoid surgery    . Breast surgery      x 5 due to fibrocystic disease  .  Foot surgery    . Brain surgery  2013  . Lumbar surgery  2000  . Brain  avm repair    . Tonsillectomy and adenoidectomy    . Spine surgery    . Breast biopsy Bilateral     neg  . Breast cyst aspiration Bilateral   . Hernia repair    . Total abdominal hysterectomy  1979    postpartum hemorrhage; ovaries intact.   Allergies  Allergen Reactions  . Lisinopril Swelling    Face and lips    Social History   Social History  . Marital Status: Married    Spouse Name: N/A  . Number of Children: 2  . Years of Education: N/A   Occupational History  . Retired    Social History Main Topics  . Smoking status: Never Smoker   . Smokeless tobacco: Never Used  . Alcohol Use: No  . Drug Use: No  . Sexual Activity: Yes   Other Topics Concern  . Not on file   Social History Narrative   Marital status:  Married  X 43 years, happily , no abuse.       Children:  2 children; 4 grandchildren.      Lives: with husband.      Employment: disability.  Works part-time at [employer]; working PRN.      Tobacco: never      Alcohol: wine once per month      Drugs: none     Exercise: sporadic in 2016; walking some.     Caffeine Use: Some.    Seatbelt:  Always uses seat belts.       Smoke alarm in the home.   Exercise: Some   Family History  Problem Relation Age of Onset  . Heart disease Mother     CHF  . Heart failure Mother     CHF  . Hypertension Mother   . Stroke Mother   . Cancer Mother 49    unknown primary  . Coronary artery disease Other     family history  . Cancer Other     family history  . Hypertension Sister   . Crohn's disease Sister   . Arthritis Sister     partial knee replacement.  . Diabetes Daughter   . Breast cancer Paternal Aunt        Objective:    BP 110/90 mmHg  Pulse 69  Temp(Src) 97.7 F (36.5 C) (Oral)  Resp 16  Ht 5\' 1"  (1.549 m)  Wt 181 lb 12.8 oz (82.464 kg)  BMI 34.37 kg/m2  SpO2 97% Physical Exam  Constitutional: She is oriented to person, place, and time. She appears well-developed and well-nourished. No  distress.  HENT:  Head: Normocephalic and atraumatic.  Right Ear: External ear normal.  Left Ear: External ear normal.  Nose: Nose normal.  Mouth/Throat: Oropharynx is clear and moist.  Eyes: Conjunctivae and EOM are normal. Pupils are equal, round, and reactive to light.  Neck: Normal range of motion and full passive range of motion without pain. Neck supple. No JVD present. Carotid bruit is not present. No thyromegaly present.  Cardiovascular: Normal rate, regular rhythm and normal heart sounds.  Exam reveals no gallop and no friction rub.   No murmur heard. Pulmonary/Chest: Effort normal and breath sounds normal. She has no wheezes. She has no rales.  Abdominal: Soft. Bowel sounds are normal. She exhibits no distension and no mass. There is no tenderness. There is no rebound and  no guarding.  Musculoskeletal:       Right shoulder: Normal.       Left shoulder: Normal.       Cervical back: Normal.  Lymphadenopathy:    She has no cervical adenopathy.  Neurological: She is alert and oriented to person, place, and time. She has normal reflexes. No cranial nerve deficit. She exhibits normal muscle tone. Coordination normal.  Skin: Skin is warm and dry. No rash noted. She is not diaphoretic. No erythema. No pallor.  Psychiatric: She has a normal mood and affect. Her behavior is normal. Judgment and thought content normal.  Nursing note and vitals reviewed.  Depression screen [facility] 2/9 05/13/2015 03/31/2015 11/15/2014 07/10/2014 03/04/2014  Decreased Interest 0 0 0 0 0  Down, Depressed, Hopeless 0 0 0 0 0  PHQ - 2 Score 0 0 0 0 0   Fall Risk  05/13/2015 11/15/2014 03/04/2014 01/23/2014 12/12/2013  Falls in the past year? No No No No No  Functional Status Survey: Is the patient deaf or have difficulty hearing?: No Does the patient have difficulty seeing, even when wearing glasses/contacts?: No Does the patient have difficulty concentrating, remembering, or making decisions?: No Does the patient have  difficulty walking or climbing stairs?: No Does the patient have difficulty dressing or bathing?: No Does the patient have difficulty doing errands alone such as visiting a doctor's office or shopping?: No     Assessment & Plan:   1. Routine physical examination   2. OSA on CPAP   3. 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 Anomalies of cerebrovascular system, congenital   5. Gastroesophageal reflux disease without esophagitis   6. HYPERTENSION, BENIGN   7. Glucose intolerance (impaired glucose tolerance)   8. Screening, lipid   9. Need for shingles vaccine   10. Colon cancer screening     Orders Placed This Encounter  Procedures  . For home use only DME continuous positive airway pressure (CPAP)    Order Specific Question:  Patient has OSA or probable OSA    Answer:  Yes    Order Specific Question:  Is the patient currently using CPAP in the home    Answer:  Yes    Order Specific Question:  If yes (to question two)    Answer:  Determine DME provider and inform them of any new orders/settings    Order Specific Question:  Date of face to face encounter    Answer:  05-13-2015    Order Specific Question:  Settings    Answer:  5-10    Order Specific Question:  Signs and symptoms of probable OSA  (select all that apply)    Answer:  Witnessed apneas    Order Specific Question:  Signs and symptoms of probable OSA  (select all that apply)    Answer:  Snoring    Order Specific Question:  CPAP supplies needed    Answer:  Mask, headgear, cushions, filters, heated tubing and water chamber  . DME Other see comment    CPAP MACHINE WITH SUPPLIES/FACE MASK, ETC. REPLACEMENT  . CBC with Differential/Platelet  . Comprehensive metabolic panel    Order Specific Question:  Has the patient fasted?    Answer:  Yes  . Hemoglobin A1c  . Lipid panel    Order Specific Question:  Has the patient fasted?    Answer:  Yes  . TSH  . Ambulatory referral to Gastroenterology    Referral Priority:  Routine     Referral Type:  Consultation    Referral Reason:  Specialty Services Required    Number of Visits Requested:  1  . POCT urinalysis dipstick  . EKG 12-Lead   Meds ordered this encounter  Medications  . Zoster Vaccine Live, PF, (ZOSTAVAX) 32440 UNT/0.65ML injection    Sig: Inject 19,400 Units into the skin once.    Dispense:  0.65 mL    Refill:  0    Return in about 6 months (around 11/13/2015) for recheck high blood pressure.    Jannatul Wojdyla Elayne Guerin, M.D. [facility] [address] [place], [place]  [address] [phone] phone [phone] fax

## 2015-05-13 NOTE — Progress Notes (Signed)
   Subjective:    Patient ID: Maureen Hodge, female    DOB: 1950-12-28, 65 y.o.   MRN: [id number]  HPI    Review of Systems  Constitutional: Negative.   Eyes: Negative.   Respiratory: Negative.   Cardiovascular: Positive for leg swelling.  Gastrointestinal: Negative.   Endocrine: Positive for polyuria.  Genitourinary: Negative.   Musculoskeletal: Negative.   Skin: Positive for rash.  Allergic/Immunologic: Negative.   Neurological: Positive for headaches.  Hematological: Negative.   Psychiatric/Behavioral: Negative.        Objective:   Physical Exam        Assessment & Plan:

## 2015-05-16 ENCOUNTER — Telehealth: Payer: Self-pay

## 2015-05-16 NOTE — Telephone Encounter (Signed)
[facility] faxed request for some info pertaining to CPAP order. I will place in Dr Thompson Caul box.

## 2015-06-03 ENCOUNTER — Telehealth: Payer: Self-pay

## 2015-06-03 NOTE — Telephone Encounter (Signed)
Msg is for Dr. Tamala Julian, pt tried new meds and feet are swelling. She said that Dr. Tamala Julian wanted to know how she was doing on the meds.  Please advise

## 2015-06-03 NOTE — Telephone Encounter (Signed)
Pt is calling about Cpap machine. [facility] has sent [place] faxes but she said that they need a prescrption with pressure setting and the notes from 05/13/15.

## 2015-06-10 ENCOUNTER — Telehealth: Payer: Self-pay

## 2015-06-10 DIAGNOSIS — T887XXD Unspecified adverse effect of drug or medicament, subsequent encounter: Secondary | ICD-10-CM | POA: Diagnosis not present

## 2015-06-10 DIAGNOSIS — Z79899 Other long term (current) drug therapy: Secondary | ICD-10-CM | POA: Diagnosis not present

## 2015-06-10 NOTE — Telephone Encounter (Signed)
Dr. Tamala Julian  This Pt. Left a vm on the lab phone  she stated she called a week ago and left a message trying to tell you about her feet being swollen and she is concerned about it, she was upset that she has not heard from you and had some concerns if she should establish care with someone else. She gave a call back number of [phone]

## 2015-06-10 NOTE — Telephone Encounter (Signed)
Call patient back ---- has swelling worsened since last visit?  I am assuming so. Recommend decreasing Amlodipine to 1/2 tablet daily.  Recommend monitoring blood pressure daily on 1/2 of Amlodipine 10mg  daily.

## 2015-06-11 NOTE — Telephone Encounter (Signed)
Reached a recorded VM.  LM to CB for instructions from Dr. Tamala Julian.

## 2015-06-23 DIAGNOSIS — H52223 Regular astigmatism, bilateral: Secondary | ICD-10-CM | POA: Diagnosis not present

## 2015-06-23 DIAGNOSIS — H524 Presbyopia: Secondary | ICD-10-CM | POA: Diagnosis not present

## 2015-06-23 DIAGNOSIS — H25813 Combined forms of age-related cataract, bilateral: Secondary | ICD-10-CM | POA: Diagnosis not present

## 2015-06-23 DIAGNOSIS — H3589 Other specified retinal disorders: Secondary | ICD-10-CM | POA: Diagnosis not present

## 2015-06-23 DIAGNOSIS — Z79899 Other long term (current) drug therapy: Secondary | ICD-10-CM | POA: Diagnosis not present

## 2015-06-23 DIAGNOSIS — M3210 Systemic lupus erythematosus, organ or system involvement unspecified: Secondary | ICD-10-CM | POA: Diagnosis not present

## 2015-06-23 DIAGNOSIS — H5203 Hypermetropia, bilateral: Secondary | ICD-10-CM | POA: Diagnosis not present

## 2015-06-30 ENCOUNTER — Other Ambulatory Visit: Payer: Self-pay

## 2015-06-30 ENCOUNTER — Encounter: Payer: Self-pay | Admitting: Gynecology

## 2015-06-30 ENCOUNTER — Telehealth: Payer: Self-pay

## 2015-06-30 ENCOUNTER — Ambulatory Visit
Admission: EM | Admit: 2015-06-30 | Discharge: 2015-06-30 | Disposition: A | Discharge status: Home / Self Care | Payer: 59 | Attending: Family Medicine | Admitting: Family Medicine

## 2015-06-30 DIAGNOSIS — G4730 Sleep apnea, unspecified: Secondary | ICD-10-CM | POA: Insufficient documentation

## 2015-06-30 DIAGNOSIS — Z79899 Other long term (current) drug therapy: Secondary | ICD-10-CM | POA: Insufficient documentation

## 2015-06-30 DIAGNOSIS — K219 Gastro-esophageal reflux disease without esophagitis: Secondary | ICD-10-CM | POA: Diagnosis not present

## 2015-06-30 DIAGNOSIS — I10 Essential (primary) hypertension: Secondary | ICD-10-CM | POA: Diagnosis not present

## 2015-06-30 DIAGNOSIS — R079 Chest pain, unspecified: Secondary | ICD-10-CM

## 2015-06-30 DIAGNOSIS — R0602 Shortness of breath: Secondary | ICD-10-CM | POA: Diagnosis not present

## 2015-06-30 DIAGNOSIS — E119 Type 2 diabetes mellitus without complications: Secondary | ICD-10-CM | POA: Diagnosis not present

## 2015-06-30 DIAGNOSIS — R600 Localized edema: Secondary | ICD-10-CM | POA: Diagnosis not present

## 2015-06-30 MED ORDER — IPRATROPIUM-ALBUTEROL 0.5-2.5 (3) MG/3ML IN SOLN
3.0000 mL | Freq: Once | RESPIRATORY_TRACT | Status: AC
  Administered 2015-06-30: 3 mL via RESPIRATORY_TRACT

## 2015-06-30 NOTE — ED Provider Notes (Signed)
CSN: [id number]     Arrival date & time 06/30/15  1229 History   First MD Initiated Contact with Patient 06/30/15 1329     Chief Complaint  Patient presents with  . Shortness of Breath  . Chest Pain  . Joint Swelling   (Consider location/radiation/quality/duration/timing/severity/associated sxs/prior Treatment) HPI Comments: 65 yo female with a 5 days h/o shortness of breath when trying to swallow. Also complains of pain when swallowing liquids and solids. Denies any fevers, chills, vomiting. Also has noticed ankle swelling since starting mycophenolate. Patient also takes amlodipine and prednisone.   Patient is a 65 y.o. female presenting with shortness of breath and chest pain. The history is provided by the patient.  Shortness of Breath Associated symptoms: chest pain   Chest Pain Associated symptoms: shortness of breath     Past Medical History  Diagnosis Date  . Migraines   . Hypertension   . PLMD (periodic limb movement disorder)   . Diabetes mellitus     Pre-diabetes  . Brain tumor ([place]) 05/2011    s/p resection  [facility].  . Lupus erythematosus   . Diffuse cystic mastopathy   . Other convulsions   . Intervertebral disc disorder with myelopathy, unspecified region   . Cerebral aneurysm, nonruptured   . Esophageal reflux   . Hypopotassemia 06/01/2007  . Unspecified sleep apnea     CPAP titrated to 7cm  . Allergy   . S/P bunionectomy 09/28/2013    [facility].  Right.   Past Surgical History  Procedure Laterality Date  . Dilation and curettage of uterus      x 5  . Hemorrhoid surgery    . Breast surgery      x 5 due to fibrocystic disease  . Foot surgery    . Brain surgery  2013  . Lumbar surgery  2000  . Brain avm repair    . Tonsillectomy and adenoidectomy    . Spine surgery    . Breast biopsy Bilateral     neg  . Breast cyst aspiration Bilateral   . Hernia repair    . Total abdominal hysterectomy  1979    postpartum hemorrhage; ovaries intact.    Family History  Problem Relation Age of Onset  . Heart disease Mother     CHF  . Heart failure Mother     CHF  . Hypertension Mother   . Stroke Mother   . Cancer Mother 50    unknown primary  . Coronary artery disease Other     family history  . Cancer Other     family history  . Hypertension Sister   . Crohn's disease Sister   . Arthritis Sister     partial knee replacement.  . Diabetes Daughter   . Breast cancer Paternal Aunt    Social History  Substance Use Topics  . Smoking status: Never Smoker   . Smokeless tobacco: Never Used  . Alcohol Use: No   OB History    No data available     Review of Systems  Respiratory: Positive for shortness of breath.   Cardiovascular: Positive for chest pain.    Allergies  Lisinopril  Home Medications   Prior to Admission medications   Medication Sig Start Date End Date Taking? Authorizing Provider  albuterol (PROVENTIL HFA;VENTOLIN HFA) 108 (90 BASE) MCG/ACT inhaler Inhale 2 puffs into the lungs every 6 (six) hours as needed for wheezing or shortness of breath (cough, shortness of breath or wheezing.). 12/12/13  Yes Wardell Honour, MD  amLODipine (NORVASC) 10 MG tablet Take 1 tablet (10 mg total) by mouth daily. 03/31/15  Yes Wardell Honour, MD  atenolol (TENORMIN) 25 MG tablet TAKE 2 TABLETS BY MOUTH EVERY MORNING AND TAKE 1 TABLET EVERY EVENING 07/10/14  Yes Wardell Honour, MD  cetirizine (ZYRTEC) 10 MG tablet Take 10 mg by mouth daily.   Yes Historical Provider, MD  estradiol (ESTRACE) 2 MG tablet TAKE 1 TABLET (2 MG TOTAL) BY MOUTH DAILY. 01/09/15  Yes Wardell Honour, MD  fexofenadine (ALLEGRA) 180 MG tablet Take 180 mg by mouth daily.   Yes Historical Provider, MD  furosemide (LASIX) 40 MG tablet Take 1 tablet (40 mg total) by mouth daily. 05/21/13  Yes Wardell Honour, MD  gabapentin (NEURONTIN) 800 MG tablet TAKE 1 TABLET (800 MG TOTAL) BY MOUTH 4 (FOUR) TIMES DAILY. 07/10/14  Yes Wardell Honour, MD  HYDROcodone-acetaminophen  (NORCO/VICODIN) 5-325 MG tablet Take 1 tablet by mouth every 6 (six) hours as needed for moderate pain. 11/15/14  Yes Wardell Honour, MD  hydroxychloroquine (PLAQUENIL) 200 MG tablet Take 200 mg by mouth 2 (two) times daily.     Yes Historical Provider, MD  mometasone (ELOCON) 0.1 % ointment Apply topically Twice daily as needed. 11/09/11  Yes Historical Provider, MD  nitroGLYCERIN (NITROSTAT) 0.4 MG SL tablet Place 1 tablet (0.4 mg total) under the tongue every 5 (five) minutes as needed for chest pain. 01/23/13  Yes Wardell Honour, MD  nortriptyline (PAMELOR) 25 MG capsule Take 1 capsule (25 mg total) by mouth 3 (three) times daily. 07/10/14  Yes Wardell Honour, MD  nortriptyline (PAMELOR) 25 MG capsule TAKE ONE CAPSULE BY MOUTH 3 TIMES A DAY 05/12/15  Yes Wardell Honour, MD  pantoprazole (PROTONIX) 40 MG tablet Take 1 tablet (40 mg total) by mouth daily. 07/10/14  Yes Wardell Honour, MD  predniSONE (DELTASONE) 10 MG tablet Take 10 mg by mouth daily with breakfast.   Yes Historical Provider, MD  alendronate (FOSAMAX) 70 MG tablet Take 70 mg by mouth once a week. 06/10/15   Historical Provider, MD  Ciclopirox 1 % shampoo LATHER TO SCALP 1-2 TIMES A WEEK. ALLOW TO SIT 5 MINUTES BEFORE RINSING. 06/05/15   Historical Provider, MD  clobetasol cream (TEMOVATE) 0.05 % APPLY A THIN LAYER TO THE AFFECTED AREAS TWICE DAILY AS NEEDED FOR A MAX OF 4 WEEKS PER FLARE 06/13/15   Historical Provider, MD  hydrOXYzine (ATARAX/VISTARIL) 10 MG tablet TAKE 1-2 TABS AT DINNER AND/OR BEDTIME AS NEEDED FOR ITCHING. WATCH FOR SEDATION. 05/29/15   Historical Provider, MD  mycophenolate (CELLCEPT) 500 MG tablet  06/13/15   Historical Provider, MD  potassium chloride SA (KLOR-CON M20) 20 MEQ tablet Take 2 tablets (40 mEq total) by mouth 2 (two) times daily. Patient not taking: Reported on 05/13/2015 07/10/14   Wardell Honour, MD  triamterene-hydrochlorothiazide (MAXZIDE-25) 37.5-25 MG per tablet TAKE 1 TABLET BY MOUTH DAILY. Patient not taking:  Reported on 05/13/2015 07/10/14   Wardell Honour, MD  zoster vaccine live, PF, (ZOSTAVAX) 21308 UNT/0.65ML injection Inject 19,400 Units into the skin once. 03/04/14   Wardell Honour, MD  Zoster Vaccine Live, PF, (ZOSTAVAX) 65784 UNT/0.65ML injection Inject 19,400 Units into the skin once. 05/13/15   Wardell Honour, MD   Meds Ordered and Administered this Visit   Medications  ipratropium-albuterol (DUONEB) 0.5-2.5 (3) MG/3ML nebulizer solution 3 mL (3 mLs Nebulization Given 06/30/15 1343)  BP 120/83 mmHg  Pulse 70  Temp(Src) 97.8 F (36.6 C) (Oral)  Resp 16  Ht 5\' 2"  (1.575 m)  Wt 185 lb (83.915 kg)  BMI 33.83 kg/m2  SpO2 100% No data found.   Physical Exam  Constitutional: She is oriented to person, place, and time. She appears well-developed and well-nourished. No distress.  HENT:  Head: Normocephalic.  Nose: Nose normal.  Mouth/Throat: Oropharynx is clear and moist and mucous membranes are normal.  Eyes: Conjunctivae and EOM are normal. Pupils are equal, round, and reactive to light. Right eye exhibits no discharge. Left eye exhibits no discharge. No scleral icterus.  Neck: Normal range of motion. Neck supple. No JVD present. No tracheal deviation present. No thyromegaly present.  Cardiovascular: Normal rate, regular rhythm, normal heart sounds and intact distal pulses.   No murmur heard. Pulmonary/Chest: Effort normal and breath sounds normal. No stridor. No respiratory distress. She has no wheezes. She has no rales. She exhibits no tenderness.  Abdominal: Soft. Bowel sounds are normal. She exhibits no distension and no mass. There is no tenderness. There is no rebound and no guarding.  Musculoskeletal: She exhibits no edema or tenderness.  Lymphadenopathy:    She has no cervical adenopathy.  Neurological: She is alert and oriented to person, place, and time. She has normal reflexes.  Skin: She is not diaphoretic.  Nursing note and vitals reviewed.   ED Course  Procedures  (including critical care time)  Labs Review Labs Reviewed - No data to display  Imaging Review No results found.   Visual Acuity Review  Right Eye Distance:   Left Eye Distance:   Bilateral Distance:    Right Eye Near:   Left Eye Near:    Bilateral Near:      EKG: normal EKG, normal sinus rhythm, there are no previous tracings available for comparison; reviewed by me and agree.   MDM   1. Gastroesophageal reflux disease, esophagitis presence not specified   2. Bilateral edema of lower extremity     Discharge Medication List as of 06/30/2015  2:31 PM     1. EKG results and diagnosis reviewed with patient 2. Recommend supportive treatement 3. Follow-up prn if symptoms worsen or don't improve   Norval Gable, MD 06/30/15 1916

## 2015-06-30 NOTE — Discharge Instructions (Signed)
Edema °Edema is an abnormal buildup of fluids in your body tissues. Edema is somewhat dependent on gravity to pull the fluid to the lowest place in your body. That makes the condition more common in the legs and thighs (lower extremities). Painless swelling of the feet and ankles is common and becomes more likely as you get older. It is also common in looser tissues, like around your eyes.  °When the affected area is squeezed, the fluid may move out of that spot and leave a dent for a few moments. This dent is called pitting.  °CAUSES  °There are many possible causes of edema. Eating too much salt and being on your feet or sitting for a long time can cause edema in your legs and ankles. Hot weather may make edema worse. Common medical causes of edema include: °· Heart failure. °· Liver disease. °· Kidney disease. °· Weak blood vessels in your legs. °· Cancer. °· An injury. °· Pregnancy. °· Some medications. °· Obesity.  °SYMPTOMS  °Edema is usually painless. Your skin may look swollen or shiny.  °DIAGNOSIS  °Your health care provider may be able to diagnose edema by asking about your medical history and doing a physical exam. You may need to have tests such as X-rays, an electrocardiogram, or blood tests to check for medical conditions that may cause edema.  °TREATMENT  °Edema treatment depends on the cause. If you have heart, liver, or kidney disease, you need the treatment appropriate for these conditions. General treatment may include: °· Elevation of the affected body part above the level of your heart. °· Compression of the affected body part. Pressure from elastic bandages or support stockings squeezes the tissues and forces fluid back into the blood vessels. This keeps fluid from entering the tissues. °· Restriction of fluid and salt intake. °· Use of a water pill (diuretic). These medications are appropriate only for some types of edema. They pull fluid out of your body and make you urinate more often. This  gets rid of fluid and reduces swelling, but diuretics can have side effects. Only use diuretics as directed by your health care provider. °HOME CARE INSTRUCTIONS  °· Keep the affected body part above the level of your heart when you are lying down.   °· Do not sit still or stand for prolonged periods.   °· Do not put anything directly under your knees when lying down. °· Do not wear constricting clothing or garters on your upper legs.   °· Exercise your legs to work the fluid back into your blood vessels. This may help the swelling go down.   °· Wear elastic bandages or support stockings to reduce ankle swelling as directed by your health care provider.   °· Eat a low-salt diet to reduce fluid if your health care provider recommends it.   °· Only take medicines as directed by your health care provider.  °SEEK MEDICAL CARE IF:  °· Your edema is not responding to treatment. °· You have heart, liver, or kidney disease and notice symptoms of edema. °· You have edema in your legs that does not improve after elevating them.   °· You have sudden and unexplained weight gain. °SEEK IMMEDIATE MEDICAL CARE IF:  °· You develop shortness of breath or chest pain.   °· You cannot breathe when you lie down. °· You develop pain, redness, or warmth in the swollen areas.   °· You have heart, liver, or kidney disease and suddenly get edema. °· You have a fever and your symptoms suddenly get worse. °MAKE SURE YOU:  °·   Understand these instructions.  Will watch your condition.  Will get help right away if you are not doing well or get worse.   This information is not intended to replace advice given to you by your health care provider. Make sure you discuss any questions you have with your health care provider.   Document Released: 12/21/2004 Document Revised: 01/11/2014 Document Reviewed: 10/13/2012 Elsevier Interactive Patient Education 2016 [employer]. Gastroesophageal Reflux Disease, Adult Normally, food travels down  the esophagus and stays in the stomach to be digested. However, when a person has gastroesophageal reflux disease (GERD), food and stomach acid move back up into the esophagus. When this happens, the esophagus becomes sore and inflamed. Over time, GERD can create small holes (ulcers) in the lining of the esophagus.  CAUSES This condition is caused by a problem with the muscle between the esophagus and the stomach (lower esophageal sphincter, or LES). Normally, the LES muscle closes after food passes through the esophagus to the stomach. When the LES is weakened or abnormal, it does not close properly, and that allows food and stomach acid to go back up into the esophagus. The LES can be weakened by certain dietary substances, medicines, and medical conditions, including:  Tobacco use.  Pregnancy.  Having a hiatal hernia.  Heavy alcohol use.  Certain foods and beverages, such as coffee, chocolate, onions, and peppermint. RISK FACTORS This condition is more likely to develop in:  People who have an increased body weight.  People who have connective tissue disorders.  People who use NSAID medicines. SYMPTOMS Symptoms of this condition include:  Heartburn.  Difficult or painful swallowing.  The feeling of having a lump in the throat.  Abitter taste in the mouth.  Bad breath.  Having a large amount of saliva.  Having an upset or bloated stomach.  Belching.  Chest pain.  Shortness of breath or wheezing.  Ongoing (chronic) cough or a night-time cough.  Wearing away of tooth enamel.  Weight loss. Different conditions can cause chest pain. Make sure to see your health care provider if you experience chest pain. DIAGNOSIS Your health care provider will take a medical history and perform a physical exam. To determine if you have mild or severe GERD, your health care provider may also monitor how you respond to treatment. You may also have other tests, including:  An  endoscopy toexamine your stomach and esophagus with a small camera.  A test thatmeasures the acidity level in your esophagus.  A test thatmeasures how much pressure is on your esophagus.  A barium swallow or modified barium swallow to show the shape, size, and functioning of your esophagus. TREATMENT The goal of treatment is to help relieve your symptoms and to prevent complications. Treatment for this condition may vary depending on how severe your symptoms are. Your health care provider may recommend:  Changes to your diet.  Medicine.  Surgery. HOME CARE INSTRUCTIONS Diet  Follow a diet as recommended by your health care provider. This may involve avoiding foods and drinks such as:  Coffee and tea (with or without caffeine).  Drinks that containalcohol.  Energy drinks and sports drinks.  Carbonated drinks or sodas.  Chocolate and cocoa.  Peppermint and mint flavorings.  Garlic and onions.  Horseradish.  Spicy and acidic foods, including peppers, chili powder, curry powder, vinegar, hot sauces, and barbecue sauce.  Citrus fruit juices and citrus fruits, such as oranges, lemons, and limes.  Tomato-based foods, such as red sauce, chili, salsa, and  pizza with red sauce.  Fried and fatty foods, such as donuts, french fries, potato chips, and high-fat dressings.  High-fat meats, such as hot dogs and fatty cuts of red and white meats, such as rib eye steak, sausage, ham, and bacon.  High-fat dairy items, such as whole milk, butter, and cream cheese.  Eat small, frequent meals instead of large meals.  Avoid drinking large amounts of liquid with your meals.  Avoid eating meals during the 2-3 hours before bedtime.  Avoid lying down right after you eat.  Do not exercise right after you eat. General Instructions  Pay attention to any changes in your symptoms.  Take over-the-counter and prescription medicines only as told by your health care provider. Do not  take aspirin, ibuprofen, or other NSAIDs unless your health care provider told you to do so.  Do not use any tobacco products, including cigarettes, chewing tobacco, and e-cigarettes. If you need help quitting, ask your health care provider.  Wear loose-fitting clothing. Do not wear anything tight around your waist that causes pressure on your abdomen.  Raise (elevate) the head of your bed 6 inches (15cm).  Try to reduce your stress, such as with yoga or meditation. If you need help reducing stress, ask your health care provider.  If you are overweight, reduce your weight to an amount that is healthy for you. Ask your health care provider for guidance about a safe weight loss goal.  Keep all follow-up visits as told by your health care provider. This is important. SEEK MEDICAL CARE IF:  You have new symptoms.  You have unexplained weight loss.  You have difficulty swallowing, or it hurts to swallow.  You have wheezing or a persistent cough.  Your symptoms do not improve with treatment.  You have a hoarse voice. SEEK IMMEDIATE MEDICAL CARE IF:  You have pain in your arms, neck, jaw, teeth, or back.  You feel sweaty, dizzy, or light-headed.  You have chest pain or shortness of breath.  You vomit and your vomit looks like blood or coffee grounds.  You faint.  Your stool is bloody or black.  You cannot swallow, drink, or eat.   This information is not intended to replace advice given to you by your health care provider. Make sure you discuss any questions you have with your health care provider.   Document Released: 09/30/2004 Document Revised: 09/11/2014 Document Reviewed: 04/17/2014 Elsevier Interactive Patient Education [employer].

## 2015-06-30 NOTE — ED Notes (Signed)
Patient c/o x 5 days shortness of breath when trying to swallow / chest discomfort and bilateral ankle swelling.

## 2015-06-30 NOTE — Telephone Encounter (Signed)
Gastroenterology Pre-Procedure Review  Request Date: 10/07/15 Requesting Physician: Dr. Reginia Forts  PATIENT REVIEW QUESTIONS: The patient responded to the following health history questions as indicated:    1. Are you having any GI issues? yes (Heartburn) 2. Do you have a personal history of Polyps? no 3. Do you have a family history of Colon Cancer or Polyps? no 4. Diabetes Mellitus? no 5. Joint replacements in the past 12 months?no 6. Major health problems in the past 3 months?no 7. Any artificial heart valves, MVP, or defibrillator?no    MEDICATIONS & ALLERGIES:    Patient reports the following regarding taking any anticoagulation/antiplatelet therapy:   Plavix, Coumadin, Eliquis, Xarelto, Lovenox, Pradaxa, Brilinta, or Effient? no Aspirin? no  Patient confirms/reports the following medications:  Current Outpatient Prescriptions  Medication Sig Dispense Refill  . albuterol (PROVENTIL HFA;VENTOLIN HFA) 108 (90 BASE) MCG/ACT inhaler Inhale 2 puffs into the lungs every 6 (six) hours as needed for wheezing or shortness of breath (cough, shortness of breath or wheezing.). 1 Inhaler 0  . amLODipine (NORVASC) 10 MG tablet Take 1 tablet (10 mg total) by mouth daily. 90 tablet 1  . atenolol (TENORMIN) 25 MG tablet TAKE 2 TABLETS BY MOUTH EVERY MORNING AND TAKE 1 TABLET EVERY EVENING 270 tablet 3  . cetirizine (ZYRTEC) 10 MG tablet Take 10 mg by mouth daily.    [name] estradiol (ESTRACE) 2 MG tablet TAKE 1 TABLET (2 MG TOTAL) BY MOUTH DAILY. 90 tablet 0  . fexofenadine (ALLEGRA) 180 MG tablet Take 180 mg by mouth daily.    . furosemide (LASIX) 40 MG tablet Take 1 tablet (40 mg total) by mouth daily. 90 tablet 1  . gabapentin (NEURONTIN) 800 MG tablet TAKE 1 TABLET (800 MG TOTAL) BY MOUTH 4 (FOUR) TIMES DAILY. 360 tablet 3  . HYDROcodone-acetaminophen (NORCO/VICODIN) 5-325 MG tablet Take 1 tablet by mouth every 6 (six) hours as needed for moderate pain. 60 tablet 0  . hydroxychloroquine  (PLAQUENIL) 200 MG tablet Take 200 mg by mouth 2 (two) times daily.      . mometasone (ELOCON) 0.1 % ointment Apply topically Twice daily as needed.    . nitroGLYCERIN (NITROSTAT) 0.4 MG SL tablet Place 1 tablet (0.4 mg total) under the tongue every 5 (five) minutes as needed for chest pain. 30 tablet 0  . nortriptyline (PAMELOR) 25 MG capsule Take 1 capsule (25 mg total) by mouth 3 (three) times daily. 90 capsule 3  . nortriptyline (PAMELOR) 25 MG capsule TAKE ONE CAPSULE BY MOUTH 3 TIMES A DAY 90 capsule 3  . pantoprazole (PROTONIX) 40 MG tablet Take 1 tablet (40 mg total) by mouth daily. 90 tablet 3  . potassium chloride SA (KLOR-CON M20) 20 MEQ tablet Take 2 tablets (40 mEq total) by mouth 2 (two) times daily. (Patient not taking: Reported on 05/13/2015) 360 tablet 3  . predniSONE (DELTASONE) 10 MG tablet Take 10 mg by mouth daily with breakfast.    . triamterene-hydrochlorothiazide (MAXZIDE-25) 37.5-25 MG per tablet TAKE 1 TABLET BY MOUTH DAILY. (Patient not taking: Reported on 05/13/2015) 90 tablet 3  . zoster vaccine live, PF, (ZOSTAVAX) 60454 UNT/0.65ML injection Inject 19,400 Units into the skin once. 0.65 mL 0  . Zoster Vaccine Live, PF, (ZOSTAVAX) 09811 UNT/0.65ML injection Inject 19,400 Units into the skin once. 0.65 mL 0   No current facility-administered medications for this visit.    Patient confirms/reports the following allergies:  Allergies  Allergen Reactions  . Lisinopril Swelling    Face and lips  No orders of the defined types were placed in this encounter.    AUTHORIZATION INFORMATION Primary Insurance: 1D#: Group #:  Secondary Insurance: 1D#: Group #:  SCHEDULE INFORMATION: Date: 10/07/15 Time: Location: [place]

## 2015-07-01 ENCOUNTER — Other Ambulatory Visit: Payer: Self-pay

## 2015-07-10 DIAGNOSIS — Z79899 Other long term (current) drug therapy: Secondary | ICD-10-CM | POA: Diagnosis not present

## 2015-07-10 DIAGNOSIS — T887XXD Unspecified adverse effect of drug or medicament, subsequent encounter: Secondary | ICD-10-CM | POA: Diagnosis not present

## 2015-07-10 DIAGNOSIS — R600 Localized edema: Secondary | ICD-10-CM | POA: Diagnosis not present

## 2015-07-22 ENCOUNTER — Ambulatory Visit (INDEPENDENT_AMBULATORY_CARE_PROVIDER_SITE_OTHER): Payer: 59 | Admitting: Family Medicine

## 2015-07-22 VITALS — BP 110/86 | HR 78 | Temp 97.8°F | Resp 18 | Ht 61.0 in | Wt 181.2 lb

## 2015-07-22 DIAGNOSIS — R0600 Dyspnea, unspecified: Secondary | ICD-10-CM

## 2015-07-22 DIAGNOSIS — Z79899 Other long term (current) drug therapy: Secondary | ICD-10-CM

## 2015-07-22 DIAGNOSIS — R600 Localized edema: Secondary | ICD-10-CM

## 2015-07-22 DIAGNOSIS — R0609 Other forms of dyspnea: Secondary | ICD-10-CM

## 2015-07-22 DIAGNOSIS — I10 Essential (primary) hypertension: Secondary | ICD-10-CM

## 2015-07-22 DIAGNOSIS — E876 Hypokalemia: Secondary | ICD-10-CM | POA: Diagnosis not present

## 2015-07-22 LAB — CBC
HCT: 37 % (ref 35.0–45.0)
Hemoglobin: 12.5 g/dL (ref 11.7–15.5)
MCH: 31.6 pg (ref 27.0–33.0)
MCHC: 33.8 g/dL (ref 32.0–36.0)
MCV: 93.7 fL (ref 80.0–100.0)
MPV: 8.6 fL (ref 7.5–12.5)
PLATELETS: 277 10*3/uL (ref 140–400)
RBC: 3.95 MIL/uL (ref 3.80–5.10)
RDW: 15.1 % — AB (ref 11.0–15.0)
WBC: 8.2 10*3/uL (ref 3.8–10.8)

## 2015-07-22 MED ORDER — TRIAMTERENE-HCTZ 37.5-25 MG PO TABS: ORAL_TABLET | ORAL | Status: DC

## 2015-07-22 NOTE — Progress Notes (Signed)
By signing my name below, I, Mesha Guinyard, attest that this documentation has been prepared under the direction and in the presence of Arlyss Queen, MD.  Electronically Signed: Verlee Monte, Medical Scribe. 07/22/2015. 11:35 AM.  Subjective:    Patient ID: Maureen Hodge, female    DOB: 1950-09-16, 65 y.o.   MRN: [id number]  HPI Chief Complaint  Patient presents with  . Leg Swelling    x few months and got worse    HPI Comments: Maureen Hodge is a 65 y.o. female with a PMHx of benign HTN, who presents to the [facility] complaining of leg swelling. Pt of Dr. Tamala Julian. Last physical May 2017. Hx of multiple medical problems including HTN, lupus, anemia, and obesity. Hx of chronic edema but here worsening of swelling. She had been on HCTZ for HTN but this was stopped due to possible rash from that medication in March- the rash didn't change with stopping the HCTZ. She was started on Amlodipine initially 5 mg QD; she was on 10 mg of Amlodipine on May 9th visit. Pt had a nl TSH in May.  Pt reports increasing bilateral leg swelling, L>R, left lower leg below the knee, onset 3-4 months. Pt states she can't put pressure on her left foot. Pt reports SOB onset 1-2 months that's noticeable when she's walking. Pt has been elevating her legs frequently for relief. Pt is on chronic Prednisone but is decreasing the dose- now on 5 mg daily. Pt was on estrogen, but not currently. Pt reports lower back pain. Pt has had 2 back surgeries in the past. Pt was never told she had heart failure. Pt denies chest pain with SOB. Pt denies recent airplane travel, and PMHx of blood clots.  Hypokalemia: Noted for low potassium of 3.0. She had been off of K+ at last visit when reading was 3.0. Has now been on K for the past week. No recent doses of Lasixs.  Patient Active Problem List   Diagnosis Date Noted  . 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 OSA on  CPAP 07/10/2014  . Need for Zostavax administration 01/23/2013  . 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 Lumbar canal stenosis 08/22/2012  . Routine general medical examination at a health care facility 01/24/2012  . Routine gynecological examination 01/24/2012  . Anemia 01/24/2012  . Pain of left lower leg 01/24/2012  . Hypokalemia 10/25/2011  . GERD (gastroesophageal reflux disease) 10/25/2011  . Meningioma ([place]) 10/25/2011  . Low oxygen saturation 08/17/2011  . Benign neoplasm of cerebral meninges ([place]) 06/01/2011  . Adiposity 06/01/2011  . Cephalalgia 04/11/2011  . EDEMA 05/21/2009  . HYPERTENSION, BENIGN 03/20/2009  . SLEEP RELATED MOVEMENT DISORDER UNSPECIFIED 03/20/2009  . CHEST PAIN-UNSPECIFIED 03/20/2009  . PRE-DIABETES 03/20/2009  . 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 Hemangioma of intracranial structure ([place]) 06/22/2001   Past Medical History  Diagnosis Date  . Migraines   . Hypertension   . PLMD (periodic limb movement disorder)   . Diabetes mellitus     Pre-diabetes  . Brain tumor ([place]) 05/2011    s/p resection  [facility].  . Lupus erythematosus   . Diffuse cystic mastopathy   . Other convulsions   . Intervertebral disc disorder with myelopathy, unspecified region   . Cerebral aneurysm, nonruptured   . Esophageal reflux   . Hypopotassemia 06/01/2007  . Unspecified sleep apnea     CPAP  titrated to 7cm  . Allergy   . S/P bunionectomy 09/28/2013    [place].  Right.   Past Surgical History  Procedure Laterality Date  . Dilation and curettage of uterus      x 5  . Hemorrhoid surgery    . Breast surgery      x 5 due to fibrocystic disease  . Foot surgery    . Brain surgery  2013  . Lumbar surgery  2000  . Brain avm repair    . Tonsillectomy and adenoidectomy    . Spine surgery    . Breast biopsy Bilateral     neg  . Breast cyst aspiration Bilateral   . Hernia repair    . Total  abdominal hysterectomy  1979    postpartum hemorrhage; ovaries intact.   Allergies  Allergen Reactions  . Lisinopril Swelling    Hodge and lips   Prior to Admission medications   Medication Sig Start Date End Date Taking? Authorizing Provider  albuterol (PROVENTIL HFA;VENTOLIN HFA) 108 (90 BASE) MCG/ACT inhaler Inhale 2 puffs into the lungs every 6 (six) hours as needed for wheezing or shortness of breath (cough, shortness of breath or wheezing.). 12/12/13  Yes Wardell Honour, MD  alendronate (FOSAMAX) 70 MG tablet Take 70 mg by mouth once a week. 06/10/15  Yes Historical Provider, MD  amLODipine (NORVASC) 10 MG tablet Take 1 tablet (10 mg total) by mouth daily. 03/31/15  Yes Wardell Honour, MD  atenolol (TENORMIN) 25 MG tablet TAKE 2 TABLETS BY MOUTH EVERY MORNING AND TAKE 1 TABLET EVERY EVENING 07/10/14  Yes Wardell Honour, MD  cetirizine (ZYRTEC) 10 MG tablet Take 10 mg by mouth daily.   Yes Historical Provider, MD  Ciclopirox 1 % shampoo LATHER TO SCALP 1-2 TIMES A WEEK. ALLOW TO SIT 5 MINUTES BEFORE RINSING. 06/05/15  Yes Historical Provider, MD  clobetasol cream (TEMOVATE) 0.05 % APPLY A THIN LAYER TO THE AFFECTED AREAS TWICE DAILY AS NEEDED FOR A MAX OF 4 WEEKS PER FLARE 06/13/15  Yes Historical Provider, MD  fexofenadine (ALLEGRA) 180 MG tablet Take 180 mg by mouth daily.   Yes Historical Provider, MD  gabapentin (NEURONTIN) 800 MG tablet TAKE 1 TABLET (800 MG TOTAL) BY MOUTH 4 (FOUR) TIMES DAILY. 07/10/14  Yes Wardell Honour, MD  HYDROcodone-acetaminophen (NORCO/VICODIN) 5-325 MG tablet Take 1 tablet by mouth every 6 (six) hours as needed for moderate pain. 11/15/14  Yes Wardell Honour, MD  hydroxychloroquine (PLAQUENIL) 200 MG tablet Take 200 mg by mouth 2 (two) times daily.     Yes Historical Provider, MD  hydrOXYzine (ATARAX/VISTARIL) 10 MG tablet TAKE 1-2 TABS AT DINNER AND/OR BEDTIME AS NEEDED FOR ITCHING. WATCH FOR SEDATION. 05/29/15  Yes Historical Provider, MD  mometasone (ELOCON) 0.1 %  ointment Apply topically Twice daily as needed. 11/09/11  Yes Historical Provider, MD  mycophenolate (CELLCEPT) 500 MG tablet  06/13/15  Yes Historical Provider, MD  nortriptyline (PAMELOR) 25 MG capsule Take 1 capsule (25 mg total) by mouth 3 (three) times daily. 07/10/14  Yes Wardell Honour, MD  pantoprazole (PROTONIX) 40 MG tablet Take 1 tablet (40 mg total) by mouth daily. 07/10/14  Yes Wardell Honour, MD  potassium chloride SA (KLOR-CON M20) 20 MEQ tablet Take 2 tablets (40 mEq total) by mouth 2 (two) times daily. 07/10/14  Yes Wardell Honour, MD  predniSONE (DELTASONE) 10 MG tablet Take 10 mg by mouth daily with breakfast.   Yes Historical Provider,  MD  triamterene-hydrochlorothiazide (MAXZIDE-25) 37.5-25 MG per tablet TAKE 1 TABLET BY MOUTH DAILY. 07/10/14  Yes Wardell Honour, MD  zoster vaccine live, PF, (ZOSTAVAX) 13086 UNT/0.65ML injection Inject 19,400 Units into the skin once. 03/04/14  Yes Wardell Honour, MD  estradiol (ESTRACE) 2 MG tablet TAKE 1 TABLET (2 MG TOTAL) BY MOUTH DAILY. Patient not taking: Reported on 07/22/2015 01/09/15   Wardell Honour, MD  furosemide (LASIX) 40 MG tablet Take 1 tablet (40 mg total) by mouth daily. Patient not taking: Reported on 07/22/2015 05/21/13   Wardell Honour, MD  nitroGLYCERIN (NITROSTAT) 0.4 MG SL tablet Place 1 tablet (0.4 mg total) under the tongue every 5 (five) minutes as needed for chest pain. Patient not taking: Reported on 07/22/2015 01/23/13   Wardell Honour, MD   Social History   Social History  . Marital Status: Married    Spouse Name: N/A  . Number of Children: 2  . Years of Education: N/A   Occupational History  . Retired    Social History Main Topics  . Smoking status: Never Smoker   . Smokeless tobacco: Never Used  . Alcohol Use: No  . Drug Use: No  . Sexual Activity: Yes   Other Topics Concern  . Not on file   Social History Narrative   Marital status:  Married  X 43 years, happily , no abuse.       Children:  2 children; 4  grandchildren.      Lives: with husband.      Employment: disability.  Works part-time at [employer]; working PRN.      Tobacco: never      Alcohol: wine once per month      Drugs: none     Exercise: sporadic in 2016; walking some.     Caffeine Use: Some.    Seatbelt:  Always uses seat belts.       Smoke alarm in the home.   Exercise: Some   Review of Systems  Respiratory: Positive for shortness of breath.   Cardiovascular: Positive for leg swelling. Negative for chest pain.  Musculoskeletal: Positive for back pain.   Objective:  BP 110/86 mmHg  Pulse 78  Temp(Src) 97.8 F (36.6 C) (Oral)  Resp 18  Ht 5\' 1"  (1.549 m)  Wt 181 lb 3.2 oz (82.192 kg)  BMI 34.26 kg/m2  SpO2 98%  Wt Readings from Last 3 Encounters:  07/22/15 181 lb 3.2 oz (82.192 kg)  06/30/15 185 lb (83.915 kg)  05/13/15 181 lb 12.8 oz (82.464 kg)   Physical Exam  Constitutional: She is oriented to person, place, and time. She appears well-developed and well-nourished.  HENT:  Head: Normocephalic and atraumatic.  Eyes: Conjunctivae and EOM are normal. Pupils are equal, round, and reactive to light.  Neck: Carotid bruit is not present.  Cardiovascular: Normal rate, regular rhythm, normal heart sounds and intact distal pulses.   Pulmonary/Chest: Effort normal and breath sounds normal.  Abdominal: Soft. She exhibits no pulsatile midline mass. There is no tenderness.  Musculoskeletal: She exhibits edema.  2-3+ pitting edema bilateral lower extremities. Calves are non tender Edema into the feet NVI distally Negative Honans  Neurological: She is alert and oriented to person, place, and time.  Skin: Skin is warm and dry.  Psychiatric: She has a normal mood and affect. Her behavior is normal.  Vitals reviewed.  EKG reading: Sinus rhythm No acute findings or apparent changes  Assessment & Plan:   Rip Harbour  Maureen Hodge is a 65 y.o. female Bilateral edema of lower extremity - Plan: Basic metabolic panel, Brain  natriuretic peptide, CBC  - Likely multifactorial with her chronic medications, heat of summer, and amlodipine. As no definitive cause of HCTZ causing rash, will stop amlodipine, restart triamterene HCTZ, orthostatic/hypertensive precautions discussed. Recheck in 1 week.   Dyspnea on exertion - Plan: Brain natriuretic peptide, EKG 12-Lead, CBC  - Possibly due to deconditioning, no concerning findings on EKG. Will check BMP, but unlikely CHF. RTC precautions. If persistent, consider cardiology eval.  Essential hypertension - Plan: Basic metabolic panel, triamterene-hydrochlorothiazide (MAXZIDE-25) 37.5-25 MG tablet  - As above, will change amlodipine to triamterene HCTZ. Monitor outside readings, and orthostatic hypotensive precautions.  Hypokalemia - Plan: Basic metabolic panel  -Now back on potassium. Will check baseline level today, possibly recheck in 1 week as restarting triamterene HCTZ.  High risk medication use   Meds ordered this encounter  Medications  . triamterene-hydrochlorothiazide (MAXZIDE-25) 37.5-25 MG tablet    Sig: TAKE 1 TABLET BY MOUTH DAILY.    Dispense:  30 tablet    Refill:  3   Patient Instructions        IF you received an x-ray today, you will receive an invoice from [facility]. Please contact [facility] at [phone] with questions or concerns regarding your invoice.   IF you received labwork today, you will receive an invoice from [employer]. Please contact [facility] at [phone] with questions or concerns regarding your invoice.   Our billing staff will not be able to assist you with questions regarding bills from these companies.  You will be contacted with the lab results as soon as they are available. The fastest way to get your results is to activate your My Chart account. Instructions are located on the last page of this paperwork. If you have not heard from [place] regarding the results in 2 weeks,  please contact this office.     Stop amlodipine. Restart triamterene/HCTZ one full pill per day. Elevate legs when seated. If rash returns or worsens, would stop this medication again and look into other options. I will also check a heart failure test and a blood count to make sure that these are not contributing to your swelling, as well as your other electrolytes like potassium. The swelling should improve with stopping the amlodipine as well as restarting your previous blood pressure medication. Follow up with myself or Dr. Tamala Julian in the next 1 week - sooner if worse or any calf pain.   Depending on current potassium  level, we may need to stop the potassium supplement as you are restarting a blood pressure medicine that can keep that level up.  If shortness of breath with exertion persists, or any chest pain or tightness, proceed directly to the emergency room or return here right away.  If your previous back pain or hip pain persists, follow-up with Dr. Tamala Julian or other provider if needed.   Return to the clinic or go to the nearest emergency room if any of your symptoms worsen or new symptoms occur.  Peripheral Edema You have swelling in your legs (peripheral edema). This swelling is due to excess accumulation of salt and water in your body. Edema may be a sign of heart, kidney or liver disease, or a side effect of a medication. It may also be due to problems in the leg veins. Elevating your legs and using special support stockings may be very helpful, if the  cause of the swelling is due to poor venous circulation. Avoid long periods of standing, whatever the cause. Treatment of edema depends on identifying the cause. Chips, pretzels, pickles and other salty foods should be avoided. Restricting salt in your diet is almost always needed. Water pills (diuretics) are often used to remove the excess salt and water from your body via urine. These medicines prevent the kidney from reabsorbing sodium.  This increases urine flow. Diuretic treatment may also result in lowering of potassium levels in your body. Potassium supplements may be needed if you have to use diuretics daily. Daily weights can help you keep track of your progress in clearing your edema. You should call your caregiver for follow up care as recommended. SEEK IMMEDIATE MEDICAL CARE IF:   You have increased swelling, pain, redness, or heat in your legs.  You develop shortness of breath, especially when lying down.  You develop chest or abdominal pain, weakness, or fainting.  You have a fever.   This information is not intended to replace advice given to you by your health care provider. Make sure you discuss any questions you have with your health care provider.   Document Released: 01/29/2004 Document Revised: 03/15/2011 Document Reviewed: 07/03/2014 Elsevier Interactive Patient Education 2016 [place] of Breath Shortness of breath means you have trouble breathing. It could also mean that you have a medical problem. You should get immediate medical care for shortness of breath. CAUSES   Not enough oxygen in the air such as with high altitudes or a smoke-filled room.  Certain lung diseases, infections, or problems.  Heart disease or conditions, such as angina or heart failure.  Low red blood cells (anemia).  Poor physical fitness, which can cause shortness of breath when you exercise.  Chest or back injuries or stiffness.  Being overweight.  Smoking.  Anxiety, which can make you feel like you are not getting enough air. DIAGNOSIS  Serious medical problems can often be found during your physical exam. Tests may also be done to determine why you are having shortness of breath. Tests may include:  Chest X-rays.  Lung function tests.  Blood tests.  An electrocardiogram (ECG).  An ambulatory electrocardiogram. An ambulatory ECG records your heartbeat patterns over a 24-hour  period.  Exercise testing.  A transthoracic echocardiogram (TTE). During echocardiography, sound waves are used to evaluate how blood flows through your heart.  A transesophageal echocardiogram (TEE).  Imaging scans. Your health care provider may not be able to find a cause for your shortness of breath after your exam. In this case, it is important to have a follow-up exam with your health care provider as directed.  TREATMENT  Treatment for shortness of breath depends on the cause of your symptoms and can vary greatly. HOME CARE INSTRUCTIONS   Do not smoke. Smoking is a common cause of shortness of breath. If you smoke, ask for help to quit.  Avoid being around chemicals or things that may bother your breathing, such as paint fumes and dust.  Rest as needed. Slowly resume your usual activities.  If medicines were prescribed, take them as directed for the full length of time directed. This includes oxygen and any inhaled medicines.  Keep all follow-up appointments as directed by your health care provider. SEEK MEDICAL CARE IF:   Your condition does not improve in the time expected.  You have a hard time doing your normal activities even with rest.  You have any new symptoms. SEEK IMMEDIATE  MEDICAL CARE IF:   Your shortness of breath gets worse.  You feel light-headed, faint, or develop a cough not controlled with medicines.  You start coughing up blood.  You have pain with breathing.  You have chest pain or pain in your arms, shoulders, or abdomen.  You have a fever.  You are unable to walk up stairs or exercise the way you normally do. MAKE SURE YOU:  Understand these instructions.  Will watch your condition.  Will get help right away if you are not doing well or get worse.   This information is not intended to replace advice given to you by your health care provider. Make sure you discuss any questions you have with your health care provider.   Document  Released: 09/15/2000 Document Revised: 12/26/2012 Document Reviewed: 03/08/2011 Elsevier Interactive Patient Education [employer].      I personally performed the services described in this documentation, which was scribed in my presence. The recorded information has been reviewed and considered, and addended by me as needed.   Signed,   Merri Ray, MD [facility].  07/23/2015 11:58 AM

## 2015-07-22 NOTE — Patient Instructions (Addendum)
IF you received an x-ray today, you will receive an invoice from [facility]. Please contact [facility] at [phone] with questions or concerns regarding your invoice.   IF you received labwork today, you will receive an invoice from [employer]. Please contact [facility] at [phone] with questions or concerns regarding your invoice.   Our billing staff will not be able to assist you with questions regarding bills from these companies.  You will be contacted with the lab results as soon as they are available. The fastest way to get your results is to activate your My Chart account. Instructions are located on the last page of this paperwork. If you have not heard from [place] regarding the results in 2 weeks, please contact this office.     Stop amlodipine. Restart triamterene/HCTZ one full pill per day. Elevate legs when seated. If rash returns or worsens, would stop this medication again and look into other options. I will also check a heart failure test and a blood count to make sure that these are not contributing to your swelling, as well as your other electrolytes like potassium. The swelling should improve with stopping the amlodipine as well as restarting your previous blood pressure medication. Follow up with myself or Dr. Tamala Julian in the next 1 week - sooner if worse or any calf pain.   Depending on current potassium  level, we may need to stop the potassium supplement as you are restarting a blood pressure medicine that can keep that level up.  If shortness of breath with exertion persists, or any chest pain or tightness, proceed directly to the emergency room or return here right away.  If your previous back pain or hip pain persists, follow-up with Dr. Tamala Julian or other provider if needed.   Return to the clinic or go to the nearest emergency room if any of your symptoms worsen or new symptoms occur.  Peripheral Edema You have swelling  in your legs (peripheral edema). This swelling is due to excess accumulation of salt and water in your body. Edema may be a sign of heart, kidney or liver disease, or a side effect of a medication. It may also be due to problems in the leg veins. Elevating your legs and using special support stockings may be very helpful, if the cause of the swelling is due to poor venous circulation. Avoid long periods of standing, whatever the cause. Treatment of edema depends on identifying the cause. Chips, pretzels, pickles and other salty foods should be avoided. Restricting salt in your diet is almost always needed. Water pills (diuretics) are often used to remove the excess salt and water from your body via urine. These medicines prevent the kidney from reabsorbing sodium. This increases urine flow. Diuretic treatment may also result in lowering of potassium levels in your body. Potassium supplements may be needed if you have to use diuretics daily. Daily weights can help you keep track of your progress in clearing your edema. You should call your caregiver for follow up care as recommended. SEEK IMMEDIATE MEDICAL CARE IF:   You have increased swelling, pain, redness, or heat in your legs.  You develop shortness of breath, especially when lying down.  You develop chest or abdominal pain, weakness, or fainting.  You have a fever.   This information is not intended to replace advice given to you by your health care provider. Make sure you discuss any questions you have with your health care provider.  Document Released: 01/29/2004 Document Revised: 03/15/2011 Document Reviewed: 07/03/2014 Elsevier Interactive Patient Education 2016 [place] of Breath Shortness of breath means you have trouble breathing. It could also mean that you have a medical problem. You should get immediate medical care for shortness of breath. CAUSES   Not enough oxygen in the air such as with high altitudes or a  smoke-filled room.  Certain lung diseases, infections, or problems.  Heart disease or conditions, such as angina or heart failure.  Low red blood cells (anemia).  Poor physical fitness, which can cause shortness of breath when you exercise.  Chest or back injuries or stiffness.  Being overweight.  Smoking.  Anxiety, which can make you feel like you are not getting enough air. DIAGNOSIS  Serious medical problems can often be found during your physical exam. Tests may also be done to determine why you are having shortness of breath. Tests may include:  Chest X-rays.  Lung function tests.  Blood tests.  An electrocardiogram (ECG).  An ambulatory electrocardiogram. An ambulatory ECG records your heartbeat patterns over a 24-hour period.  Exercise testing.  A transthoracic echocardiogram (TTE). During echocardiography, sound waves are used to evaluate how blood flows through your heart.  A transesophageal echocardiogram (TEE).  Imaging scans. Your health care provider may not be able to find a cause for your shortness of breath after your exam. In this case, it is important to have a follow-up exam with your health care provider as directed.  TREATMENT  Treatment for shortness of breath depends on the cause of your symptoms and can vary greatly. HOME CARE INSTRUCTIONS   Do not smoke. Smoking is a common cause of shortness of breath. If you smoke, ask for help to quit.  Avoid being around chemicals or things that may bother your breathing, such as paint fumes and dust.  Rest as needed. Slowly resume your usual activities.  If medicines were prescribed, take them as directed for the full length of time directed. This includes oxygen and any inhaled medicines.  Keep all follow-up appointments as directed by your health care provider. SEEK MEDICAL CARE IF:   Your condition does not improve in the time expected.  You have a hard time doing your normal activities even with  rest.  You have any new symptoms. SEEK IMMEDIATE MEDICAL CARE IF:   Your shortness of breath gets worse.  You feel light-headed, faint, or develop a cough not controlled with medicines.  You start coughing up blood.  You have pain with breathing.  You have chest pain or pain in your arms, shoulders, or abdomen.  You have a fever.  You are unable to walk up stairs or exercise the way you normally do. MAKE SURE YOU:  Understand these instructions.  Will watch your condition.  Will get help right away if you are not doing well or get worse.   This information is not intended to replace advice given to you by your health care provider. Make sure you discuss any questions you have with your health care provider.   Document Released: 09/15/2000 Document Revised: 12/26/2012 Document Reviewed: 03/08/2011 Elsevier Interactive Patient Education [employer].

## 2015-07-23 LAB — BASIC METABOLIC PANEL
BUN: 7 mg/dL (ref 7–25)
CHLORIDE: 105 mmol/L (ref 98–110)
CO2: 28 mmol/L (ref 20–31)
Calcium: 8.9 mg/dL (ref 8.6–10.4)
Creat: 0.64 mg/dL (ref 0.50–0.99)
Glucose, Bld: 101 mg/dL — ABNORMAL HIGH (ref 65–99)
POTASSIUM: 3 mmol/L — AB (ref 3.5–5.3)
SODIUM: 145 mmol/L (ref 135–146)

## 2015-07-23 LAB — BRAIN NATRIURETIC PEPTIDE: BRAIN NATRIURETIC PEPTIDE: 15.4 pg/mL (ref <100)

## 2015-07-29 ENCOUNTER — Telehealth: Payer: Self-pay | Admitting: Emergency Medicine

## 2015-07-29 DIAGNOSIS — R079 Chest pain, unspecified: Secondary | ICD-10-CM | POA: Diagnosis not present

## 2015-07-29 DIAGNOSIS — R918 Other nonspecific abnormal finding of lung field: Secondary | ICD-10-CM | POA: Diagnosis not present

## 2015-07-29 DIAGNOSIS — M79651 Pain in right thigh: Secondary | ICD-10-CM | POA: Diagnosis not present

## 2015-07-29 DIAGNOSIS — M7989 Other specified soft tissue disorders: Secondary | ICD-10-CM | POA: Diagnosis not present

## 2015-07-29 DIAGNOSIS — R0602 Shortness of breath: Secondary | ICD-10-CM | POA: Diagnosis not present

## 2015-07-29 DIAGNOSIS — M79662 Pain in left lower leg: Secondary | ICD-10-CM | POA: Diagnosis not present

## 2015-07-29 DIAGNOSIS — R0600 Dyspnea, unspecified: Secondary | ICD-10-CM | POA: Diagnosis not present

## 2015-07-29 DIAGNOSIS — G8929 Other chronic pain: Secondary | ICD-10-CM | POA: Diagnosis not present

## 2015-07-29 DIAGNOSIS — R931 Abnormal findings on diagnostic imaging of heart and coronary circulation: Secondary | ICD-10-CM | POA: Diagnosis not present

## 2015-07-29 DIAGNOSIS — M79606 Pain in leg, unspecified: Secondary | ICD-10-CM | POA: Diagnosis not present

## 2015-07-29 DIAGNOSIS — I517 Cardiomegaly: Secondary | ICD-10-CM | POA: Diagnosis not present

## 2015-07-29 DIAGNOSIS — M25572 Pain in left ankle and joints of left foot: Secondary | ICD-10-CM | POA: Diagnosis not present

## 2015-07-29 DIAGNOSIS — M79605 Pain in left leg: Secondary | ICD-10-CM | POA: Diagnosis not present

## 2015-07-29 NOTE — Telephone Encounter (Signed)
-----   Message from Wendie Agreste, MD sent at 07/29/2015  9:17 AM EDT ----- Call patient. Blood counts overall okay, tests for heart failure was negative or normal. Potassium was low again. Planned to recheck this at 1 week follow-up, so can return at any point to see me this week to recheck her blood pressure and repeat potassium level.

## 2015-07-30 DIAGNOSIS — M79651 Pain in right thigh: Secondary | ICD-10-CM | POA: Diagnosis not present

## 2015-07-30 DIAGNOSIS — M25572 Pain in left ankle and joints of left foot: Secondary | ICD-10-CM | POA: Diagnosis not present

## 2015-07-30 DIAGNOSIS — G8929 Other chronic pain: Secondary | ICD-10-CM | POA: Diagnosis not present

## 2015-07-30 DIAGNOSIS — R0600 Dyspnea, unspecified: Secondary | ICD-10-CM | POA: Diagnosis not present

## 2015-07-30 DIAGNOSIS — M7989 Other specified soft tissue disorders: Secondary | ICD-10-CM | POA: Diagnosis not present

## 2015-07-30 DIAGNOSIS — M79662 Pain in left lower leg: Secondary | ICD-10-CM | POA: Diagnosis not present

## 2015-08-05 ENCOUNTER — Ambulatory Visit (INDEPENDENT_AMBULATORY_CARE_PROVIDER_SITE_OTHER): Payer: 59 | Admitting: Family Medicine

## 2015-08-05 ENCOUNTER — Encounter: Payer: Self-pay | Admitting: Family Medicine

## 2015-08-05 VITALS — BP 110/80 | HR 86 | Temp 97.9°F | Resp 16 | Ht 61.0 in | Wt 180.0 lb

## 2015-08-05 DIAGNOSIS — B029 Zoster without complications: Secondary | ICD-10-CM | POA: Diagnosis not present

## 2015-08-05 DIAGNOSIS — L03311 Cellulitis of abdominal wall: Secondary | ICD-10-CM | POA: Diagnosis not present

## 2015-08-05 DIAGNOSIS — M5410 Radiculopathy, site unspecified: Secondary | ICD-10-CM | POA: Diagnosis not present

## 2015-08-05 DIAGNOSIS — E876 Hypokalemia: Secondary | ICD-10-CM

## 2015-08-05 LAB — COMPREHENSIVE METABOLIC PANEL
ALBUMIN: 3.8 g/dL (ref 3.6–5.1)
ALT: 10 U/L (ref 6–29)
AST: 16 U/L (ref 10–35)
Alkaline Phosphatase: 65 U/L (ref 33–130)
BILIRUBIN TOTAL: 0.5 mg/dL (ref 0.2–1.2)
BUN: 8 mg/dL (ref 7–25)
CALCIUM: 9 mg/dL (ref 8.6–10.4)
CO2: 21 mmol/L (ref 20–31)
Chloride: 105 mmol/L (ref 98–110)
Creat: 0.68 mg/dL (ref 0.50–0.99)
Glucose, Bld: 112 mg/dL — ABNORMAL HIGH (ref 65–99)
POTASSIUM: 4 mmol/L (ref 3.5–5.3)
Sodium: 140 mmol/L (ref 135–146)
Total Protein: 6.6 g/dL (ref 6.1–8.1)

## 2015-08-05 LAB — MAGNESIUM: Magnesium: 1.8 mg/dL (ref 1.5–2.5)

## 2015-08-05 MED ORDER — DOXYCYCLINE HYCLATE 100 MG PO CAPS: 100.0000 mg | ORAL_CAPSULE | Freq: Two times a day (BID) | ORAL | 0 refills | Status: DC

## 2015-08-05 MED ORDER — VALACYCLOVIR HCL 1 G PO TABS: 1000.0000 mg | ORAL_TABLET | Freq: Three times a day (TID) | ORAL | 0 refills | Status: DC

## 2015-08-05 MED ORDER — LIDOCAINE 5 % EX PTCH: 1.0000 | MEDICATED_PATCH | CUTANEOUS | 3 refills | Status: DC

## 2015-08-05 MED ORDER — HYDROCODONE-ACETAMINOPHEN 5-325 MG PO TABS: 1.0000 | ORAL_TABLET | Freq: Four times a day (QID) | ORAL | 0 refills | Status: DC | PRN

## 2015-08-05 NOTE — Patient Instructions (Addendum)
IF you received an x-ray today, you will receive an invoice from [facility]. Please contact [facility] at [phone] with questions or concerns regarding your invoice.   IF you received labwork today, you will receive an invoice from [employer]. Please contact [facility] at [phone] with questions or concerns regarding your invoice.   Our billing staff will not be able to assist you with questions regarding bills from these companies.  You will be contacted with the lab results as soon as they are available. The fastest way to get your results is to activate your My Chart account. Instructions are located on the last page of this paperwork. If you have not heard from [place] regarding the results in 2 weeks, please contact this office.     Shingles Shingles, which is also known as herpes zoster, is an infection that causes a painful skin rash and fluid-filled blisters. Shingles is not related to genital herpes, which is a sexually transmitted infection.   Shingles only develops in people who:  Have had chickenpox.  Have received the chickenpox vaccine. (This is rare.) CAUSES Shingles is caused by varicella-zoster virus (VZV). This is the same virus that causes chickenpox. After exposure to VZV, the virus stays in the body in an inactive (dormant) state. Shingles develops if the virus reactivates. This can happen many years after the initial exposure to VZV. It is not known what causes this virus to reactivate. RISK FACTORS People who have had chickenpox or received the chickenpox vaccine are at risk for shingles. Infection is more common in people who:  Are older than age 22.  Have a weakened defense (immune) system, such as those with HIV, AIDS, or cancer.  Are taking medicines that weaken the immune system, such as transplant medicines.  Are under great stress. SYMPTOMS Early symptoms of this condition include itching, tingling,  and pain in an area on your skin. Pain may be described as burning, stabbing, or throbbing. A few days or weeks after symptoms start, a painful red rash appears, usually on one side of the body in a bandlike or beltlike pattern. The rash eventually turns into fluid-filled blisters that break open, scab over, and dry up in about 2-3 weeks. At any time during the infection, you may also develop:  A fever.  Chills.  A headache.  An upset stomach. DIAGNOSIS This condition is diagnosed with a skin exam. Sometimes, skin or fluid samples are taken from the blisters before a diagnosis is made. These samples are examined under a microscope or sent to a lab for testing. TREATMENT There is no specific cure for this condition. Your health care provider will probably prescribe medicines to help you manage pain, recover more quickly, and avoid long-term problems. Medicines may include:  Antiviral drugs.  Anti-inflammatory drugs.  Pain medicines. If the area involved is on your face, you may be referred to a specialist, such as an eye doctor (ophthalmologist) or an ear, nose, and throat (ENT) doctor to help you avoid eye problems, chronic pain, or disability. HOME CARE INSTRUCTIONS Medicines  Take medicines only as directed by your health care provider.  Apply an anti-itch or numbing cream to the affected area as directed by your health care provider. Blister and Rash Care  Take a cool bath or apply cool compresses to the area of the rash or blisters as directed by your health care provider. This may help with pain and itching.  Keep your rash covered with  a loose bandage (dressing). Wear loose-fitting clothing to help ease the pain of material rubbing against the rash.  Keep your rash and blisters clean with mild soap and cool water or as directed by your health care provider.  Check your rash every day for signs of infection. These include redness, swelling, and pain that lasts or  increases.  Do not pick your blisters.  Do not scratch your rash. General Instructions  Rest as directed by your health care provider.  Keep all follow-up visits as directed by your health care provider. This is important.  Until your blisters scab over, your infection can cause chickenpox in people who have never had it or been vaccinated against it. To prevent this from happening, avoid contact with other people, especially:  Babies.  Pregnant women.  Children who have eczema.  Elderly people who have transplants.  People who have chronic illnesses, such as leukemia or AIDS. SEEK MEDICAL CARE IF:  Your pain is not relieved with prescribed medicines.  Your pain does not get better after the rash heals.  Your rash looks infected. Signs of infection include redness, swelling, and pain that lasts or increases. SEEK IMMEDIATE MEDICAL CARE IF:  The rash is on your face or nose.  You have facial pain, pain around your eye area, or loss of feeling on one side of your face.  You have ear pain or you have ringing in your ear.  You have loss of taste.  Your condition gets worse.   This information is not intended to replace advice given to you by your health care provider. Make sure you discuss any questions you have with your health care provider.   Document Released: 12/21/2004 Document Revised: 01/11/2014 Document Reviewed: 11/01/2013 Elsevier Interactive Patient Education [employer].

## 2015-08-05 NOTE — Progress Notes (Signed)
Subjective:    Patient ID: Maureen Hodge, female    DOB: [birth year], 65 y.o.   MRN: [id number]  08/05/2015  Other (left leg hurts)   HPI This 65 y.o. female presents for [place].  LEFT lateral lower leg pain: presented to ED on 07/29/15.   [place] the beach with [place].  Onset four months ago intermittent.  Recurred after back surgery; [place] [facility] NS at [facility].  Can get records from [facility].  S/p evaluation by ortho on 02/2015.  S/p MRI 11/2014 L5-S1 severe facet degenerative changes with grade 1 anterolisthesis.  Severe L foraminal stenosis and mild to moderate right foraminal stenosis. S/p doppler LEFT leg; negative for DVT.    S/p CXR that revealed cardiomegaly with possible widened mediastinum; recommend repeat CXR in 3 months; reticular lung opacities B which represent bronchitis or edema; no cough.  Was SOB at visit; now resolved; no cough.  S/p EKG.  DOE with presentation; was very active.  S/p injections in back; s/p two injections; last injection at [facility] 12/2014.  Dr. Trenton Gammon operated on lower back two times prior; requesting paperwork by [facility].  Offered appointment on 08/13/15; hoping to get in this week with Dr. Trenton Gammon.  Last lower surgery 5 years ago.    Acute stress reaction:  In laws suffered with horrible bed bugs that infested the house.  Had to burn all furniture and washed all clothing.  Had to clean   Hypokalemia: due for repeat potassium level.  Low with visit with Dr. Carlota Raspberry.  Taking KCL two daily.  Rash:  Review of Systems  Constitutional: Negative for chills, diaphoresis, fatigue and fever.  Eyes: Negative for visual disturbance.  Respiratory: Negative for cough and shortness of breath.   Cardiovascular: Negative for chest pain, palpitations and leg swelling.  Gastrointestinal: Negative for abdominal pain, constipation, diarrhea, nausea and vomiting.  Endocrine: Negative for cold intolerance, heat intolerance, polydipsia, polyphagia and polyuria.    Musculoskeletal: Positive for back pain.  Skin: Positive for rash.  Neurological: Negative for dizziness, tremors, seizures, syncope, facial asymmetry, speech difficulty, weakness, light-headedness, numbness and headaches.    Past Medical History:  Diagnosis Date  . Allergy   . Brain tumor ([place]) 05/2011   s/p resection  [facility].  [name] Cerebral aneurysm, nonruptured   . Diabetes mellitus    Pre-diabetes  . Diffuse cystic mastopathy   . Esophageal reflux   . Hypertension   . Hypopotassemia 06/01/2007  . Intervertebral disc disorder with myelopathy, unspecified region   . Lupus erythematosus   . Migraines   . Other convulsions   . PLMD (periodic limb movement disorder)   . S/P bunionectomy 09/28/2013   [place].  Right.  [name] Unspecified sleep apnea    CPAP titrated to 7cm   Past Surgical History:  Procedure Laterality Date  . BRAIN AVM REPAIR    . BRAIN SURGERY  2013  . BREAST BIOPSY Bilateral    neg  . BREAST CYST ASPIRATION Bilateral   . BREAST SURGERY     x 5 due to fibrocystic disease  . DILATION AND CURETTAGE OF UTERUS     x 5  . FOOT SURGERY    . HEMORRHOID SURGERY    . HERNIA REPAIR    . lumbar surgery  2000  . SPINE SURGERY    . TONSILLECTOMY AND ADENOIDECTOMY    . TOTAL ABDOMINAL HYSTERECTOMY  1979   postpartum hemorrhage; ovaries intact.   Allergies  Allergen Reactions  .  Lisinopril Swelling    Face and lips    Social History   Social History  . Marital status: Married    Spouse name: N/A  . Number of children: 2  . Years of education: N/A   Occupational History  . Retired    Social History Main Topics  . Smoking status: Never Smoker  . Smokeless tobacco: Never Used  . Alcohol use No  . Drug use: No  . Sexual activity: Yes   Other Topics Concern  . Not on file   Social History Narrative   Marital status:  Married  X 43 years, happily , no abuse.       Children:  2 children; 4 grandchildren.      Lives: with husband.       Employment: disability.  Works part-time at [employer]; working PRN.      Tobacco: never      Alcohol: wine once per month      Drugs: none     Exercise: sporadic in 2016; walking some.     Caffeine Use: Some.    Seatbelt:  Always uses seat belts.       Smoke alarm in the home.   Exercise: Some   Family History  Problem Relation Age of Onset  . Heart disease Mother     CHF  . Heart failure Mother     CHF  . Hypertension Mother   . Stroke Mother   . Cancer Mother 97    unknown primary  . Hypertension Sister   . Crohn's disease Sister   . Arthritis Sister     partial knee replacement.  . Diabetes Daughter   . Coronary artery disease Other     family history  . Cancer Other     family history  . Breast cancer Paternal Aunt        Objective:    BP 110/80 (BP Location: Left Arm, Patient Position: Sitting, Cuff Size: Normal)   Pulse 86   Temp 97.9 F (36.6 C) (Oral)   Resp 16   Ht 5\' 1"  (1.549 m)   Wt 180 lb (81.6 kg)   SpO2 98%   BMI 34.01 kg/m  Physical Exam  Constitutional: She is oriented to person, place, and time. She appears well-developed and well-nourished. No distress.  HENT:  Head: Normocephalic and atraumatic.  Right Ear: External ear normal.  Left Ear: External ear normal.  Nose: Nose normal.  Mouth/Throat: Oropharynx is clear and moist.  Eyes: Conjunctivae and EOM are normal. Pupils are equal, round, and reactive to light.  Neck: Normal range of motion. Neck supple. Carotid bruit is not present. No thyromegaly present.  Cardiovascular: Normal rate, regular rhythm, normal heart sounds and intact distal pulses.  Exam reveals no gallop and no friction rub.   No murmur heard. Pulmonary/Chest: Effort normal and breath sounds normal. She has no wheezes. She has no rales.  Abdominal: Soft. Bowel sounds are normal. She exhibits no distension and no mass. There is no tenderness. There is no rebound and no guarding.    Musculoskeletal:       Right  shoulder: She exhibits decreased range of motion. She exhibits no tenderness, no bony tenderness, no pain, no spasm, normal pulse and normal strength.  Lymphadenopathy:    She has no cervical adenopathy.  Neurological: She is alert and oriented to person, place, and time. No cranial nerve deficit.  Skin: Skin is warm and dry. No rash noted. She is not  diaphoretic. No erythema. No pallor.     Scattered vesicular rash R lower back; cluster of vesicles along R lower abdomen; cluster of healing vesicles R anterior thigh.    Diffuse area of induration, erythema, tenderness R lower abdominal wall.  No fluctuance.  Area of erythema and induration 3cm x 2 cm.  Psychiatric: She has a normal mood and affect. Her behavior is normal.   Results for orders placed or performed in visit on 08/05/15  Comprehensive metabolic panel  Result Value Ref Range   Sodium 140 135 - 146 mmol/L   Potassium 4.0 3.5 - 5.3 mmol/L   Chloride 105 98 - 110 mmol/L   CO2 21 20 - 31 mmol/L   Glucose, Bld 112 (H) 65 - 99 mg/dL   BUN 8 7 - 25 mg/dL   Creat 0.68 0.50 - 0.99 mg/dL   Total Bilirubin 0.5 0.2 - 1.2 mg/dL   Alkaline Phosphatase 65 33 - 130 U/L   AST 16 10 - 35 U/L   ALT 10 6 - 29 U/L   Total Protein 6.6 6.1 - 8.1 g/dL   Albumin 3.8 3.6 - 5.1 g/dL   Calcium 9.0 8.6 - 10.4 mg/dL  Magnesium  Result Value Ref Range   Magnesium 1.8 1.5 - 2.5 mg/dL       Assessment & Plan:   1. Herpes zoster   2. Cellulitis of abdominal wall   3. Acute low back pain with radicular symptoms, duration less than 6 weeks   4. Hypokalemia    -New. Herpes zoster is etiology to L radicular pain.  Rx for Valtrex provided; will avoid Prednisone due to recent prolonged therapy with prednisone. -has new secondary cellulitis of lower abdomen on LEFT; rx for Doxycycline; RTC immediately for fever, increasing redness or swelling or pain.  -obtain labs. rx for hydrocodone provided; continue Neurontin for radicular pain; rx for lidoderm  patches.   Orders Placed This Encounter  Procedures  . Comprehensive metabolic panel  . Magnesium   Meds ordered this encounter  Medications  . valACYclovir (VALTREX) 1000 MG tablet    Sig: Take 1 tablet (1,000 mg total) by mouth 3 (three) times daily.    Dispense:  21 tablet    Refill:  0  . DISCONTD: HYDROcodone-acetaminophen (NORCO/VICODIN) 5-325 MG tablet    Sig: Take 1-2 tablets by mouth every 6 (six) hours as needed for moderate pain.    Dispense:  60 tablet    Refill:  0  . DISCONTD: doxycycline (VIBRAMYCIN) 100 MG capsule    Sig: Take 1 capsule (100 mg total) by mouth 2 (two) times daily.    Dispense:  20 capsule    Refill:  0  . DISCONTD: lidocaine (LIDODERM) 5 %    Sig: Place 1 patch onto the skin daily. Remove & Discard patch within 12 hours or as directed by MD    Dispense:  30 patch    Refill:  3    No Follow-up on file.   Amogh Komatsu Elayne Guerin, M.D. [facility] [address] [place], [place]  [phone] phone [phone] fax

## 2015-08-06 ENCOUNTER — Telehealth: Payer: Self-pay

## 2015-08-06 NOTE — Telephone Encounter (Signed)
Pt is calling to let dr Tamala Julian know that she believes that she did develop a fever today and also that she was hoping to get something to cover the shingles for pain   [facility] [phone]

## 2015-08-06 NOTE — Telephone Encounter (Incomplete)
PA approved,for lidocaine patches being used for pain from shingles, through 08/05/16. Notified pharm.

## 2015-08-07 NOTE — Telephone Encounter (Signed)
Pt states that today she has not had a fever at all. Yesterday her fever was 101-102 She is taking 1hydrocodone every 6 hours and it takes some of the pain away. When I suggested taking 2 every 6hours pt stated that she tried on Wednesday and it upset her stomach so she is weary about taking 2 again. Pt is wondering if she needs to RTC. Please advise.

## 2015-08-07 NOTE — Telephone Encounter (Signed)
Call --- 1.  How high has her fever been?  When was her last fever?  If she has had fever greater than 101.0, she needs to be reevaluated in the office TODAY.  I am not working today or tomorrow, but she will need to see someone.  2.  How is her pain?  Is she taking the hydrocodone 1-2 every 6 hours for pain?  Is it not controlling her pain?

## 2015-08-08 MED ORDER — ONDANSETRON 8 MG PO TBDP: 8.0000 mg | ORAL_TABLET | Freq: Three times a day (TID) | ORAL | 0 refills | Status: DC | PRN

## 2015-08-08 NOTE — Telephone Encounter (Signed)
1.  If she is NOT having any further fever, she does NOT need to return for reevaluation.  2.  If one hydrocodone is not controlling her pain, I would recommend increasing to 2 hydrocodone every six hours.  Is she having nausea?  If yes, I can send in Zofran for nausea.

## 2015-08-11 ENCOUNTER — Telehealth: Payer: Self-pay

## 2015-08-11 NOTE — Telephone Encounter (Signed)
Patient stated that she sees Dr. Tamala Julian and she treats her for Shingles, well she has broke back out and she is running a high fever and wants to know what she needs to do. I told her that if she was running a high fever then she needs to come in and be seen so she is going to try and get an appointment with Dr.Smith on Wednesday of this week but she still wanted me to put a message in for her.

## 2015-08-11 NOTE — Telephone Encounter (Signed)
Spoke with pt and she states she picked Zofran up on Friday.

## 2015-08-11 NOTE — Telephone Encounter (Signed)
Spoke with pt to clarify, she states she would like to wait until Wednesday because she does not want to see anyone but Dr. Tamala Julian

## 2015-08-13 ENCOUNTER — Ambulatory Visit (INDEPENDENT_AMBULATORY_CARE_PROVIDER_SITE_OTHER): Payer: 59 | Admitting: Family Medicine

## 2015-08-13 VITALS — BP 128/82 | HR 88 | Temp 97.8°F | Resp 17 | Ht 61.0 in | Wt 176.0 lb

## 2015-08-13 DIAGNOSIS — R7302 Impaired glucose tolerance (oral): Secondary | ICD-10-CM

## 2015-08-13 DIAGNOSIS — B029 Zoster without complications: Secondary | ICD-10-CM

## 2015-08-13 DIAGNOSIS — L02211 Cutaneous abscess of abdominal wall: Secondary | ICD-10-CM | POA: Diagnosis not present

## 2015-08-13 LAB — GLUCOSE, POCT (MANUAL RESULT ENTRY): POC Glucose: 95 mg/dl (ref 70–99)

## 2015-08-13 LAB — POCT CBC
Granulocyte percent: 74.3 %G (ref 37–80)
HCT, POC: 34 % — AB (ref 37.7–47.9)
Hemoglobin: 11.6 g/dL — AB (ref 12.2–16.2)
Lymph, poc: 3.4 (ref 0.6–3.4)
MCH, POC: 32.8 pg — AB (ref 27–31.2)
MCHC: 34.3 g/dL (ref 31.8–35.4)
MCV: 95.7 fL (ref 80–97)
MID (CBC): 0.1 (ref 0–0.9)
MPV: 6.4 fL (ref 0–99.8)
PLATELET COUNT, POC: 391 10*3/uL (ref 142–424)
POC Granulocyte: 10.3 — AB (ref 2–6.9)
POC LYMPH %: 24.7 % (ref 10–50)
POC MID %: 1 % (ref 0–12)
RBC: 3.56 M/uL — AB (ref 4.04–5.48)
RDW, POC: 13.9 %
WBC: 13.9 10*3/uL — AB (ref 4.6–10.2)

## 2015-08-13 MED ORDER — CIPROFLOXACIN HCL 500 MG PO TABS: 500.0000 mg | ORAL_TABLET | Freq: Two times a day (BID) | ORAL | 0 refills | Status: DC

## 2015-08-13 MED ORDER — CEFTRIAXONE SODIUM 1 G IJ SOLR
1.0000 g | Freq: Once | INTRAMUSCULAR | Status: AC
  Administered 2015-08-13: 1 g via INTRAMUSCULAR

## 2015-08-13 MED ORDER — HYDROCODONE-ACETAMINOPHEN 5-325 MG PO TABS: 1.0000 | ORAL_TABLET | Freq: Four times a day (QID) | ORAL | 0 refills | Status: DC | PRN

## 2015-08-13 NOTE — Patient Instructions (Addendum)
IF you received an x-ray today, you will receive an invoice from [facility]. Please contact [facility] at [phone] with questions or concerns regarding your invoice.   IF you received labwork today, you will receive an invoice from [employer]. Please contact [facility] at [phone] with questions or concerns regarding your invoice.   Our billing staff will not be able to assist you with questions regarding bills from these companies.  You will be contacted with the lab results as soon as they are available. The fastest way to get your results is to activate your My Chart account. Instructions are located on the last page of this paperwork. If you have not heard from [place] regarding the results in 2 weeks, please contact this office.     Abscess An abscess is an infected area that contains a collection of pus and debris.It can occur in almost any part of the body. An abscess is also known as a furuncle or boil. CAUSES  An abscess occurs when tissue gets infected. This can occur from blockage of oil or sweat glands, infection of hair follicles, or a minor injury to the skin. As the body tries to fight the infection, pus collects in the area and creates pressure under the skin. This pressure causes pain. People with weakened immune systems have difficulty fighting infections and get certain abscesses more often.  SYMPTOMS Usually an abscess develops on the skin and becomes a painful mass that is red, warm, and tender. If the abscess forms under the skin, you may feel a moveable soft area under the skin. Some abscesses break open (rupture) on their own, but most will continue to get worse without care. The infection can spread deeper into the body and eventually into the bloodstream, causing you to feel ill.  DIAGNOSIS  Your caregiver will take your medical history and perform a physical exam. A sample of fluid may also be taken from the  abscess to determine what is causing your infection. TREATMENT  Your caregiver may prescribe antibiotic medicines to fight the infection. However, taking antibiotics alone usually does not cure an abscess. Your caregiver may need to make a small cut (incision) in the abscess to drain the pus. In some cases, gauze is packed into the abscess to reduce pain and to continue draining the area. HOME CARE INSTRUCTIONS   Only take over-the-counter or prescription medicines for pain, discomfort, or fever as directed by your caregiver.  If you were prescribed antibiotics, take them as directed. Finish them even if you start to feel better.  If gauze is used, follow your caregiver's directions for changing the gauze.  To avoid spreading the infection:  Keep your draining abscess covered with a bandage.  Wash your hands well.  Do not share personal care items, towels, or whirlpools with others.  Avoid skin contact with others.  Keep your skin and clothes clean around the abscess.  Keep all follow-up appointments as directed by your caregiver. SEEK MEDICAL CARE IF:   You have increased pain, swelling, redness, fluid drainage, or bleeding.  You have muscle aches, chills, or a general ill feeling.  You have a fever. MAKE SURE YOU:   Understand these instructions.  Will watch your condition.  Will get help right away if you are not doing well or get worse.   This information is not intended to replace advice given to you by your health care provider. Make sure you discuss any questions you  have with your health care provider.   Document Released: 09/30/2004 Document Revised: 06/22/2011 Document Reviewed: 03/05/2011 Elsevier Interactive Patient Education [employer].

## 2015-08-13 NOTE — Progress Notes (Signed)
Patient ID: Maureen Hodge, female   DOB: 1950-07-01, 65 y.o.   MRN: [id number]   Subjective:  By signing my name below, I, Moises Blood, attest that this documentation has been prepared under the direction and in the presence of Reginia Forts, MD. Electronically Signed: Moises Blood, [place]. 08/12/2015 , 3:05 PM .  Patient was seen in Room 1 .   Patient ID: Maureen Hodge, female    DOB: [birth year], 65 y.o.   MRN: [id number]  08/13/2015  Follow-up (shingles)  HPI Patient is here for follow up of shingles rash. She was seen about 8 days ago for follow up of left lateral lower leg pain. But at that time, she also mentioned having a rash over her left flank. She was given doxycycline and valacyclovir for shingles and cellulitis, as well as hydrocodone and lidoderm patches for pain. Here for follow up.   Patient states she hasn't been able to lay down and the area has been draining. She informs the medications have been working well. She also notes some drainage in the area. She notes the hydrocodone initially made her sick but is doing better with it now; she takes it every 6 hours. Patient called 2 days ago reporting a fever tmax 101 but she denies running a fever since.   Review of Systems  Constitutional: Negative for chills, diaphoresis, fatigue and fever.  Respiratory: Negative for cough, shortness of breath and wheezing.   Gastrointestinal: Negative for abdominal pain, diarrhea, nausea and vomiting.  Genitourinary: Positive for flank pain.  Musculoskeletal: Positive for back pain and myalgias.  Skin: Positive for color change and rash. Negative for pallor and wound.  Neurological: Positive for numbness. Negative for weakness.    Past Medical History:  Diagnosis Date  . Allergy   . Brain tumor ([place]) 05/2011   s/p resection  [facility].  [name] Cerebral aneurysm, nonruptured   . Diabetes mellitus    Pre-diabetes  . Diffuse cystic mastopathy   . Esophageal reflux   .  Hypertension   . Hypopotassemia 06/01/2007  . Intervertebral disc disorder with myelopathy, unspecified region   . Lupus erythematosus   . Migraines   . Other convulsions   . PLMD (periodic limb movement disorder)   . S/P bunionectomy 09/28/2013   [facility].  Right.  [name] Unspecified sleep apnea    CPAP titrated to 7cm   Past Surgical History:  Procedure Laterality Date  . BRAIN AVM REPAIR    . BRAIN SURGERY  2013  . BREAST BIOPSY Bilateral    neg  . BREAST CYST ASPIRATION Bilateral   . BREAST SURGERY     x 5 due to fibrocystic disease  . DILATION AND CURETTAGE OF UTERUS     x 5  . FOOT SURGERY    . HEMORRHOID SURGERY    . HERNIA REPAIR    . lumbar surgery  2000  . SPINE SURGERY    . TONSILLECTOMY AND ADENOIDECTOMY    . TOTAL ABDOMINAL HYSTERECTOMY  1979   postpartum hemorrhage; ovaries intact.   Allergies  Allergen Reactions  . Lisinopril Swelling    Face and lips    Social History   Social History  . Marital status: Married    Spouse name: N/A  . Number of children: 2  . Years of education: N/A   Occupational History  . Retired    Social History Main Topics  . Smoking status: Never Smoker  . Smokeless tobacco: Never Used  . Alcohol use  No  . Drug use: No  . Sexual activity: Yes   Other Topics Concern  . Not on file   Social History Narrative   Marital status:  Married  X 43 years, happily , no abuse.       Children:  2 children; 4 grandchildren.      Lives: with husband.      Employment: disability.  Works part-time at [employer]; working PRN.      Tobacco: never      Alcohol: wine once per month      Drugs: none     Exercise: sporadic in 2016; walking some.     Caffeine Use: Some.    Seatbelt:  Always uses seat belts.       Smoke alarm in the home.   Exercise: Some   Family History  Problem Relation Age of Onset  . Heart disease Mother     CHF  . Heart failure Mother     CHF  . Hypertension Mother   . Stroke Mother   . Cancer Mother 81     unknown primary  . Hypertension Sister   . Crohn's disease Sister   . Arthritis Sister     partial knee replacement.  . Diabetes Daughter   . Coronary artery disease Other     family history  . Cancer Other     family history  . Breast cancer Paternal Aunt        Objective:    BP 128/82 (BP Location: Right Arm, Patient Position: Sitting, Cuff Size: Large)   Pulse 88   Temp 97.8 F (36.6 C) (Oral)   Resp 17   Ht 5\' 1"  (1.549 m)   Wt 176 lb (79.8 kg)   SpO2 98%   BMI 33.25 kg/m   Physical Exam  Constitutional: She is oriented to person, place, and time. She appears well-developed and well-nourished. No distress.  HENT:  Head: Normocephalic and atraumatic.  Eyes: Conjunctivae and EOM are normal. Pupils are equal, round, and reactive to light.  Neck: Normal range of motion. Neck supple.  Cardiovascular: Normal rate, regular rhythm and normal heart sounds.  Exam reveals no gallop and no friction rub.   No murmur heard. Pulmonary/Chest: Effort normal and breath sounds normal. No respiratory distress. She has no wheezes. She has no rales.  Abdominal: Soft. Bowel sounds are normal. She exhibits no distension and no mass. There is no tenderness. There is no rebound and no guarding.    Musculoskeletal: Normal range of motion.  Neurological: She is alert and oriented to person, place, and time.  Skin: Skin is warm and dry. She is not diaphoretic.     well healing vesicular rash on the left lumbar region extending to left hip, and into medial proximal thigh; left suprapubic region has a large area of erythema and induration, 10x6 cm with fluctuance, tenderness and erythema, residual drainage on the skin superficially, extends into the labia majora, lidoderm patches along distal lateral leg.  Psychiatric: She has a normal mood and affect. Her behavior is normal.  Nursing note and vitals reviewed.       Assessment & Plan:   1. Abscess of abdominal wall   2. Herpes zoster   3.  Glucose intolerance (impaired glucose tolerance)    -New abscess large of L lower abdominal wall s/p I&D in office with packing. -s/p Rocephin injection in office; rx for Cipro provided during visit; wound culture obtained. -RTC 48 hours for recheck. -warm  compressed to be applied bid. -obtain labs. -continue hydrocodone and lidoderm patches for pain. -shingles rash healing nicely; cellulitis likely due to secondary bacterial infection of shingles rash/vesicles; pt recently on prolonged prednisone therapy for rash.   Orders Placed This Encounter  Procedures  . WOUND CULTURE    Order Specific Question:   Source    Answer:   abdomen  . Comprehensive metabolic panel  . POCT CBC  . POCT glucose (manual entry)   Meds ordered this encounter  Medications  . cefTRIAXone (ROCEPHIN) injection 1 g  . DISCONTD: HYDROcodone-acetaminophen (NORCO/VICODIN) 5-325 MG tablet    Sig: Take 1-2 tablets by mouth every 6 (six) hours as needed for moderate pain.    Dispense:  60 tablet    Refill:  0  . DISCONTD: ciprofloxacin (CIPRO) 500 MG tablet    Sig: Take 1 tablet (500 mg total) by mouth 2 (two) times daily.    Dispense:  20 tablet    Refill:  0    Return in about 2 days (around 08/15/2015) for recheck.    I personally performed the services described in this documentation, which was scribed in my presence. The recorded information has been reviewed and considered.  Quantina Dershem Elayne Guerin, M.D. [facility] [address] [place],   [address] [phone] phone [phone] fax

## 2015-08-14 ENCOUNTER — Telehealth: Payer: Self-pay | Admitting: Gastroenterology

## 2015-08-14 LAB — COMPREHENSIVE METABOLIC PANEL
ALBUMIN: 3.6 g/dL (ref 3.6–5.1)
ALK PHOS: 66 U/L (ref 33–130)
ALT: 9 U/L (ref 6–29)
AST: 17 U/L (ref 10–35)
BILIRUBIN TOTAL: 0.4 mg/dL (ref 0.2–1.2)
BUN: 14 mg/dL (ref 7–25)
CALCIUM: 9.9 mg/dL (ref 8.6–10.4)
CO2: 25 mmol/L (ref 20–31)
CREATININE: 0.74 mg/dL (ref 0.50–0.99)
Chloride: 103 mmol/L (ref 98–110)
Glucose, Bld: 89 mg/dL (ref 65–99)
Potassium: 4.4 mmol/L (ref 3.5–5.3)
Sodium: 140 mmol/L (ref 135–146)
Total Protein: 6.4 g/dL (ref 6.1–8.1)

## 2015-08-14 NOTE — Telephone Encounter (Signed)
Patient has shingles and will call back to reschedule when she is feeling better. I called [facility] to let them know.

## 2015-08-15 ENCOUNTER — Ambulatory Visit (INDEPENDENT_AMBULATORY_CARE_PROVIDER_SITE_OTHER): Payer: 59 | Admitting: Family Medicine

## 2015-08-15 VITALS — BP 108/68 | HR 77 | Temp 97.5°F | Resp 18 | Ht 61.0 in | Wt 174.4 lb

## 2015-08-15 DIAGNOSIS — L03319 Cellulitis of trunk, unspecified: Secondary | ICD-10-CM | POA: Diagnosis not present

## 2015-08-15 DIAGNOSIS — L02219 Cutaneous abscess of trunk, unspecified: Secondary | ICD-10-CM | POA: Diagnosis not present

## 2015-08-15 MED ORDER — CEFTRIAXONE SODIUM 1 G IJ SOLR
1.0000 g | Freq: Once | INTRAMUSCULAR | Status: AC
  Administered 2015-08-15: 1 g via INTRAMUSCULAR

## 2015-08-15 NOTE — Progress Notes (Signed)
Patient ID: COLEMAN EVERROAD, female   DOB: 07-May-1950, 65 y.o.   MRN: [id number]   Subjective:  By signing my name below, I, Moises Blood, attest that this documentation has been prepared under the direction and in the presence of Reginia Forts, MD. Electronically Signed: Moises Blood, [place]. 09/01/2015 , 1:16 PM .  Patient was seen in Room 10 .   Patient ID: DONNIELLE YECK, female    DOB: [birth year], 65 y.o.   MRN: [id number]  08/15/2015  Follow-up (wound check, located on left side of stomach )   HPI WESLEE ELLERY is a 65 y.o. female who presents to [facility] for 48 hour follow up on wound check for L lower abdominal wall abscess/cellulitis. She was seen 2 days ago for shingles rash and cellulitis with abscess follow up. She was given doxycycline and valacyclovir initially for shingles and cellulitis. Today, she is doing well. Denies fever/chills/sweats.  Daughter is changing bandage twice daily; moderate amount of drainage expressed from wound.   Review of Systems  Constitutional: Negative for chills, diaphoresis, fatigue and fever.  Musculoskeletal: Positive for back pain and myalgias.  Skin: Positive for color change, rash and wound.    Past Medical History:  Diagnosis Date  . Allergy   . Brain tumor ([place]) 05/2011   s/p resection  [facility].  [name] Cerebral aneurysm, nonruptured   . Diabetes mellitus    Pre-diabetes  . Diffuse cystic mastopathy   . Esophageal reflux   . Hypertension   . Hypopotassemia 06/01/2007  . Intervertebral disc disorder with myelopathy, unspecified region   . Lupus erythematosus   . Migraines   . Other convulsions   . PLMD (periodic limb movement disorder)   . S/P bunionectomy 09/28/2013   [facility].  Right.  [name] Unspecified sleep apnea    CPAP titrated to 7cm   Past Surgical History:  Procedure Laterality Date  . BRAIN AVM REPAIR    . BRAIN SURGERY  2013  . BREAST BIOPSY Bilateral    neg  . BREAST CYST ASPIRATION Bilateral   . BREAST  SURGERY     x 5 due to fibrocystic disease  . DILATION AND CURETTAGE OF UTERUS     x 5  . FOOT SURGERY    . HEMORRHOID SURGERY    . HERNIA REPAIR    . lumbar surgery  2000  . SPINE SURGERY    . TONSILLECTOMY AND ADENOIDECTOMY    . TOTAL ABDOMINAL HYSTERECTOMY  1979   postpartum hemorrhage; ovaries intact.   Allergies  Allergen Reactions  . Lisinopril Swelling    Face and lips    Social History   Social History  . Marital status: Married    Spouse name: N/A  . Number of children: 2  . Years of education: N/A   Occupational History  . Retired    Social History Main Topics  . Smoking status: Never Smoker  . Smokeless tobacco: Never Used  . Alcohol use No  . Drug use: No  . Sexual activity: Yes   Other Topics Concern  . Not on file   Social History Narrative   Marital status:  Married  X 43 years, happily , no abuse.       Children:  2 children; 4 grandchildren.      Lives: with husband.      Employment: disability.  Works part-time at [employer]; working PRN.      Tobacco: never      Alcohol: wine once  per month      Drugs: none     Exercise: sporadic in 2016; walking some.     Caffeine Use: Some.    Seatbelt:  Always uses seat belts.       Smoke alarm in the home.   Exercise: Some   Family History  Problem Relation Age of Onset  . Heart disease Mother     CHF  . Heart failure Mother     CHF  . Hypertension Mother   . Stroke Mother   . Cancer Mother 60    unknown primary  . Hypertension Sister   . Crohn's disease Sister   . Arthritis Sister     partial knee replacement.  . Diabetes Daughter   . Coronary artery disease Other     family history  . Cancer Other     family history  . Breast cancer Paternal Aunt        Objective:    BP 108/68   Pulse 77   Temp 97.5 F (36.4 C) (Oral)   Resp 18   Ht 5\' 1"  (1.549 m)   Wt 174 lb 6.4 oz (79.1 kg)   SpO2 94%   BMI 32.95 kg/m   Physical Exam  Constitutional: She is oriented to person,  place, and time. She appears well-developed and well-nourished. No distress.  HENT:  Head: Normocephalic and atraumatic.  Eyes: EOM are normal. Pupils are equal, round, and reactive to light.  Neck: Neck supple.  Cardiovascular: Normal rate, regular rhythm, normal heart sounds and intact distal pulses.   No murmur heard. Pulmonary/Chest: Effort normal. No respiratory distress. She has no wheezes. She has no rales.  Musculoskeletal: Normal range of motion.  Neurological: She is alert and oriented to person, place, and time.  Skin: Skin is warm and dry. She is not diaphoretic.     Stable area 8cm x 10 cm of induration, swelling, erythema, fluctuance; +packing present in wound; +TTP.  No worsening.  Psychiatric: She has a normal mood and affect. Her behavior is normal.  Nursing note and vitals reviewed.       Assessment & Plan:   1. Cellulitis and abscess of trunk    -stable. -wound culture still pending. -s/p repeat Rocephin injection in office. -continue Cipro -packing replaced during visit. -RTC in 3 days with Windell Hummingbird, PA-C.   No orders of the defined types were placed in this encounter.  Meds ordered this encounter  Medications  . cefTRIAXone (ROCEPHIN) injection 1 g    Return in about 3 days (around 08/18/2015) for recheck with [facility], PA-C.    I personally performed the services described in this documentation, which was scribed in my presence. The recorded information has been reviewed and considered.  Icy Fuhrmann Elayne Guerin, M.D. [facility] [address] [place], [place]  [address] [phone] phone [phone] fax

## 2015-08-15 NOTE — Patient Instructions (Signed)
     IF you received an x-ray today, you will receive an invoice from [facility]. Please contact [facility] at [phone] with questions or concerns regarding your invoice.   IF you received labwork today, you will receive an invoice from [facility]. Please contact [facility] at [phone] with questions or concerns regarding your invoice.   Our billing staff will not be able to assist you with questions regarding bills from these companies.  You will be contacted with the lab results as soon as they are available. The fastest way to get your results is to activate your My Chart account. Instructions are located on the last page of this paperwork. If you have not heard from us regarding the results in 2 weeks, please contact this office.      

## 2015-08-17 LAB — WOUND CULTURE: GRAM STAIN: NONE SEEN

## 2015-08-18 ENCOUNTER — Ambulatory Visit (INDEPENDENT_AMBULATORY_CARE_PROVIDER_SITE_OTHER): Payer: 59 | Admitting: Physician Assistant

## 2015-08-18 VITALS — BP 110/76 | HR 73 | Temp 97.9°F | Resp 18 | Ht 61.0 in | Wt 176.0 lb

## 2015-08-18 DIAGNOSIS — L03319 Cellulitis of trunk, unspecified: Secondary | ICD-10-CM

## 2015-08-18 DIAGNOSIS — A4902 Methicillin resistant Staphylococcus aureus infection, unspecified site: Secondary | ICD-10-CM

## 2015-08-18 DIAGNOSIS — Z4801 Encounter for change or removal of surgical wound dressing: Secondary | ICD-10-CM | POA: Diagnosis not present

## 2015-08-18 DIAGNOSIS — L02219 Cutaneous abscess of trunk, unspecified: Secondary | ICD-10-CM

## 2015-08-18 MED ORDER — DOXYCYCLINE HYCLATE 100 MG PO CAPS: 100.0000 mg | ORAL_CAPSULE | Freq: Two times a day (BID) | ORAL | 0 refills | Status: DC

## 2015-08-18 NOTE — Telephone Encounter (Signed)
Noted! Thank you

## 2015-08-18 NOTE — Progress Notes (Signed)
Subjective:    Patient ID: Maureen Hodge, female    DOB: November 27, 1950, 65 y.o.   MRN: [id number]  HPI Patient presents for follow up for wound care on suprapubic abscess. I&D on 08/13/2015 with wound examined and repacked on 08/15/2015. She is feeling better with less pain and pressure. Wound still draining well and she is changing dressings appropriately. No fevers or signs of systemic infection. Finished initial doxycycline, still taking cipro.   Review of Systems  Constitutional: Negative for chills, fatigue and fever.  Gastrointestinal: Negative for abdominal pain, nausea and vomiting.  Musculoskeletal: Negative for myalgias.  Skin: Positive for wound.     Current Outpatient Prescriptions:  .  alendronate (FOSAMAX) 70 MG tablet, Take 70 mg by mouth once a week., Disp: , Rfl: 0 .  atenolol (TENORMIN) 25 MG tablet, TAKE 2 TABLETS BY MOUTH EVERY MORNING AND TAKE 1 TABLET EVERY EVENING, Disp: 270 tablet, Rfl: 3 .  cetirizine (ZYRTEC) 10 MG tablet, Take 10 mg by mouth daily., Disp: , Rfl:  .  Ciclopirox 1 % shampoo, LATHER TO SCALP 1-2 TIMES A WEEK. ALLOW TO SIT 5 MINUTES BEFORE RINSING., Disp: , Rfl: 1 .  clobetasol cream (TEMOVATE) 0.05 %, APPLY A THIN LAYER TO THE AFFECTED AREAS TWICE DAILY AS NEEDED FOR A MAX OF 4 WEEKS PER FLARE, Disp: , Rfl: 1 .  doxycycline (VIBRAMYCIN) 100 MG capsule, Take 1 capsule (100 mg total) by mouth 2 (two) times daily., Disp: 20 capsule, Rfl: 0 .  fexofenadine (ALLEGRA) 180 MG tablet, Take 180 mg by mouth daily., Disp: , Rfl:  .  furosemide (LASIX) 40 MG tablet, Take 1 tablet (40 mg total) by mouth daily., Disp: 90 tablet, Rfl: 1 .  gabapentin (NEURONTIN) 800 MG tablet, TAKE 1 TABLET (800 MG TOTAL) BY MOUTH 4 (FOUR) TIMES DAILY., Disp: 360 tablet, Rfl: 3 .  HYDROcodone-acetaminophen (NORCO/VICODIN) 5-325 MG tablet, Take 1-2 tablets by mouth every 6 (six) hours as needed for moderate pain., Disp: 60 tablet, Rfl: 0 .  hydroxychloroquine (PLAQUENIL) 200 MG  tablet, Take 200 mg by mouth 2 (two) times daily.  , Disp: , Rfl:  .  hydrOXYzine (ATARAX/VISTARIL) 10 MG tablet, TAKE 1-2 TABS AT DINNER AND/OR BEDTIME AS NEEDED FOR ITCHING. WATCH FOR SEDATION., Disp: , Rfl: 2 .  lidocaine (LIDODERM) 5 %, Place 1 patch onto the skin daily. Remove & Discard patch within 12 hours or as directed by MD, Disp: 30 patch, Rfl: 3 .  mometasone (ELOCON) 0.1 % ointment, Apply topically Twice daily as needed., Disp: , Rfl:  .  nitroGLYCERIN (NITROSTAT) 0.4 MG SL tablet, Place 1 tablet (0.4 mg total) under the tongue every 5 (five) minutes as needed for chest pain., Disp: 30 tablet, Rfl: 0 .  nortriptyline (PAMELOR) 25 MG capsule, Take 1 capsule (25 mg total) by mouth 3 (three) times daily., Disp: 90 capsule, Rfl: 3 .  ondansetron (ZOFRAN-ODT) 8 MG disintegrating tablet, Take 1 tablet (8 mg total) by mouth every 8 (eight) hours as needed for nausea., Disp: 20 tablet, Rfl: 0 .  pantoprazole (PROTONIX) 40 MG tablet, Take 1 tablet (40 mg total) by mouth daily., Disp: 90 tablet, Rfl: 3 .  potassium chloride SA (KLOR-CON M20) 20 MEQ tablet, Take 2 tablets (40 mEq total) by mouth 2 (two) times daily., Disp: 360 tablet, Rfl: 3 .  triamterene-hydrochlorothiazide (MAXZIDE-25) 37.5-25 MG tablet, TAKE 1 TABLET BY MOUTH DAILY., Disp: 30 tablet, Rfl: 3 .  valACYclovir (VALTREX) 1000 MG tablet, Take 1 tablet (  1,000 mg total) by mouth 3 (three) times daily., Disp: 21 tablet, Rfl: 0 .  zoster vaccine live, PF, (ZOSTAVAX) 16109 UNT/0.65ML injection, Inject 19,400 Units into the skin once., Disp: 0.65 mL, Rfl: 0 .  albuterol (PROVENTIL HFA;VENTOLIN HFA) 108 (90 BASE) MCG/ACT inhaler, Inhale 2 puffs into the lungs every 6 (six) hours as needed for wheezing or shortness of breath (cough, shortness of breath or wheezing.). (Patient not taking: Reported on 08/15/2015), Disp: 1 Inhaler, Rfl: 0 .  amLODipine (NORVASC) 10 MG tablet, Take 1 tablet (10 mg total) by mouth daily. (Patient not taking: Reported  on 08/18/2015), Disp: 90 tablet, Rfl: 1 .  estradiol (ESTRACE) 2 MG tablet, TAKE 1 TABLET (2 MG TOTAL) BY MOUTH DAILY. (Patient not taking: Reported on 08/15/2015), Disp: 90 tablet, Rfl: 0 .  mycophenolate (CELLCEPT) 500 MG tablet, , Disp: , Rfl:  .  predniSONE (DELTASONE) 10 MG tablet, Take 10 mg by mouth daily with breakfast., Disp: , Rfl:   Allergies  Allergen Reactions  . Lisinopril Swelling    Face and lips      Objective:   Physical Exam  Constitutional: She is oriented to person, place, and time. She appears well-developed and well-nourished. She is cooperative.  Non-toxic appearance. No distress.  Blood pressure 110/76, pulse 73, temperature 97.9 F (36.6 C), temperature source Oral, resp. rate 18, height 5\' 1"  (1.549 m), weight 176 lb (79.8 kg), SpO2 98 %.  HENT:  Head: Normocephalic and atraumatic.  Eyes: Conjunctivae and lids are normal. No scleral icterus.  Neck: Neck supple.  Pulmonary/Chest: Effort normal.  Musculoskeletal: Normal range of motion.  Neurological: She is alert and oriented to person, place, and time.  Skin: Skin is warm and dry. She is not diaphoretic. No pallor.         Results for orders placed or performed in visit on 08/13/15  WOUND CULTURE  Result Value Ref Range   Culture Few METHICILLIN RESISTANT STAPHYLOCOCCUS AUREUS    Gram Stain Rare    Gram Stain WBC present-predominately PMN    Gram Stain No Squamous Epithelial Cells Seen    Gram Stain Rare GRAM POSITIVE COCCI IN PAIRS    Organism ID, Bacteria METHICILLIN RESISTANT STAPHYLOCOCCUS AUREUS       Susceptibility   Methicillin resistant staphylococcus aureus -  (no method available)    OXACILLIN >=4 Resistant     CEFAZOLIN  Resistant     GENTAMICIN <=0.5 Sensitive     CIPROFLOXACIN >=8 Resistant     LEVOFLOXACIN 4 Intermediate     TRIMETH/SULFA <=10 Sensitive     VANCOMYCIN <=0.5 Sensitive     CLINDAMYCIN <=0.25 Sensitive     ERYTHROMYCIN <=0.25 Sensitive     LINEZOLID 2 Sensitive      RIFAMPIN <=0.5 Sensitive     TETRACYCLINE <=1 Sensitive   Comprehensive metabolic panel  Result Value Ref Range   Sodium 140 135 - 146 mmol/L   Potassium 4.4 3.5 - 5.3 mmol/L   Chloride 103 98 - 110 mmol/L   CO2 25 20 - 31 mmol/L   Glucose, Bld 89 65 - 99 mg/dL   BUN 14 7 - 25 mg/dL   Creat 0.74 0.50 - 0.99 mg/dL   Total Bilirubin 0.4 0.2 - 1.2 mg/dL   Alkaline Phosphatase 66 33 - 130 U/L   AST 17 10 - 35 U/L   ALT 9 6 - 29 U/L   Total Protein 6.4 6.1 - 8.1 g/dL   Albumin 3.6 3.6 - 5.1  g/dL   Calcium 9.9 8.6 - 10.4 mg/dL  POCT CBC  Result Value Ref Range   WBC 13.9 (A) 4.6 - 10.2 K/uL   Lymph, poc 3.4 0.6 - 3.4   POC LYMPH PERCENT 24.7 10 - 50 %L   MID (cbc) 0.1 0 - 0.9   POC MID % 1.0 0 - 12 %M   POC Granulocyte 10.3 (A) 2 - 6.9   Granulocyte percent 74.3 37 - 80 %G   RBC 3.56 (A) 4.04 - 5.48 M/uL   Hemoglobin 11.6 (A) 12.2 - 16.2 g/dL   HCT, POC 34.0 (A) 37.7 - 47.9 %   MCV 95.7 80 - 97 fL   MCH, POC 32.8 (A) 27 - 31.2 pg   MCHC 34.3 31.8 - 35.4 g/dL   RDW, POC 13.9 %   Platelet Count, POC 391 142 - 424 K/uL   MPV 6.4 0 - 99.8 fL  POCT glucose (manual entry)  Result Value Ref Range   POC Glucose 95 70 - 99 mg/dl      Assessment & Plan:  1. Cellulitis and abscess of trunk 2. MRSA infection - wound undressed and examined, appears to be healing well. Irrigated and repacked. Redressed. - Culture showed MRSA. Patient started on another course of doxycycline and told to stop the cipro. - continue with local wound care at home. - Follow up on Thursday 08/21/2015 for wound recheck.  Anelis Hrivnak Rayburn PA-S 08/18/15

## 2015-08-18 NOTE — Progress Notes (Signed)
Maureen Hodge  MRN: [id number] DOB: [birth year]  Subjective:  Pt presents to clinic for wound recheck - she has been doing well - the pain is better every day - she is finished her doxy and tolerating the cipro ok - she is now able to wear underwear which she had not been able to because of the pain   Review of Systems  Constitutional: Negative for chills and fever.  Gastrointestinal: Negative for nausea.  Skin: Positive for wound. Negative for rash.    Patient Active Problem List   Diagnosis Date Noted  . 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 OSA on CPAP 07/10/2014  . Need for Zostavax administration 01/23/2013  . 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 Lumbar canal stenosis 08/22/2012  . Routine general medical examination at a health care facility 01/24/2012  . Routine gynecological examination 01/24/2012  . Anemia 01/24/2012  . Pain of left lower leg 01/24/2012  . Hypokalemia 10/25/2011  . GERD (gastroesophageal reflux disease) 10/25/2011  . Meningioma ([place]) 10/25/2011  . Low oxygen saturation 08/17/2011  . 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 Adiposity 06/01/2011  . Cephalalgia 04/11/2011  . EDEMA 05/21/2009  . HYPERTENSION, BENIGN 03/20/2009  . SLEEP RELATED MOVEMENT DISORDER UNSPECIFIED 03/20/2009  . CHEST PAIN-UNSPECIFIED 03/20/2009  . PRE-DIABETES 03/20/2009  . 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 alendronate (FOSAMAX) 70 MG tablet Take 70 mg by mouth once a week.  0  . atenolol (TENORMIN) 25 MG tablet TAKE 2 TABLETS BY MOUTH EVERY MORNING AND TAKE 1 TABLET EVERY EVENING 270 tablet 3  . cetirizine (ZYRTEC) 10 MG tablet Take 10 mg by mouth daily.    . Ciclopirox 1 % shampoo LATHER TO SCALP  1-2 TIMES A WEEK. ALLOW TO SIT 5 MINUTES BEFORE RINSING.  1  . clobetasol cream (TEMOVATE) 0.05 % APPLY A THIN LAYER TO THE AFFECTED AREAS TWICE DAILY AS NEEDED FOR A MAX OF 4 WEEKS PER FLARE  1  . fexofenadine (ALLEGRA) 180 MG tablet Take 180 mg by mouth daily.    . furosemide (LASIX) 40 MG tablet Take 1 tablet (40 mg total) by mouth daily. 90 tablet 1  . gabapentin (NEURONTIN) 800 MG tablet TAKE 1 TABLET (800 MG TOTAL) BY MOUTH 4 (FOUR) TIMES DAILY. 360 tablet 3  . HYDROcodone-acetaminophen (NORCO/VICODIN) 5-325 MG tablet Take 1-2 tablets by mouth every 6 (six) hours as needed for moderate pain. 60 tablet 0  . hydroxychloroquine (PLAQUENIL) 200 MG tablet Take 200 mg by mouth 2 (two) times daily.      . hydrOXYzine (ATARAX/VISTARIL) 10 MG tablet TAKE 1-2 TABS AT DINNER AND/OR BEDTIME AS NEEDED FOR ITCHING. WATCH FOR SEDATION.  2  . lidocaine (LIDODERM) 5 % Place 1 patch onto the skin daily. Remove & Discard patch within 12 hours or as directed by MD 30 patch 3  . mometasone (ELOCON) 0.1 % ointment Apply topically Twice daily as needed.    . nitroGLYCERIN (NITROSTAT) 0.4 MG SL tablet Place 1 tablet (0.4 mg total) under the tongue every 5 (five) minutes as needed for chest pain. 30 tablet 0  . nortriptyline (PAMELOR) 25 MG capsule Take 1 capsule (25 mg total) by mouth 3 (three) times daily. 90 capsule 3  . ondansetron (ZOFRAN-ODT) 8 MG  disintegrating tablet Take 1 tablet (8 mg total) by mouth every 8 (eight) hours as needed for nausea. 20 tablet 0  . pantoprazole (PROTONIX) 40 MG tablet Take 1 tablet (40 mg total) by mouth daily. 90 tablet 3  . potassium chloride SA (KLOR-CON M20) 20 MEQ tablet Take 2 tablets (40 mEq total) by mouth 2 (two) times daily. 360 tablet 3  . triamterene-hydrochlorothiazide (MAXZIDE-25) 37.5-25 MG tablet TAKE 1 TABLET BY MOUTH DAILY. 30 tablet 3  . valACYclovir (VALTREX) 1000 MG tablet Take 1 tablet (1,000 mg total) by mouth 3 (three) times daily. 21 tablet 0  . zoster  vaccine live, PF, (ZOSTAVAX) 09811 UNT/0.65ML injection Inject 19,400 Units into the skin once. 0.65 mL 0  . albuterol (PROVENTIL HFA;VENTOLIN HFA) 108 (90 BASE) MCG/ACT inhaler Inhale 2 puffs into the lungs every 6 (six) hours as needed for wheezing or shortness of breath (cough, shortness of breath or wheezing.). (Patient not taking: Reported on 08/15/2015) 1 Inhaler 0  . amLODipine (NORVASC) 10 MG tablet Take 1 tablet (10 mg total) by mouth daily. (Patient not taking: Reported on 08/18/2015) 90 tablet 1  . estradiol (ESTRACE) 2 MG tablet TAKE 1 TABLET (2 MG TOTAL) BY MOUTH DAILY. (Patient not taking: Reported on 08/15/2015) 90 tablet 0  . mycophenolate (CELLCEPT) 500 MG tablet     . predniSONE (DELTASONE) 10 MG tablet Take 10 mg by mouth daily with breakfast.     No current facility-administered medications on file prior to visit.     Allergies  Allergen Reactions  . Lisinopril Swelling    Face and lips    Pt patients past, family and social history were reviewed and updated.  Objective:  BP 110/76 (BP Location: Right Arm, Patient Position: Sitting, Cuff Size: Normal)   Pulse 73   Temp 97.9 F (36.6 C) (Oral)   Resp 18   Ht 5\' 1"  (1.549 m)   Wt 176 lb (79.8 kg)   SpO2 98%   BMI 33.25 kg/m   Physical Exam  Constitutional: She is oriented to person, place, and time and well-developed, well-nourished, and in no distress.  HENT:  Head: Normocephalic and atraumatic.  Right Ear: Hearing and external ear normal.  Left Ear: Hearing and external ear normal.  Eyes: Conjunctivae are normal.  Neck: Normal range of motion.  Pulmonary/Chest: Effort normal.  Neurological: She is alert and oriented to person, place, and time. Gait normal.  Skin: Skin is warm and dry.  drsg and packing removed - decrease erythema and induration - no purulence expressed - good granulation tissue present - irrigated with lido and repacked with 1/2 in plain packing - drsg placed  Psychiatric: Mood, memory,  affect and judgment normal.  Vitals reviewed.  Results for orders placed or performed in visit on 08/13/15  WOUND CULTURE  Result Value Ref Range   Culture Few METHICILLIN RESISTANT STAPHYLOCOCCUS AUREUS    Gram Stain Rare    Gram Stain WBC present-predominately PMN    Gram Stain No Squamous Epithelial Cells Seen    Gram Stain Rare GRAM POSITIVE COCCI IN PAIRS    Organism ID, Bacteria METHICILLIN RESISTANT STAPHYLOCOCCUS AUREUS       Susceptibility   Methicillin resistant staphylococcus aureus -  (no method available)    OXACILLIN >=4 Resistant     CEFAZOLIN  Resistant     GENTAMICIN <=0.5 Sensitive     CIPROFLOXACIN >=8 Resistant     LEVOFLOXACIN 4 Intermediate     TRIMETH/SULFA <=10 Sensitive  VANCOMYCIN <=0.5 Sensitive     CLINDAMYCIN <=0.25 Sensitive     ERYTHROMYCIN <=0.25 Sensitive     LINEZOLID 2 Sensitive     RIFAMPIN <=0.5 Sensitive     TETRACYCLINE <=1 Sensitive   Comprehensive metabolic panel  Result Value Ref Range   Sodium 140 135 - 146 mmol/L   Potassium 4.4 3.5 - 5.3 mmol/L   Chloride 103 98 - 110 mmol/L   CO2 25 20 - 31 mmol/L   Glucose, Bld 89 65 - 99 mg/dL   BUN 14 7 - 25 mg/dL   Creat 0.74 0.50 - 0.99 mg/dL   Total Bilirubin 0.4 0.2 - 1.2 mg/dL   Alkaline Phosphatase 66 33 - 130 U/L   AST 17 10 - 35 U/L   ALT 9 6 - 29 U/L   Total Protein 6.4 6.1 - 8.1 g/dL   Albumin 3.6 3.6 - 5.1 g/dL   Calcium 9.9 8.6 - 10.4 mg/dL  POCT CBC  Result Value Ref Range   WBC 13.9 (A) 4.6 - 10.2 K/uL   Lymph, poc 3.4 0.6 - 3.4   POC LYMPH PERCENT 24.7 10 - 50 %L   MID (cbc) 0.1 0 - 0.9   POC MID % 1.0 0 - 12 %M   POC Granulocyte 10.3 (A) 2 - 6.9   Granulocyte percent 74.3 37 - 80 %G   RBC 3.56 (A) 4.04 - 5.48 M/uL   Hemoglobin 11.6 (A) 12.2 - 16.2 g/dL   HCT, POC 34.0 (A) 37.7 - 47.9 %   MCV 95.7 80 - 97 fL   MCH, POC 32.8 (A) 27 - 31.2 pg   MCHC 34.3 31.8 - 35.4 g/dL   RDW, POC 13.9 %   Platelet Count, POC 391 142 - 424 K/uL   MPV 6.4 0 - 99.8 fL  POCT  glucose (manual entry)  Result Value Ref Range   POC Glucose 95 70 - 99 mg/dl    Assessment and Plan :  Cellulitis and abscess of trunk  MRSA infection   Abscess is improved - continue daily drsg changes - culture is back and it is MRSA and it is resistance to cipro so that was stopped and doxy was restarted for an additional 10 days.  Recheck in 4 days  Windell Hummingbird PA-C  [facility] 08/18/2015 2:39 PM

## 2015-08-21 ENCOUNTER — Encounter: Payer: Self-pay | Admitting: Physician Assistant

## 2015-08-21 ENCOUNTER — Ambulatory Visit (INDEPENDENT_AMBULATORY_CARE_PROVIDER_SITE_OTHER): Payer: 59 | Admitting: Physician Assistant

## 2015-08-21 ENCOUNTER — Telehealth: Payer: Self-pay

## 2015-08-21 VITALS — BP 120/92 | HR 72 | Temp 97.8°F | Resp 16 | Ht 61.0 in | Wt 177.0 lb

## 2015-08-21 DIAGNOSIS — L03319 Cellulitis of trunk, unspecified: Secondary | ICD-10-CM

## 2015-08-21 DIAGNOSIS — A4902 Methicillin resistant Staphylococcus aureus infection, unspecified site: Secondary | ICD-10-CM

## 2015-08-21 DIAGNOSIS — L02219 Cutaneous abscess of trunk, unspecified: Secondary | ICD-10-CM

## 2015-08-21 MED ORDER — HYDROCODONE-ACETAMINOPHEN 5-325 MG PO TABS: 1.0000 | ORAL_TABLET | Freq: Four times a day (QID) | ORAL | 0 refills | Status: DC | PRN

## 2015-08-21 NOTE — Telephone Encounter (Signed)
Done

## 2015-08-21 NOTE — Patient Instructions (Addendum)
Recheck with me on Tuesday - call on Monday for an appt on Tuesday with me    IF you received an x-ray today, you will receive an invoice from [facility]. Please contact [facility] at [phone] with questions or concerns regarding your invoice.   IF you received labwork today, you will receive an invoice from [employer]. Please contact [facility] at [phone] with questions or concerns regarding your invoice.   Our billing staff will not be able to assist you with questions regarding bills from these companies.  You will be contacted with the lab results as soon as they are available. The fastest way to get your results is to activate your My Chart account. Instructions are located on the last page of this paperwork. If you have not heard from [place] regarding the results in 2 weeks, please contact this office.

## 2015-08-21 NOTE — Telephone Encounter (Signed)
Routed to Sarah 

## 2015-08-21 NOTE — Progress Notes (Signed)
MERCURY SCHULMEISTER  MRN: [id number] DOB: 12-12-1950  Subjective:  Pt presents to clinic for wound recheck.  She has been tolerating her abx ok.  She needs more pain medications for her shingles both the Norco (she is taking at least 1 every 4h and sometimes 2) and she is using her lidoderm patches as well as her gabapentin.  She is still having significant leg pain.  Review of Systems  Constitutional: Negative for chills and fever.  Gastrointestinal: Negative for nausea.  Skin: Positive for wound. Negative for rash.    Patient Active Problem List   Diagnosis Date Noted  . 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 OSA on CPAP 07/10/2014  . Need for Zostavax administration 01/23/2013  . 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 Lumbar canal stenosis 08/22/2012  . Routine general medical examination at a health care facility 01/24/2012  . Routine gynecological examination 01/24/2012  . Anemia 01/24/2012  . Pain of left lower leg 01/24/2012  . Hypokalemia 10/25/2011  . GERD (gastroesophageal reflux disease) 10/25/2011  . Meningioma ([place]) 10/25/2011  . Low oxygen saturation 08/17/2011  . 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 Adiposity 06/01/2011  . Cephalalgia 04/11/2011  . EDEMA 05/21/2009  . HYPERTENSION, BENIGN 03/20/2009  . SLEEP RELATED MOVEMENT DISORDER UNSPECIFIED 03/20/2009  . CHEST PAIN-UNSPECIFIED 03/20/2009  . PRE-DIABETES 03/20/2009  . 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 alendronate (FOSAMAX) 70 MG tablet Take 70 mg by mouth once a week.  0  . atenolol (TENORMIN) 25 MG tablet TAKE 2 TABLETS BY MOUTH EVERY MORNING AND TAKE 1 TABLET EVERY EVENING 270 tablet 3  . cetirizine (ZYRTEC) 10 MG tablet Take 10 mg  by mouth daily.    . Ciclopirox 1 % shampoo LATHER TO SCALP 1-2 TIMES A WEEK. ALLOW TO SIT 5 MINUTES BEFORE RINSING.  1  . clobetasol cream (TEMOVATE) 0.05 % APPLY A THIN LAYER TO THE AFFECTED AREAS TWICE DAILY AS NEEDED FOR A MAX OF 4 WEEKS PER FLARE  1  . doxycycline (VIBRAMYCIN) 100 MG capsule Take 1 capsule (100 mg total) by mouth 2 (two) times daily. 20 capsule 0  . fexofenadine (ALLEGRA) 180 MG tablet Take 180 mg by mouth daily.    . furosemide (LASIX) 40 MG tablet Take 1 tablet (40 mg total) by mouth daily. 90 tablet 1  . gabapentin (NEURONTIN) 800 MG tablet TAKE 1 TABLET (800 MG TOTAL) BY MOUTH 4 (FOUR) TIMES DAILY. 360 tablet 3  . hydroxychloroquine (PLAQUENIL) 200 MG tablet Take 200 mg by mouth 2 (two) times daily.      . hydrOXYzine (ATARAX/VISTARIL) 10 MG tablet TAKE 1-2 TABS AT DINNER AND/OR BEDTIME AS NEEDED FOR ITCHING. WATCH FOR SEDATION.  2  . lidocaine (LIDODERM) 5 % Place 1 patch onto the skin daily. Remove & Discard patch within 12 hours or as directed by MD 30 patch 3  . mometasone (ELOCON) 0.1 % ointment Apply topically Twice daily as needed.    . mycophenolate (CELLCEPT) 500 MG tablet     . nitroGLYCERIN (NITROSTAT) 0.4 MG SL tablet Place 1 tablet (0.4 mg total) under the tongue every 5 (five) minutes as needed for chest pain. 30 tablet 0  . nortriptyline (PAMELOR) 25 MG capsule Take 1 capsule (25 mg total)  by mouth 3 (three) times daily. 90 capsule 3  . ondansetron (ZOFRAN-ODT) 8 MG disintegrating tablet Take 1 tablet (8 mg total) by mouth every 8 (eight) hours as needed for nausea. 20 tablet 0  . pantoprazole (PROTONIX) 40 MG tablet Take 1 tablet (40 mg total) by mouth daily. 90 tablet 3  . potassium chloride SA (KLOR-CON M20) 20 MEQ tablet Take 2 tablets (40 mEq total) by mouth 2 (two) times daily. 360 tablet 3  . triamterene-hydrochlorothiazide (MAXZIDE-25) 37.5-25 MG tablet TAKE 1 TABLET BY MOUTH DAILY. 30 tablet 3  . valACYclovir (VALTREX) 1000 MG tablet Take 1 tablet  (1,000 mg total) by mouth 3 (three) times daily. 21 tablet 0  . albuterol (PROVENTIL HFA;VENTOLIN HFA) 108 (90 BASE) MCG/ACT inhaler Inhale 2 puffs into the lungs every 6 (six) hours as needed for wheezing or shortness of breath (cough, shortness of breath or wheezing.). (Patient not taking: Reported on 08/15/2015) 1 Inhaler 0  . amLODipine (NORVASC) 10 MG tablet Take 1 tablet (10 mg total) by mouth daily. (Patient not taking: Reported on 08/21/2015) 90 tablet 1  . estradiol (ESTRACE) 2 MG tablet TAKE 1 TABLET (2 MG TOTAL) BY MOUTH DAILY. (Patient not taking: Reported on 08/15/2015) 90 tablet 0  . zoster vaccine live, PF, (ZOSTAVAX) 16109 UNT/0.65ML injection Inject 19,400 Units into the skin once. (Patient not taking: Reported on 08/21/2015) 0.65 mL 0   No current facility-administered medications on file prior to visit.     Allergies  Allergen Reactions  . Lisinopril Swelling    Face and lips    Pt patients past, family and social history were reviewed and updated.  Objective:  BP (!) 120/92 (BP Location: Right Arm, Patient Position: Sitting, Cuff Size: Normal)   Pulse 72   Temp 97.8 F (36.6 C) (Oral)   Resp 16   Ht 5\' 1"  (1.549 m)   Wt 177 lb (80.3 kg)   SpO2 95%   BMI 33.44 kg/m   Physical Exam  Constitutional: She is oriented to person, place, and time and well-developed, well-nourished, and in no distress.  HENT:  Head: Normocephalic and atraumatic.  Right Ear: Hearing and external ear normal.  Left Ear: Hearing and external ear normal.  Eyes: Conjunctivae are normal.  Neck: Normal range of motion.  Pulmonary/Chest: Effort normal.  Neurological: She is alert and oriented to person, place, and time. Gait normal.  Skin: Skin is warm and dry.  2 lidoderm patches on her left leg Dressing and packing removed - no purulence expressed the medial aspect of the wound is healing in nicely there is still a track going lateral - the erythema is improved and the induration has slightly  improved -- the wound is irrigated and repacked loosely with 1/2 in plain packing - the skin surrounding the incision is irritated from the tape and raw appearing  Psychiatric: Mood, memory, affect and judgment normal.  Vitals reviewed.   Assessment and Plan :  Cellulitis and abscess of trunk  MRSA infection  Continue daily dressing changes.  Continue abx.  Refilled all her pain medications.  Recheck on Tuesday (8/22) try and keep the wound open to air some to allow the skin around the incision to start to heal  Windell Hummingbird PA-C  [facility] 08/21/2015 8:28 PM

## 2015-08-21 NOTE — Telephone Encounter (Signed)
Patient stated she was seen today by Windell Hummingbird. Patient stated she forgot to tell Judson Roch about her lidocaine patches. Patient have a prescription for the pain. Patient need Dr. Tamala Julian or Chelle to fax over a prescription to her pharmacy for the lidocaine patches. [phone].

## 2015-08-22 NOTE — Telephone Encounter (Signed)
Patient is calling because she still hasn't gotten the prescription for the licodicaine patches. Patient asked if we could call once completed. [phone]

## 2015-08-22 NOTE — Telephone Encounter (Signed)
Patient is calling again concerning her Lidocaine patches. Patient stated she checked with her pharmacy and nothing was called in. Please call the patient as soon as possible. She is in a lot of pain. [phone]

## 2015-08-23 NOTE — Telephone Encounter (Signed)
Pt called to check the status of request///   Pt. States she was told to use 2 patches (per. Dr. Tamala Julian and has exhausted rx/// please re-write rx or call ins. For exception.  Please call [phone]

## 2015-08-25 ENCOUNTER — Ambulatory Visit: Admission: RE | Admit: 2015-08-25 | Payer: 59 | Source: Ambulatory Visit | Admitting: Gastroenterology

## 2015-08-25 ENCOUNTER — Encounter: Admission: RE | Payer: Self-pay | Source: Ambulatory Visit

## 2015-08-25 SURGERY — COLONOSCOPY WITH PROPOFOL
Anesthesia: Choice

## 2015-08-25 MED ORDER — LIDOCAINE 5 % EX PTCH: 3.0000 | MEDICATED_PATCH | CUTANEOUS | 3 refills | Status: DC

## 2015-08-25 NOTE — Telephone Encounter (Signed)
Revised rx for Lidoderm patches; she can use 1-3 patches per day.

## 2015-08-25 NOTE — Telephone Encounter (Signed)
Notified pt of change and Rx being sent.

## 2015-08-26 ENCOUNTER — Ambulatory Visit: Payer: 59

## 2015-08-26 ENCOUNTER — Ambulatory Visit (INDEPENDENT_AMBULATORY_CARE_PROVIDER_SITE_OTHER): Payer: 59 | Admitting: Physician Assistant

## 2015-08-26 VITALS — BP 118/76 | HR 82 | Temp 98.4°F | Resp 18 | Ht 61.0 in

## 2015-08-26 DIAGNOSIS — A4902 Methicillin resistant Staphylococcus aureus infection, unspecified site: Secondary | ICD-10-CM | POA: Diagnosis not present

## 2015-08-26 DIAGNOSIS — L03319 Cellulitis of trunk, unspecified: Secondary | ICD-10-CM

## 2015-08-26 DIAGNOSIS — L02219 Cutaneous abscess of trunk, unspecified: Secondary | ICD-10-CM | POA: Diagnosis not present

## 2015-08-26 DIAGNOSIS — B369 Superficial mycosis, unspecified: Secondary | ICD-10-CM

## 2015-08-26 MED ORDER — CLOTRIMAZOLE 1 % EX CREA: 1.0000 "application " | TOPICAL_CREAM | Freq: Two times a day (BID) | CUTANEOUS | 0 refills | Status: DC

## 2015-08-26 NOTE — Progress Notes (Signed)
Maureen Hodge  MRN: [id number] DOB: 03/28/1950  Subjective:  Pt presents to clinic for wound recheck. She will run out of abx tomorrow - she has been changing drsg daily.  She is having less pain every day.  Review of Systems  Constitutional: Negative for chills and fever.  Gastrointestinal: Negative for nausea.  Skin: Positive for wound. Negative for rash.    Patient Active Problem List   Diagnosis Date Noted  . 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 OSA on CPAP 07/10/2014  . Need for Zostavax administration 01/23/2013  . 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 Lumbar canal stenosis 08/22/2012  . Routine general medical examination at a health care facility 01/24/2012  . Routine gynecological examination 01/24/2012  . Anemia 01/24/2012  . Pain of left lower leg 01/24/2012  . Hypokalemia 10/25/2011  . GERD (gastroesophageal reflux disease) 10/25/2011  . Meningioma ([place]) 10/25/2011  . Low oxygen saturation 08/17/2011  . Benign neoplasm of cerebral meninges ([place]) 06/01/2011  . Adiposity 06/01/2011  . Cephalalgia 04/11/2011  . EDEMA 05/21/2009  . HYPERTENSION, BENIGN 03/20/2009  . SLEEP RELATED MOVEMENT DISORDER UNSPECIFIED 03/20/2009  . CHEST PAIN-UNSPECIFIED 03/20/2009  . PRE-DIABETES 03/20/2009  . 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 albuterol (PROVENTIL HFA;VENTOLIN HFA) 108 (90 BASE) MCG/ACT inhaler Inhale 2 puffs into the lungs every 6 (six) hours as needed for wheezing or shortness of breath (cough, shortness of breath or wheezing.). 1 Inhaler 0  . atenolol (TENORMIN) 25 MG tablet TAKE 2 TABLETS BY MOUTH EVERY MORNING AND TAKE 1 TABLET EVERY EVENING 270 tablet 3  . cetirizine (ZYRTEC) 10 MG tablet Take 10 mg by  mouth daily.    . Ciclopirox 1 % shampoo LATHER TO SCALP 1-2 TIMES A WEEK. ALLOW TO SIT 5 MINUTES BEFORE RINSING.  1  . clobetasol cream (TEMOVATE) 0.05 % APPLY A THIN LAYER TO THE AFFECTED AREAS TWICE DAILY AS NEEDED FOR A MAX OF 4 WEEKS PER FLARE  1  . doxycycline (VIBRAMYCIN) 100 MG capsule Take 1 capsule (100 mg total) by mouth 2 (two) times daily. 20 capsule 0  . fexofenadine (ALLEGRA) 180 MG tablet Take 180 mg by mouth daily.    . furosemide (LASIX) 40 MG tablet Take 1 tablet (40 mg total) by mouth daily. 90 tablet 1  . gabapentin (NEURONTIN) 800 MG tablet TAKE 1 TABLET (800 MG TOTAL) BY MOUTH 4 (FOUR) TIMES DAILY. 360 tablet 3  . HYDROcodone-acetaminophen (NORCO/VICODIN) 5-325 MG tablet Take 1-2 tablets by mouth every 6 (six) hours as needed for moderate pain. 60 tablet 0  . hydroxychloroquine (PLAQUENIL) 200 MG tablet Take 200 mg by mouth 2 (two) times daily.      . hydrOXYzine (ATARAX/VISTARIL) 10 MG tablet TAKE 1-2 TABS AT DINNER AND/OR BEDTIME AS NEEDED FOR ITCHING. WATCH FOR SEDATION.  2  . lidocaine (LIDODERM) 5 % Place 3 patches onto the skin daily. Remove & Discard patch within 12 hours or as directed by MD 90 patch 3  . mometasone (ELOCON) 0.1 % ointment Apply topically Twice daily as needed.    . mycophenolate (CELLCEPT) 500 MG tablet     . nitroGLYCERIN (NITROSTAT) 0.4 MG SL tablet Place 1 tablet (0.4 mg total) under the tongue every 5 (five) minutes as needed for chest pain.  30 tablet 0  . nortriptyline (PAMELOR) 25 MG capsule Take 1 capsule (25 mg total) by mouth 3 (three) times daily. 90 capsule 3  . ondansetron (ZOFRAN-ODT) 8 MG disintegrating tablet Take 1 tablet (8 mg total) by mouth every 8 (eight) hours as needed for nausea. 20 tablet 0  . pantoprazole (PROTONIX) 40 MG tablet Take 1 tablet (40 mg total) by mouth daily. 90 tablet 3  . potassium chloride SA (KLOR-CON M20) 20 MEQ tablet Take 2 tablets (40 mEq total) by mouth 2 (two) times daily. 360 tablet 3  .  triamterene-hydrochlorothiazide (MAXZIDE-25) 37.5-25 MG tablet TAKE 1 TABLET BY MOUTH DAILY. 30 tablet 3  . valACYclovir (VALTREX) 1000 MG tablet Take 1 tablet (1,000 mg total) by mouth 3 (three) times daily. 21 tablet 0  . zoster vaccine live, PF, (ZOSTAVAX) 16109 UNT/0.65ML injection Inject 19,400 Units into the skin once. 0.65 mL 0  . alendronate (FOSAMAX) 70 MG tablet Take 70 mg by mouth once a week.  0  . amLODipine (NORVASC) 10 MG tablet Take 1 tablet (10 mg total) by mouth daily. (Patient not taking: Reported on 08/26/2015) 90 tablet 1  . estradiol (ESTRACE) 2 MG tablet TAKE 1 TABLET (2 MG TOTAL) BY MOUTH DAILY. (Patient not taking: Reported on 08/26/2015) 90 tablet 0   No current facility-administered medications on file prior to visit.     Allergies  Allergen Reactions  . Lisinopril Swelling    Face and lips    Pt patients past, family and social history were reviewed and updated.  Objective:  BP 118/76 (BP Location: Right Arm, Patient Position: Sitting, Cuff Size: Small)   Pulse 82   Temp 98.4 F (36.9 C) (Oral)   Resp 18   Ht 5\' 1"  (1.549 m)   SpO2 97%   Physical Exam  Constitutional: She is oriented to person, place, and time and well-developed, well-nourished, and in no distress.  HENT:  Head: Normocephalic and atraumatic.  Right Ear: Hearing and external ear normal.  Left Ear: Hearing and external ear normal.  Eyes: Conjunctivae are normal.  Neck: Normal range of motion.  Pulmonary/Chest: Effort normal.  Neurological: She is alert and oriented to person, place, and time. Gait normal.  Skin: Skin is warm and dry.  drsg and packing removed - granulation tissue around the entire wound - no necrotic tissue seen - wound tracks laterally at least 1 cm - the track medial has healed - irrigated with 1% lidocaine - repacked loosely with 1/2 in plain packing.  Psychiatric: Mood, memory, affect and judgment normal.  Vitals reviewed.   Assessment and Plan :  Fungal  infection of skin - Plan: clotrimazole (LOTRIMIN) 1 % cream  Cellulitis and abscess of trunk  MRSA infection   Recheck on Sat 8/26 - change drsg daily - keep area open to air some during the day  Windell Hummingbird PA-C  [facility] 08/26/2015 2:51 PM

## 2015-08-26 NOTE — Patient Instructions (Addendum)
Apply cream to the skin under your breast - keep the area dry Continue to change the dressing daily Call on Friday for an appt with me on Sat    IF you received an x-ray today, you will receive an invoice from [facility]. Please contact [facility] at [phone] with questions or concerns regarding your invoice.   IF you received labwork today, you will receive an invoice from [facility]. Please contact [facility] at [phone] with questions or concerns regarding your invoice.   Our billing staff will not be able to assist you with questions regarding bills from these companies.  You will be contacted with the lab results as soon as they are available. The fastest way to get your results is to activate your My Chart account. Instructions are located on the last page of this paperwork. If you have not heard from [place] regarding the results in 2 weeks, please contact this office.

## 2015-08-29 ENCOUNTER — Other Ambulatory Visit: Payer: Self-pay | Admitting: Family Medicine

## 2015-08-30 ENCOUNTER — Ambulatory Visit (INDEPENDENT_AMBULATORY_CARE_PROVIDER_SITE_OTHER): Payer: 59 | Admitting: Physician Assistant

## 2015-08-30 VITALS — BP 122/72 | HR 76 | Temp 97.4°F | Resp 17 | Ht 61.0 in | Wt 175.0 lb

## 2015-08-30 DIAGNOSIS — L02219 Cutaneous abscess of trunk, unspecified: Secondary | ICD-10-CM

## 2015-08-30 DIAGNOSIS — A4902 Methicillin resistant Staphylococcus aureus infection, unspecified site: Secondary | ICD-10-CM | POA: Diagnosis not present

## 2015-08-30 DIAGNOSIS — L03319 Cellulitis of trunk, unspecified: Secondary | ICD-10-CM | POA: Diagnosis not present

## 2015-08-30 MED ORDER — HYDROCODONE-ACETAMINOPHEN 5-325 MG PO TABS: 1.0000 | ORAL_TABLET | Freq: Four times a day (QID) | ORAL | 0 refills | Status: DC | PRN

## 2015-08-30 NOTE — Patient Instructions (Signed)
     IF you received an x-ray today, you will receive an invoice from [facility]. Please contact [facility] at [phone] with questions or concerns regarding your invoice.   IF you received labwork today, you will receive an invoice from [facility]. Please contact [facility] at [phone] with questions or concerns regarding your invoice.   Our billing staff will not be able to assist you with questions regarding bills from these companies.  You will be contacted with the lab results as soon as they are available. The fastest way to get your results is to activate your My Chart account. Instructions are located on the last page of this paperwork. If you have not heard from us regarding the results in 2 weeks, please contact this office.      

## 2015-08-30 NOTE — Progress Notes (Signed)
Maureen Hodge  MRN: [id number] DOB: 1950-05-21  Subjective:  Pt presents to clinic for recheck-- she has been changing the drsg daily - her pain is less - almost no drainage from the wound  Review of Systems  Constitutional: Negative for chills and fever.  Gastrointestinal: Negative for nausea.  Skin: Positive for wound. Negative for rash.    Patient Active Problem List   Diagnosis Date Noted  . 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 OSA on CPAP 07/10/2014  . Need for Zostavax administration 01/23/2013  . 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 Lumbar canal stenosis 08/22/2012  . Routine general medical examination at a health care facility 01/24/2012  . Routine gynecological examination 01/24/2012  . Anemia 01/24/2012  . Pain of left lower leg 01/24/2012  . Hypokalemia 10/25/2011  . GERD (gastroesophageal reflux disease) 10/25/2011  . Meningioma ([place]) 10/25/2011  . Low oxygen saturation 08/17/2011  . Benign neoplasm of cerebral meninges ([place]) 06/01/2011  . Adiposity 06/01/2011  . Cephalalgia 04/11/2011  . EDEMA 05/21/2009  . HYPERTENSION, BENIGN 03/20/2009  . SLEEP RELATED MOVEMENT DISORDER UNSPECIFIED 03/20/2009  . CHEST PAIN-UNSPECIFIED 03/20/2009  . PRE-DIABETES 03/20/2009  . 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 Benign cerebral hemangioma ([place]) 06/22/2001  . Hemangioma of intracranial structure ([place]) 06/22/2001    Current Outpatient Prescriptions on File Prior to Visit  Medication Sig Dispense Refill  . albuterol (PROVENTIL HFA;VENTOLIN HFA) 108 (90 BASE) MCG/ACT inhaler Inhale 2 puffs into the lungs every 6 (six) hours as needed for wheezing or shortness of breath (cough, shortness of breath or wheezing.). 1 Inhaler 0  . alendronate (FOSAMAX) 70 MG tablet Take 70 mg by mouth once a week.  0  . atenolol (TENORMIN) 25 MG tablet TAKE 2 TABLETS BY MOUTH EVERY MORNING AND TAKE 1 TABLET EVERY EVENING 270 tablet  3  . cetirizine (ZYRTEC) 10 MG tablet Take 10 mg by mouth daily.    . Ciclopirox 1 % shampoo LATHER TO SCALP 1-2 TIMES A WEEK. ALLOW TO SIT 5 MINUTES BEFORE RINSING.  1  . clobetasol cream (TEMOVATE) 0.05 % APPLY A THIN LAYER TO THE AFFECTED AREAS TWICE DAILY AS NEEDED FOR A MAX OF 4 WEEKS PER FLARE  1  . clotrimazole (LOTRIMIN) 1 % cream Apply 1 application topically 2 (two) times daily. 30 g 0  . fexofenadine (ALLEGRA) 180 MG tablet Take 180 mg by mouth daily.    . furosemide (LASIX) 40 MG tablet Take 1 tablet (40 mg total) by mouth daily. 90 tablet 1  . gabapentin (NEURONTIN) 800 MG tablet TAKE 1 TABLET (800 MG TOTAL) BY MOUTH 4 (FOUR) TIMES DAILY. 360 tablet 3  . hydroxychloroquine (PLAQUENIL) 200 MG tablet Take 200 mg by mouth 2 (two) times daily.      . hydrOXYzine (ATARAX/VISTARIL) 10 MG tablet TAKE 1-2 TABS AT DINNER AND/OR BEDTIME AS NEEDED FOR ITCHING. WATCH FOR SEDATION.  2  . lidocaine (LIDODERM) 5 % Place 3 patches onto the skin daily. Remove & Discard patch within 12 hours or as directed by MD 90 patch 3  . mometasone (ELOCON) 0.1 % ointment Apply topically Twice daily as needed.    . mycophenolate (CELLCEPT) 500 MG tablet     . nitroGLYCERIN (NITROSTAT) 0.4 MG SL tablet Place 1 tablet (0.4 mg total) under the tongue every 5 (five) minutes as needed for chest pain. 30 tablet 0  . nortriptyline (PAMELOR) 25 MG capsule Take 1 capsule (25 mg  total) by mouth 3 (three) times daily. 90 capsule 3  . ondansetron (ZOFRAN-ODT) 8 MG disintegrating tablet Take 1 tablet (8 mg total) by mouth every 8 (eight) hours as needed for nausea. 20 tablet 0  . pantoprazole (PROTONIX) 40 MG tablet TAKE 1 TABLET (40 MG TOTAL) BY MOUTH DAILY. 90 tablet 0  . potassium chloride SA (KLOR-CON M20) 20 MEQ tablet Take 2 tablets (40 mEq total) by mouth 2 (two) times daily. 360 tablet 3  . triamterene-hydrochlorothiazide (MAXZIDE-25) 37.5-25 MG tablet TAKE 1 TABLET BY MOUTH DAILY. 30 tablet 3  . valACYclovir (VALTREX)  1000 MG tablet Take 1 tablet (1,000 mg total) by mouth 3 (three) times daily. 21 tablet 0  . zoster vaccine live, PF, (ZOSTAVAX) 09811 UNT/0.65ML injection Inject 19,400 Units into the skin once. 0.65 mL 0  . amLODipine (NORVASC) 10 MG tablet Take 1 tablet (10 mg total) by mouth daily. (Patient not taking: Reported on 08/26/2015) 90 tablet 1  . doxycycline (VIBRAMYCIN) 100 MG capsule Take 1 capsule (100 mg total) by mouth 2 (two) times daily. (Patient not taking: Reported on 08/30/2015) 20 capsule 0  . estradiol (ESTRACE) 2 MG tablet TAKE 1 TABLET (2 MG TOTAL) BY MOUTH DAILY. (Patient not taking: Reported on 08/26/2015) 90 tablet 0   No current facility-administered medications on file prior to visit.     Allergies  Allergen Reactions  . Lisinopril Swelling    Face and lips    Pt patients past, family and social history were reviewed and updated.  Objective:  BP 122/72 (BP Location: Right Arm, Patient Position: Sitting, Cuff Size: Normal)   Pulse 76   Temp 97.4 F (36.3 C) (Oral)   Resp 17   Ht 5\' 1"  (1.549 m)   Wt 175 lb (79.4 kg)   SpO2 98%   BMI 33.07 kg/m   Physical Exam  Constitutional: She is oriented to person, place, and time and well-developed, well-nourished, and in no distress.  HENT:  Head: Normocephalic and atraumatic.  Right Ear: Hearing and external ear normal.  Left Ear: Hearing and external ear normal.  Eyes: Conjunctivae are normal.  Neck: Normal range of motion.  Pulmonary/Chest: Effort normal.  Neurological: She is alert and oriented to person, place, and time. Gait normal.  Skin: Skin is warm and dry.  drsg and packing removed- granulation tissue looks great - incision is about half healed -- no erythema - induration is decreased - no TTP  Psychiatric: Mood, memory, affect and judgment normal.  Vitals reviewed.   Assessment and Plan :  Cellulitis and abscess of trunk  MRSA infection   Continue to keep covered until the incision is healed over -- f/u  only if problems  Windell Hummingbird PA-C  [facility] 08/30/2015 3:34 PM

## 2015-09-10 ENCOUNTER — Encounter: Payer: Self-pay | Admitting: Family Medicine

## 2015-09-10 ENCOUNTER — Ambulatory Visit (INDEPENDENT_AMBULATORY_CARE_PROVIDER_SITE_OTHER): Payer: 59 | Admitting: Family Medicine

## 2015-09-10 VITALS — BP 110/62 | HR 76 | Temp 97.5°F | Resp 18 | Ht 61.0 in | Wt 179.0 lb

## 2015-09-10 DIAGNOSIS — R600 Localized edema: Secondary | ICD-10-CM

## 2015-09-10 DIAGNOSIS — I10 Essential (primary) hypertension: Secondary | ICD-10-CM

## 2015-09-10 DIAGNOSIS — L02211 Cutaneous abscess of abdominal wall: Secondary | ICD-10-CM

## 2015-09-10 DIAGNOSIS — B0229 Other postherpetic nervous system involvement: Secondary | ICD-10-CM | POA: Diagnosis not present

## 2015-09-10 MED ORDER — HYDROCODONE-ACETAMINOPHEN 5-325 MG PO TABS: 1.0000 | ORAL_TABLET | Freq: Four times a day (QID) | ORAL | 0 refills | Status: DC | PRN

## 2015-09-10 MED ORDER — DOXYCYCLINE HYCLATE 100 MG PO CAPS: 100.0000 mg | ORAL_CAPSULE | Freq: Two times a day (BID) | ORAL | 0 refills | Status: DC

## 2015-09-10 NOTE — Progress Notes (Signed)
Subjective:  By signing my name below, I, Moises Blood, attest that this documentation has been prepared under the direction and in the presence of Reginia Forts, MD. Electronically Signed: Moises Blood, [place]. 09/10/2015 , 13:58 .  Patient was seen in Room 3 .   Patient ID: Maureen Hodge, female    DOB: 29-Mar-1950, 65 y.o.   MRN: [id number]  09/10/2015  Follow-up (MRSA/shingles)   HPI Maureen Hodge is a 65 y.o. female who presents to [facility] for follow up of herpes zoster and abdominal wall MRSA abscess.  Has been doing well but has developed increasing pain along abscess site.  Denies fever/chills/sweats. S/p Doxycycline therapy. No further drainage.  Worried about recurrence. Has suffered with persistent leg swelling but has not been as active since suffering with shingles and abscess.  Still continues to suffer with L leg numbness/burning/tingling from Herpes Zoster infection; using Lidoderm patches; requesting refill of hydrocodone.    Review of Systems  Constitutional: Negative for chills, diaphoresis, fatigue and fever.  Eyes: Negative for visual disturbance.  Respiratory: Negative for cough and shortness of breath.   Cardiovascular: Positive for leg swelling. Negative for chest pain and palpitations.  Gastrointestinal: Negative for abdominal pain, constipation, diarrhea, nausea and vomiting.  Endocrine: Negative for cold intolerance, heat intolerance, polydipsia, polyphagia and polyuria.  Skin: Positive for color change, rash and wound.  Neurological: Negative for dizziness, tremors, seizures, syncope, facial asymmetry, speech difficulty, weakness, light-headedness, numbness and headaches.    Past Medical History:  Diagnosis Date  . Allergy   . Brain tumor ([place]) 05/2011   s/p resection  [facility] NS.  [name] Cerebral aneurysm, nonruptured   . Diabetes mellitus    Pre-diabetes  . Diffuse cystic mastopathy   . Esophageal reflux   . Hypertension   . Hypopotassemia  06/01/2007  . Intervertebral disc disorder with myelopathy, unspecified region   . Lupus erythematosus   . Migraines   . Other convulsions ([place])   . PLMD (periodic limb movement disorder)   . S/P bunionectomy 09/28/2013   [place].  Right.  [name] Unspecified sleep apnea    CPAP titrated to 7cm   Past Surgical History:  Procedure Laterality Date  . BRAIN AVM REPAIR    . BRAIN SURGERY  2013  . BREAST BIOPSY Bilateral    neg  . BREAST CYST ASPIRATION Bilateral   . BREAST SURGERY     x 5 due to fibrocystic disease  . DILATION AND CURETTAGE OF UTERUS     x 5  . FOOT SURGERY    . HEMORRHOID SURGERY    . HERNIA REPAIR    . lumbar surgery  2000  . SPINE SURGERY    . TONSILLECTOMY AND ADENOIDECTOMY    . TOTAL ABDOMINAL HYSTERECTOMY  1979   postpartum hemorrhage; ovaries intact.   Allergies  Allergen Reactions  . Lisinopril Swelling    Face and lips    Social History   Social History  . Marital status: Married    Spouse name: N/A  . Number of children: 2  . Years of education: N/A   Occupational History  . Retired    Social History Main Topics  . Smoking status: Never Smoker  . Smokeless tobacco: Never Used  . Alcohol use No  . Drug use: No  . Sexual activity: Yes   Other Topics Concern  . Not on file   Social History Narrative   Marital status:  Married  X 43 years, happily , no abuse.  Children:  2 children; 4 grandchildren.      Lives: with husband.      Employment: disability.  Works part-time at [employer]; working PRN.      Tobacco: never      Alcohol: wine once per month      Drugs: none     Exercise: sporadic in 2016; walking some.     Caffeine Use: Some.    Seatbelt:  Always uses seat belts.       Smoke alarm in the home.   Exercise: Some   Family History  Problem Relation Age of Onset  . Heart disease Mother     CHF  . Heart failure Mother     CHF  . Hypertension Mother   . Stroke Mother   . Cancer Mother 67    unknown primary  .  Hypertension Sister   . Crohn's disease Sister   . Arthritis Sister     partial knee replacement.  . Diabetes Daughter   . Coronary artery disease Other     family history  . Cancer Other     family history  . Breast cancer Paternal Aunt        Objective:    BP 110/62 (BP Location: Right Arm, Patient Position: Sitting, Cuff Size: Large)   Pulse 76   Temp 97.5 F (36.4 C) (Oral)   Resp 18   Ht 5\' 1"  (1.549 m)   Wt 179 lb (81.2 kg)   SpO2 100%   BMI 33.82 kg/m   Physical Exam  Constitutional: She is oriented to person, place, and time. She appears well-developed and well-nourished. No distress.  HENT:  Head: Normocephalic and atraumatic.  Right Ear: External ear normal.  Left Ear: External ear normal.  Nose: Nose normal.  Mouth/Throat: Oropharynx is clear and moist.  Eyes: Conjunctivae and EOM are normal. Pupils are equal, round, and reactive to light.  Neck: Normal range of motion. Neck supple. Carotid bruit is not present. No thyromegaly present.  Cardiovascular: Normal rate, regular rhythm, normal heart sounds and intact distal pulses.  Exam reveals no gallop and no friction rub.   No murmur heard. 1+ pitting edema L> RLE.  Pulmonary/Chest: Effort normal and breath sounds normal. She has no wheezes. She has no rales.  Abdominal: Soft. Bowel sounds are normal. She exhibits no distension and no mass. There is no tenderness. There is no rebound and no guarding.  Musculoskeletal: She exhibits edema.  Lymphadenopathy:    She has no cervical adenopathy.  Neurological: She is alert and oriented to person, place, and time. No cranial nerve deficit.  Skin: Skin is warm and dry. No rash noted. She is not diaphoretic. No erythema. No pallor.     Previous site of abscess/cellulitis with mild TTP; no erythema or fluctuance; +TTP.   Psychiatric: She has a normal mood and affect. Her behavior is normal.   Results for orders placed or performed in visit on 08/13/15  WOUND CULTURE   Result Value Ref Range   Culture Few METHICILLIN RESISTANT STAPHYLOCOCCUS AUREUS    Gram Stain Rare    Gram Stain WBC present-predominately PMN    Gram Stain No Squamous Epithelial Cells Seen    Gram Stain Rare GRAM POSITIVE COCCI IN PAIRS    Organism ID, Bacteria METHICILLIN RESISTANT STAPHYLOCOCCUS AUREUS       Susceptibility   Methicillin resistant staphylococcus aureus -  (no method available)    OXACILLIN >=4 Resistant     CEFAZOLIN  Resistant     GENTAMICIN <=0.5 Sensitive     CIPROFLOXACIN >=8 Resistant     LEVOFLOXACIN 4 Intermediate     TRIMETH/SULFA <=10 Sensitive     VANCOMYCIN <=0.5 Sensitive     CLINDAMYCIN <=0.25 Sensitive     ERYTHROMYCIN <=0.25 Sensitive     LINEZOLID 2 Sensitive     RIFAMPIN <=0.5 Sensitive     TETRACYCLINE <=1 Sensitive   Comprehensive metabolic panel  Result Value Ref Range   Sodium 140 135 - 146 mmol/L   Potassium 4.4 3.5 - 5.3 mmol/L   Chloride 103 98 - 110 mmol/L   CO2 25 20 - 31 mmol/L   Glucose, Bld 89 65 - 99 mg/dL   BUN 14 7 - 25 mg/dL   Creat 0.74 0.50 - 0.99 mg/dL   Total Bilirubin 0.4 0.2 - 1.2 mg/dL   Alkaline Phosphatase 66 33 - 130 U/L   AST 17 10 - 35 U/L   ALT 9 6 - 29 U/L   Total Protein 6.4 6.1 - 8.1 g/dL   Albumin 3.6 3.6 - 5.1 g/dL   Calcium 9.9 8.6 - 10.4 mg/dL  POCT CBC  Result Value Ref Range   WBC 13.9 (A) 4.6 - 10.2 K/uL   Lymph, poc 3.4 0.6 - 3.4   POC LYMPH PERCENT 24.7 10 - 50 %L   MID (cbc) 0.1 0 - 0.9   POC MID % 1.0 0 - 12 %M   POC Granulocyte 10.3 (A) 2 - 6.9   Granulocyte percent 74.3 37 - 80 %G   RBC 3.56 (A) 4.04 - 5.48 M/uL   Hemoglobin 11.6 (A) 12.2 - 16.2 g/dL   HCT, POC 34.0 (A) 37.7 - 47.9 %   MCV 95.7 80 - 97 fL   MCH, POC 32.8 (A) 27 - 31.2 pg   MCHC 34.3 31.8 - 35.4 g/dL   RDW, POC 13.9 %   Platelet Count, POC 391 142 - 424 K/uL   MPV 6.4 0 - 99.8 fL  POCT glucose (manual entry)  Result Value Ref Range   POC Glucose 95 70 - 99 mg/dl       Assessment & Plan:   1. Abscess of  abdominal wall   2. Postherpetic neuralgia   3. HYPERTENSION, BENIGN   4. Localized edema    -greatly improved abscess of abdominal wall yet with recent tenderness; rx for Doxy refill provided.  RTC for increasing pain, development of fever. -continue Lidoderm patches for pain for postherpetic neuralgia; refill of hydrocodone provided. -having some persistent lower extremity edema; anticipate improvement with increase in activity. -blood pressure well controlled.   No orders of the defined types were placed in this encounter.  Meds ordered this encounter  Medications  . doxycycline (VIBRAMYCIN) 100 MG capsule    Sig: Take 1 capsule (100 mg total) by mouth 2 (two) times daily.    Dispense:  20 capsule    Refill:  0  . HYDROcodone-acetaminophen (NORCO/VICODIN) 5-325 MG tablet    Sig: Take 1-2 tablets by mouth every 6 (six) hours as needed for moderate pain.    Dispense:  60 tablet    Refill:  0    No Follow-up on file.   I personally performed the services described in this documentation, which was scribed in my presence. The recorded information has been reviewed and considered.  Jacere Pangborn Elayne Guerin, M.D. [facility] [address] [place], [place]  [address] [phone] phone [phone] fax 09/10/2015 08:27pm

## 2015-09-10 NOTE — Patient Instructions (Signed)
     IF you received an x-ray today, you will receive an invoice from [facility]. Please contact [facility] at [phone] with questions or concerns regarding your invoice.   IF you received labwork today, you will receive an invoice from [facility]. Please contact [facility] at [phone] with questions or concerns regarding your invoice.   Our billing staff will not be able to assist you with questions regarding bills from these companies.  You will be contacted with the lab results as soon as they are available. The fastest way to get your results is to activate your My Chart account. Instructions are located on the last page of this paperwork. If you have not heard from us regarding the results in 2 weeks, please contact this office.      

## 2015-09-16 ENCOUNTER — Other Ambulatory Visit: Payer: Self-pay | Admitting: Family Medicine

## 2015-09-17 ENCOUNTER — Other Ambulatory Visit: Payer: Self-pay | Admitting: Family Medicine

## 2015-09-17 NOTE — Telephone Encounter (Signed)
Patient states that she out of her medication and needs a refill today if possible

## 2015-09-18 NOTE — Telephone Encounter (Signed)
Spoke to pt after pharm advised that pt said Dr Tamala Julian told her to take 2 tabs BID instead of the directions still on Rx. I looked all through most recent visits and could not find this change and it is still on med list for 2 tabs Qam and 1 tab Qhs. Called pt and she said she thought it was changed at last real check up w/Dr Tamala Julian 5/9. I see that pt was swelling and then on 6/6 ph message amlodipine was decreased but don't see instr's anywhere to DC the amlodipine and increase the atenolol. Sorry if I have missed it Dr Tamala Julian. I went ahead and RFd just x 1 mos at old sig per pt req, but will pend for what the pt states she should be on now, and changed to 50 mg tabs so pt only has to take 1 tab BID. Please review and send in new one if appropriate.

## 2015-09-22 ENCOUNTER — Encounter: Payer: Self-pay | Admitting: Family Medicine

## 2015-09-22 MED ORDER — ATENOLOL 50 MG PO TABS: 50.0000 mg | ORAL_TABLET | Freq: Two times a day (BID) | ORAL | 1 refills | Status: DC

## 2015-09-22 NOTE — Telephone Encounter (Signed)
Called to advise that new Rx was sent in and to make sure she was aware that it is the 50 mg so she should only take 1 tab BID. Pt understood and had already p/up.

## 2015-10-09 ENCOUNTER — Ambulatory Visit (INDEPENDENT_AMBULATORY_CARE_PROVIDER_SITE_OTHER): Payer: 59 | Admitting: Family Medicine

## 2015-10-09 VITALS — BP 124/78 | HR 80 | Temp 87.4°F | Resp 16 | Wt 181.4 lb

## 2015-10-09 DIAGNOSIS — R600 Localized edema: Secondary | ICD-10-CM

## 2015-10-09 DIAGNOSIS — L02211 Cutaneous abscess of abdominal wall: Secondary | ICD-10-CM | POA: Diagnosis not present

## 2015-10-09 DIAGNOSIS — B0229 Other postherpetic nervous system involvement: Secondary | ICD-10-CM

## 2015-10-09 LAB — COMPREHENSIVE METABOLIC PANEL
ALBUMIN: 3.6 g/dL (ref 3.6–5.1)
ALK PHOS: 50 U/L (ref 33–130)
ALT: 14 U/L (ref 6–29)
AST: 26 U/L (ref 10–35)
BILIRUBIN TOTAL: 0.6 mg/dL (ref 0.2–1.2)
BUN: 8 mg/dL (ref 7–25)
CALCIUM: 9.2 mg/dL (ref 8.6–10.4)
CO2: 28 mmol/L (ref 20–31)
CREATININE: 0.71 mg/dL (ref 0.50–0.99)
Chloride: 103 mmol/L (ref 98–110)
GLUCOSE: 94 mg/dL (ref 65–99)
Potassium: 3.1 mmol/L — ABNORMAL LOW (ref 3.5–5.3)
SODIUM: 141 mmol/L (ref 135–146)
Total Protein: 5.9 g/dL — ABNORMAL LOW (ref 6.1–8.1)

## 2015-10-09 LAB — POCT CBC
Granulocyte percent: 39.5 %G (ref 37–80)
HCT, POC: 33.6 % — AB (ref 37.7–47.9)
Hemoglobin: 11.7 g/dL — AB (ref 12.2–16.2)
Lymph, poc: 3.8 — AB (ref 0.6–3.4)
MCH, POC: 31.9 pg — AB (ref 27–31.2)
MCHC: 35 g/dL (ref 31.8–35.4)
MCV: 91.2 fL (ref 80–97)
MID (CBC): 0.5 (ref 0–0.9)
MPV: 7.4 fL (ref 0–99.8)
PLATELET COUNT, POC: 228 10*3/uL (ref 142–424)
POC Granulocyte: 2.8 (ref 2–6.9)
POC LYMPH %: 53.1 % — AB (ref 10–50)
POC MID %: 7.4 %M (ref 0–12)
RBC: 3.69 M/uL — AB (ref 4.04–5.48)
RDW, POC: 13.7 %
WBC: 7.1 10*3/uL (ref 4.6–10.2)

## 2015-10-09 LAB — POCT URINALYSIS DIP (MANUAL ENTRY)
BILIRUBIN UA: NEGATIVE
BILIRUBIN UA: NEGATIVE
Glucose, UA: NEGATIVE
Leukocytes, UA: NEGATIVE
Nitrite, UA: NEGATIVE
PH UA: 6.5
PROTEIN UA: NEGATIVE
RBC UA: NEGATIVE
Urobilinogen, UA: 1

## 2015-10-09 MED ORDER — ATENOLOL 50 MG PO TABS: 50.0000 mg | ORAL_TABLET | Freq: Two times a day (BID) | ORAL | 1 refills | Status: DC

## 2015-10-09 MED ORDER — FUROSEMIDE 40 MG PO TABS: 40.0000 mg | ORAL_TABLET | Freq: Every day | ORAL | 1 refills | Status: DC

## 2015-10-09 MED ORDER — HYDROCODONE-ACETAMINOPHEN 5-325 MG PO TABS: 1.0000 | ORAL_TABLET | Freq: Four times a day (QID) | ORAL | 0 refills | Status: DC | PRN

## 2015-10-09 NOTE — Progress Notes (Signed)
Subjective:    Patient ID: Maureen Hodge, female    DOB: October 30, 1950, 65 y.o.   MRN: [id number]  10/09/2015  Follow-up (shingles side/groin area and bilat swollen ankles since shingles started no SOB)   HPI This 65 y.o. female presents for evaluation of the following:  1.  Chills and sweats: onset two weeks ago.  Never cold.  Worried about persistent abscess.  No sore throat; no rhinorrhea; no nasal congestion.  No n/v/d.  No SOB.   No dysuria, frequency, urgency. Lower abdomen is sore; might be paranoid.  Finished all abx after 09/10/15.    2.  Leg swelling B:  Chronic; worsening since last visit; worsened in last two weeks.  No chest pain, DOE/orthopnea.    3.  Postherpetic neuralgia:  Ran out of pain meidcation two weeks ago; Ibuprofen 200mg  four tablets every 4 hours.  Severity 0.  Much better. Still applying Lidoderm patch or every other day.  Has taken ibuprofen as preventative.     Review of Systems  Constitutional: Positive for chills and diaphoresis. Negative for fatigue and fever.  HENT: Negative for congestion, ear pain, postnasal drip, rhinorrhea, sneezing and sore throat.   Respiratory: Negative for cough and shortness of breath.   Cardiovascular: Positive for leg swelling. Negative for chest pain and palpitations.  Gastrointestinal: Negative for abdominal pain, diarrhea, nausea and vomiting.  Genitourinary: Negative for dysuria, flank pain, frequency, hematuria and urgency.  Musculoskeletal: Positive for back pain and myalgias.  Skin: Negative for color change, pallor, rash and wound.    Past Medical History:  Diagnosis Date  . Allergy   . Brain tumor ([place]) 05/2011   s/p resection  [facility].  [name] Cerebral aneurysm, nonruptured   . Diabetes mellitus    Pre-diabetes  . Diffuse cystic mastopathy   . Esophageal reflux   . Hypertension   . Hypopotassemia 06/01/2007  . Intervertebral disc disorder with myelopathy, unspecified region   . Lupus erythematosus   .  Migraines   . Other convulsions   . PLMD (periodic limb movement disorder)   . S/P bunionectomy 09/28/2013   [facility].  Right.  [name] Unspecified sleep apnea    CPAP titrated to 7cm   Past Surgical History:  Procedure Laterality Date  . BRAIN AVM REPAIR    . BRAIN SURGERY  2013  . BREAST BIOPSY Bilateral    neg  . BREAST CYST ASPIRATION Bilateral   . BREAST SURGERY     x 5 due to fibrocystic disease  . DILATION AND CURETTAGE OF UTERUS     x 5  . FOOT SURGERY    . HEMORRHOID SURGERY    . HERNIA REPAIR    . lumbar surgery  2000  . SPINE SURGERY    . TONSILLECTOMY AND ADENOIDECTOMY    . TOTAL ABDOMINAL HYSTERECTOMY  1979   postpartum hemorrhage; ovaries intact.   Allergies  Allergen Reactions  . Lisinopril Swelling    Face and lips   Current Outpatient Prescriptions  Medication Sig Dispense Refill  . albuterol (PROVENTIL HFA;VENTOLIN HFA) 108 (90 BASE) MCG/ACT inhaler Inhale 2 puffs into the lungs every 6 (six) hours as needed for wheezing or shortness of breath (cough, shortness of breath or wheezing.). 1 Inhaler 0  . atenolol (TENORMIN) 50 MG tablet Take 1 tablet (50 mg total) by mouth 2 (two) times daily. 180 tablet 1  . Ciclopirox 1 % shampoo LATHER TO SCALP 1-2 TIMES A WEEK. ALLOW TO SIT 5 MINUTES BEFORE RINSING.  1  . clobetasol cream (TEMOVATE) 0.05 % APPLY A THIN LAYER TO THE AFFECTED AREAS TWICE DAILY AS NEEDED FOR A MAX OF 4 WEEKS PER FLARE  1  . clotrimazole (LOTRIMIN) 1 % cream Apply 1 application topically 2 (two) times daily. 30 g 0  . furosemide (LASIX) 40 MG tablet Take 1 tablet (40 mg total) by mouth daily. 90 tablet 1  . gabapentin (NEURONTIN) 800 MG tablet TAKE 1 TABLET (800 MG TOTAL) BY MOUTH 4 (FOUR) TIMES DAILY. 360 tablet 1  . HYDROcodone-acetaminophen (NORCO/VICODIN) 5-325 MG tablet Take 1-2 tablets by mouth every 6 (six) hours as needed for moderate pain. 60 tablet 0  . hydroxychloroquine (PLAQUENIL) 200 MG tablet Take 200 mg by mouth 2 (two) times daily.        . hydrOXYzine (ATARAX/VISTARIL) 10 MG tablet TAKE 1-2 TABS AT DINNER AND/OR BEDTIME AS NEEDED FOR ITCHING. WATCH FOR SEDATION.  2  . lidocaine (LIDODERM) 5 % Place 3 patches onto the skin daily. Remove & Discard patch within 12 hours or as directed by MD 90 patch 3  . mometasone (ELOCON) 0.1 % ointment Apply topically Twice daily as needed.    . nitroGLYCERIN (NITROSTAT) 0.4 MG SL tablet Place 1 tablet (0.4 mg total) under the tongue every 5 (five) minutes as needed for chest pain. 30 tablet 0  . nortriptyline (PAMELOR) 25 MG capsule Take 1 capsule (25 mg total) by mouth 3 (three) times daily. 90 capsule 3  . pantoprazole (PROTONIX) 40 MG tablet TAKE 1 TABLET (40 MG TOTAL) BY MOUTH DAILY. 90 tablet 0  . potassium chloride SA (KLOR-CON M20) 20 MEQ tablet Take 2 tablets (40 mEq total) by mouth 2 (two) times daily. 360 tablet 3  . triamterene-hydrochlorothiazide (MAXZIDE-25) 37.5-25 MG tablet TAKE 1 TABLET BY MOUTH DAILY. 30 tablet 3  . alendronate (FOSAMAX) 70 MG tablet Take 70 mg by mouth once a week.  0  . cetirizine (ZYRTEC) 10 MG tablet Take 10 mg by mouth daily.    [name] estradiol (ESTRACE) 2 MG tablet TAKE 1 TABLET (2 MG TOTAL) BY MOUTH DAILY. (Patient not taking: Reported on 10/09/2015) 90 tablet 0  . fexofenadine (ALLEGRA) 180 MG tablet Take 180 mg by mouth daily.    . mycophenolate (CELLCEPT) 500 MG tablet     . ondansetron (ZOFRAN-ODT) 8 MG disintegrating tablet Take 1 tablet (8 mg total) by mouth every 8 (eight) hours as needed for nausea. (Patient not taking: Reported on 10/09/2015) 20 tablet 0  . valACYclovir (VALTREX) 1000 MG tablet Take 1 tablet (1,000 mg total) by mouth 3 (three) times daily. (Patient not taking: Reported on 10/09/2015) 21 tablet 0  . zoster vaccine live, PF, (ZOSTAVAX) 16109 UNT/0.65ML injection Inject 19,400 Units into the skin once. 0.65 mL 0   No current facility-administered medications for this visit.    Social History   Social History  . Marital status: Married     Spouse name: N/A  . Number of children: 2  . Years of education: N/A   Occupational History  . Retired    Social History Main Topics  . Smoking status: Never Smoker  . Smokeless tobacco: Never Used  . Alcohol use No  . Drug use: No  . Sexual activity: Yes   Other Topics Concern  . Not on file   Social History Narrative   Marital status:  Married  X 43 years, happily , no abuse.       Children:  2 children; 4 grandchildren.  Lives: with husband.      Employment: disability.  Works part-time at [employer]; working PRN.      Tobacco: never      Alcohol: wine once per month      Drugs: none     Exercise: sporadic in 2016; walking some.     Caffeine Use: Some.    Seatbelt:  Always uses seat belts.       Smoke alarm in the home.   Exercise: Some   Family History  Problem Relation Age of Onset  . Heart disease Mother     CHF  . Heart failure Mother     CHF  . Hypertension Mother   . Stroke Mother   . Cancer Mother 42    unknown primary  . Hypertension Sister   . Crohn's disease Sister   . Arthritis Sister     partial knee replacement.  . Diabetes Daughter   . Coronary artery disease Other     family history  . Cancer Other     family history  . Breast cancer Paternal Aunt        Objective:    BP 124/78 (BP Location: Right Arm, Cuff Size: Normal)   Pulse 80   Temp (!) 87.4 F (30.8 C) (Oral)   Resp 16   Wt 181 lb 6.4 oz (82.3 kg)   SpO2 98%   BMI 34.28 kg/m  Physical Exam  Constitutional: She is oriented to person, place, and time. She appears well-developed and well-nourished. No distress.  HENT:  Head: Normocephalic and atraumatic.  Right Ear: External ear normal.  Left Ear: External ear normal.  Nose: Nose normal.  Mouth/Throat: Oropharynx is clear and moist.  Eyes: Conjunctivae and EOM are normal. Pupils are equal, round, and reactive to light.  Neck: Normal range of motion. Neck supple. Carotid bruit is not present. No thyromegaly  present.  Cardiovascular: Normal rate, regular rhythm, normal heart sounds and intact distal pulses.  Exam reveals no gallop and no friction rub.   No murmur heard. Pulmonary/Chest: Effort normal and breath sounds normal. She has no wheezes. She has no rales.  Abdominal: Soft. Bowel sounds are normal. She exhibits no distension and no mass. There is no tenderness. There is no rebound and no guarding.  Lymphadenopathy:    She has no cervical adenopathy.  Neurological: She is alert and oriented to person, place, and time. No cranial nerve deficit.  Skin: Skin is warm and dry. No rash noted. She is not diaphoretic. No erythema. No pallor.  Well healing cellulitis/abscess site at L suprapubic region.  Non-tender.  +scar tissue under incision; no warmth, erythema, or TTP.  Psychiatric: She has a normal mood and affect. Her behavior is normal.  Nursing note and vitals reviewed.  Results for orders placed or performed in visit on 10/09/15  POCT urinalysis dipstick  Result Value Ref Range   Color, UA yellow yellow   Clarity, UA clear clear   Glucose, UA negative negative   Bilirubin, UA negative negative   Ketones, POC UA negative negative   Spec Grav, UA >=1.030    Blood, UA negative negative   pH, UA 6.5    Protein Ur, POC negative negative   Urobilinogen, UA 1.0    Nitrite, UA Negative Negative   Leukocytes, UA Negative Negative  POCT CBC  Result Value Ref Range   WBC 7.1 4.6 - 10.2 K/uL   Lymph, poc 3.8 (A) 0.6 - 3.4   POC LYMPH  PERCENT 53.1 (A) 10 - 50 %L   MID (cbc) 0.5 0 - 0.9   POC MID % 7.4 0 - 12 %M   POC Granulocyte 2.8 2 - 6.9   Granulocyte percent 39.5 37 - 80 %G   RBC 3.69 (A) 4.04 - 5.48 M/uL   Hemoglobin 11.7 (A) 12.2 - 16.2 g/dL   HCT, POC 33.6 (A) 37.7 - 47.9 %   MCV 91.2 80 - 97 fL   MCH, POC 31.9 (A) 27 - 31.2 pg   MCHC 35.0 31.8 - 35.4 g/dL   RDW, POC 13.7 %   Platelet Count, POC 228 142 - 424 K/uL   MPV 7.4 0 - 99.8 fL       Assessment & Plan:   1.  Abdominal wall abscess   2. Localized edema   3. Postherpetic neuralgia    -improved/resolved abscess; no evidence of recurrence.  Normal WBC.  Further abx not indicated.  -edema has worsened in past two weeks with increased Ibuprofen use; no systemic symptoms; stop Ibuprofen; start Lasix.  --postherpetic neuralgia has improved; continue lidoderm patches; refill of hydrocodone provided; avoid further ibuprofen with edema.   Orders Placed This Encounter  Procedures  . Comprehensive metabolic panel  . POCT urinalysis dipstick  . POCT CBC   Meds ordered this encounter  Medications  . HYDROcodone-acetaminophen (NORCO/VICODIN) 5-325 MG tablet    Sig: Take 1-2 tablets by mouth every 6 (six) hours as needed for moderate pain.    Dispense:  60 tablet    Refill:  0  . furosemide (LASIX) 40 MG tablet    Sig: Take 1 tablet (40 mg total) by mouth daily.    Dispense:  90 tablet    Refill:  1  . atenolol (TENORMIN) 50 MG tablet    Sig: Take 1 tablet (50 mg total) by mouth 2 (two) times daily.    Dispense:  180 tablet    Refill:  1    No Follow-up on file.   Maikayla Beggs Elayne Guerin, M.D. [facility] [address] [place], [place]  [address] [phone] phone [phone] fax

## 2015-10-09 NOTE — Patient Instructions (Signed)
1.  Stop Ibuprofen. 2.  Start taking Lasix/furosemide 40mg  one tablet daily for the next 1-2 weeks and then take as needed.      IF you received an x-ray today, you will receive an invoice from [facility]. Please contact [facility] at [phone] with questions or concerns regarding your invoice.   IF you received labwork today, you will receive an invoice from [facility] [employer]. Please contact [facility] at [phone] with questions or concerns regarding your invoice.   Our billing staff will not be able to assist you with questions regarding bills from these companies.  You will be contacted with the lab results as soon as they are available. The fastest way to get your results is to activate your My Chart account. Instructions are located on the last page of this paperwork. If you have not heard from [place] regarding the results in 2 weeks, please contact this office.

## 2015-10-22 ENCOUNTER — Other Ambulatory Visit: Payer: Self-pay

## 2015-10-22 DIAGNOSIS — I10 Essential (primary) hypertension: Secondary | ICD-10-CM

## 2015-10-22 NOTE — Telephone Encounter (Signed)
09/2015 last ov and lab Next visit 11/05/2015  See k+ level from 10/09/15  3.1

## 2015-10-24 ENCOUNTER — Telehealth: Payer: Self-pay

## 2015-10-24 MED ORDER — TRIAMTERENE-HCTZ 37.5-25 MG PO TABS: ORAL_TABLET | ORAL | 3 refills | Status: DC

## 2015-10-24 NOTE — Telephone Encounter (Signed)
[facility] is requesting a 90 day supply when Triamterene HCTZ 37.5/25 is refilled.

## 2015-10-24 NOTE — Telephone Encounter (Signed)
Pt is checking on the status of a refill request of her blood pressure meds she is completely out now   [employer] [phone]

## 2015-10-31 ENCOUNTER — Other Ambulatory Visit: Payer: Self-pay | Admitting: Family Medicine

## 2015-11-03 ENCOUNTER — Ambulatory Visit: Admission: RE | Admit: 2015-11-03 | Payer: Medicare Other | Source: Ambulatory Visit | Admitting: Gastroenterology

## 2015-11-03 ENCOUNTER — Encounter: Admission: RE | Payer: Self-pay | Source: Ambulatory Visit

## 2015-11-03 SURGERY — COLONOSCOPY WITH PROPOFOL
Anesthesia: General

## 2015-11-05 ENCOUNTER — Ambulatory Visit (INDEPENDENT_AMBULATORY_CARE_PROVIDER_SITE_OTHER): Payer: 59 | Admitting: Family Medicine

## 2015-11-05 ENCOUNTER — Encounter: Payer: Self-pay | Admitting: Family Medicine

## 2015-11-05 VITALS — BP 126/82 | HR 66 | Temp 97.6°F | Resp 18 | Ht 61.0 in | Wt 177.0 lb

## 2015-11-05 DIAGNOSIS — G4733 Obstructive sleep apnea (adult) (pediatric): Secondary | ICD-10-CM | POA: Diagnosis not present

## 2015-11-05 DIAGNOSIS — I10 Essential (primary) hypertension: Secondary | ICD-10-CM

## 2015-11-05 DIAGNOSIS — K219 Gastro-esophageal reflux disease without esophagitis: Secondary | ICD-10-CM | POA: Diagnosis not present

## 2015-11-05 DIAGNOSIS — F5102 Adjustment insomnia: Secondary | ICD-10-CM | POA: Diagnosis not present

## 2015-11-05 DIAGNOSIS — B0229 Other postherpetic nervous system involvement: Secondary | ICD-10-CM

## 2015-11-05 DIAGNOSIS — Z9989 Dependence on other enabling machines and devices: Secondary | ICD-10-CM | POA: Diagnosis not present

## 2015-11-05 DIAGNOSIS — Z23 Encounter for immunization: Secondary | ICD-10-CM

## 2015-11-05 DIAGNOSIS — M7062 Trochanteric bursitis, left hip: Secondary | ICD-10-CM

## 2015-11-05 DIAGNOSIS — R7302 Impaired glucose tolerance (oral): Secondary | ICD-10-CM | POA: Diagnosis not present

## 2015-11-05 LAB — CBC WITH DIFFERENTIAL/PLATELET
Basophils Absolute: 0 cells/uL (ref 0–200)
Basophils Relative: 0 %
EOS PCT: 1 %
Eosinophils Absolute: 61 cells/uL (ref 15–500)
HEMATOCRIT: 37.2 % (ref 35.0–45.0)
Hemoglobin: 12.4 g/dL (ref 11.7–15.5)
LYMPHS PCT: 54 %
Lymphs Abs: 3294 cells/uL (ref 850–3900)
MCH: 29.9 pg (ref 27.0–33.0)
MCHC: 33.3 g/dL (ref 32.0–36.0)
MCV: 89.6 fL (ref 80.0–100.0)
MPV: 9.8 fL (ref 7.5–12.5)
Monocytes Absolute: 366 cells/uL (ref 200–950)
Monocytes Relative: 6 %
NEUTROS PCT: 39 %
Neutro Abs: 2379 cells/uL (ref 1500–7800)
Platelets: 248 10*3/uL (ref 140–400)
RBC: 4.15 MIL/uL (ref 3.80–5.10)
RDW: 13.2 % (ref 11.0–15.0)
WBC: 6.1 10*3/uL (ref 3.8–10.8)

## 2015-11-05 LAB — COMPREHENSIVE METABOLIC PANEL
ALT: 11 U/L (ref 6–29)
AST: 20 U/L (ref 10–35)
Albumin: 3.9 g/dL (ref 3.6–5.1)
Alkaline Phosphatase: 61 U/L (ref 33–130)
BILIRUBIN TOTAL: 0.5 mg/dL (ref 0.2–1.2)
BUN: 10 mg/dL (ref 7–25)
CALCIUM: 9.6 mg/dL (ref 8.6–10.4)
CO2: 25 mmol/L (ref 20–31)
Chloride: 106 mmol/L (ref 98–110)
Creat: 0.71 mg/dL (ref 0.50–0.99)
GLUCOSE: 101 mg/dL — AB (ref 65–99)
Potassium: 3.8 mmol/L (ref 3.5–5.3)
Sodium: 142 mmol/L (ref 135–146)
Total Protein: 6.5 g/dL (ref 6.1–8.1)

## 2015-11-05 LAB — MAGNESIUM: Magnesium: 1.5 mg/dL (ref 1.5–2.5)

## 2015-11-05 MED ORDER — HYDROXYZINE HCL 25 MG PO TABS: 12.5000 mg | ORAL_TABLET | Freq: Every evening | ORAL | 2 refills | Status: DC | PRN

## 2015-11-05 MED ORDER — HYDROCODONE-ACETAMINOPHEN 5-325 MG PO TABS: 1.0000 | ORAL_TABLET | Freq: Four times a day (QID) | ORAL | 0 refills | Status: DC | PRN

## 2015-11-05 NOTE — Patient Instructions (Addendum)
IF you received an x-ray today, you will receive an invoice from [facility]. Please contact [facility] at [phone] with questions or concerns regarding your invoice.   IF you received labwork today, you will receive an invoice from [facility]. Please contact [facility] at [phone] with questions or concerns regarding your invoice.   Our billing staff will not be able to assist you with questions regarding bills from these companies.  You will be contacted with the lab results as soon as they are available. The fastest way to get your results is to activate your My Chart account. Instructions are located on the last page of this paperwork. If you have not heard from [place] regarding the results in 2 weeks, please contact this office.     Trochanteric Bursitis You have hip pain due to trochanteric bursitis. Bursitis means that the sack near the outside of the hip is filled with fluid and inflamed. This sack is made up of protective soft tissue. The pain from trochanteric bursitis can be severe and keep you from sleep. It can radiate to the buttocks or down the outside of the thigh to the knee. The pain is almost always worse when rising from the seated or lying position and with walking. Pain can improve after you take a few steps. It happens more often in people with hip joint and lumbar spine problems, such as arthritis or previous surgery. Very rarely the trochanteric bursa can become infected, and antibiotics and/or surgery may be needed. Treatment often includes an injection of local anesthetic mixed with cortisone medicine. This medicine is injected into the area where it is most tender over the hip. Repeat injections may be necessary if the response to treatment is slow. You can apply ice packs over the tender area for 30 minutes every 2 hours for the next few days. Anti-inflammatory and/or narcotic pain medicine may also be helpful. Limit  your activity for the next few days if the pain continues. See your caregiver in 5-10 days if you are not greatly improved.  SEEK IMMEDIATE MEDICAL CARE IF:  You develop severe pain, fever, or increased redness.  You have pain that radiates below the knee. EXERCISES STRETCHING EXERCISES - Trochanteric Bursitis  These exercises may help you when beginning to rehabilitate your injury. Your symptoms may resolve with or without further involvement from your physician, physical therapist, or athletic trainer. While completing these exercises, remember:   Restoring tissue flexibility helps normal motion to return to the joints. This allows healthier, less painful movement and activity.  An effective stretch should be held for at least 30 seconds.  A stretch should never be painful. You should only feel a gentle lengthening or release in the stretched tissue. STRETCH - Iliotibial Band  On the floor or bed, lie on your side so your injured leg is on top. Bend your knee and grab your ankle.  Slowly bring your knee back so that your thigh is in line with your trunk. Keep your heel at your buttocks and gently arch your back so your head, shoulders and hips line up.  Slowly lower your leg so that your knee approaches the floor/bed until you feel a gentle stretch on the outside of your thigh. If you do not feel a stretch and your knee will not fall farther, place the heel of your opposite foot on top of your knee and pull your thigh down farther.  Hold this stretch for __________ seconds.  Repeat __________ times. Complete this exercise __________ times per day. STRETCH - Hamstrings, Supine   Lie on your back. Loop a belt or towel over the ball of your foot as shown.  Straighten your knee and slowly pull on the belt to raise your injured leg. Do not allow the knee to bend. Keep your opposite leg flat on the floor.  Raise the leg until you feel a gentle stretch behind your knee or thigh. Hold this  position for __________ seconds.  Repeat __________ times. Complete this stretch __________ times per day. STRETCH - Quadriceps, Prone   Lie on your stomach on a firm surface, such as a bed or padded floor.  Bend your knee and grasp your ankle. If you are unable to reach your ankle or pant leg, use a belt around your foot to lengthen your reach.  Gently pull your heel toward your buttocks. Your knee should not slide out to the side. You should feel a stretch in the front of your thigh and/or knee.  Hold this position for __________ seconds.  Repeat __________ times. Complete this stretch __________ times per day. STRETCHING - Hip Flexors, Lunge Half kneel with your knee on the floor and your opposite knee bent and directly over your ankle.  Keep good posture with your head over your shoulders. Tighten your buttocks to point your tailbone downward; this will prevent your back from arching too much.  You should feel a gentle stretch in the front of your thigh and/or hip. If you do not feel any resistance, slightly slide your opposite foot forward and then slowly lunge forward so your knee once again lines up over your ankle. Be sure your tailbone remains pointed downward.  Hold this stretch for __________ seconds.  Repeat __________ times. Complete this stretch __________ times per day. STRETCH - Adductors, Lunge  While standing, spread your legs.  Lean away from your injured leg by bending your opposite knee. You may rest your hands on your thigh for balance.  You should feel a stretch in your inner thigh. Hold for __________ seconds.  Repeat __________ times. Complete this exercise __________ times per day.   This information is not intended to replace advice given to you by your health care provider. Make sure you discuss any questions you have with your health care provider.   Document Released: 01/29/2004 Document Revised: 05/07/2014 Document Reviewed: 04/04/2008 Elsevier  Interactive Patient Education [employer].

## 2015-11-05 NOTE — Progress Notes (Addendum)
Subjective:    Patient ID: Maureen Hodge, female    DOB: 1950/07/01, 65 y.o.   MRN: [id number]  11/05/2015  Follow-up (SHINGLES/MEDICATIONS/POSS FLU)   HPI This 65 y.o. female presents for hypertension, glucose intolerance, GERD follow-up.     Itching some: just started itching again.  Itching on L side where shingles were located.    Insomnia: trouble sleeping lately; started during shingles. Prednisone does this; lingering now.   With turning over, L hip hurts when lays on hip. Cannot lay on L hip at night; no pain during the day.  Onset two months ago.   Shingles: still wearing lidoderm patch; some days not wearing.    OSA with CPAP: needs replacement face mask.  Patient reports good compliance with medication, good tolerance to medication, and good symptom control.  Received new CPAP device in July 2017.  Received compliance report for CPAP from [facility].  For the dates 07/09/2015-08/07/2015, patient has used CPAP device 124 of 150 days which is 83% of time.  Patient uses CPAP device more than four hours per night 94 of the 150 days which is 63% of the time.  Average daily use is 4 hours 14 minutes.  See report for further details.  Patient reports no snoring when using CPAP; pt reports decreased daytime sleepiness.    HTN: Patient reports good compliance with medication, good tolerance to medication, and good symptom control.    BP Readings from Last 3 Encounters:  11/05/15 126/82  10/09/15 124/78  09/10/15 110/62   Wt Readings from Last 3 Encounters:  11/05/15 177 lb (80.3 kg)  10/09/15 181 lb 6.4 oz (82.3 kg)  09/10/15 179 lb (81.2 kg)    Review of Systems  Constitutional: Negative for chills, diaphoresis, fatigue and fever.  Eyes: Negative for visual disturbance.  Respiratory: Negative for cough and shortness of breath.   Cardiovascular: Negative for chest pain, palpitations and leg swelling.  Gastrointestinal: Negative for abdominal pain, constipation,  diarrhea, nausea and vomiting.  Endocrine: Negative for cold intolerance, heat intolerance, polydipsia, polyphagia and polyuria.  Musculoskeletal: Positive for arthralgias.  Skin: Negative for color change, pallor, rash and wound.  Neurological: Negative for dizziness, tremors, seizures, syncope, facial asymmetry, speech difficulty, weakness, light-headedness, numbness and headaches.  Psychiatric/Behavioral: Positive for sleep disturbance.    Past Medical History:  Diagnosis Date  . Allergy   . Brain tumor ([place]) 05/2011   s/p resection  [facility].  [name] Cerebral aneurysm, nonruptured   . Diabetes mellitus    Pre-diabetes  . Diffuse cystic mastopathy   . Esophageal reflux   . Hypertension   . Hypopotassemia 06/01/2007  . Intervertebral disc disorder with myelopathy, unspecified region   . Lupus erythematosus   . Migraines   . Other convulsions   . PLMD (periodic limb movement disorder)   . S/P bunionectomy 09/28/2013   [facility].  Right.  [name] Unspecified sleep apnea    CPAP titrated to 7cm   Past Surgical History:  Procedure Laterality Date  . BRAIN AVM REPAIR    . BRAIN SURGERY  2013  . BREAST BIOPSY Bilateral    neg  . BREAST CYST ASPIRATION Bilateral   . BREAST SURGERY     x 5 due to fibrocystic disease  . DILATION AND CURETTAGE OF UTERUS     x 5  . FOOT SURGERY    . HEMORRHOID SURGERY    . HERNIA REPAIR    . lumbar surgery  2000  . SPINE  SURGERY    . TONSILLECTOMY AND ADENOIDECTOMY    . TOTAL ABDOMINAL HYSTERECTOMY  1979   postpartum hemorrhage; ovaries intact.   Allergies  Allergen Reactions  . Lisinopril Swelling    Face and lips   Current Outpatient Prescriptions  Medication Sig Dispense Refill  . albuterol (PROVENTIL HFA;VENTOLIN HFA) 108 (90 BASE) MCG/ACT inhaler Inhale 2 puffs into the lungs every 6 (six) hours as needed for wheezing or shortness of breath (cough, shortness of breath or wheezing.). 1 Inhaler 0  . atenolol (TENORMIN) 50 MG tablet Take 1  tablet (50 mg total) by mouth 2 (two) times daily. 180 tablet 1  . cetirizine (ZYRTEC) 10 MG tablet Take 10 mg by mouth daily.    . Ciclopirox 1 % shampoo LATHER TO SCALP 1-2 TIMES A WEEK. ALLOW TO SIT 5 MINUTES BEFORE RINSING.  1  . clobetasol cream (TEMOVATE) 0.05 % APPLY A THIN LAYER TO THE AFFECTED AREAS TWICE DAILY AS NEEDED FOR A MAX OF 4 WEEKS PER FLARE  1  . clotrimazole (LOTRIMIN) 1 % cream Apply 1 application topically 2 (two) times daily. 30 g 0  . fexofenadine (ALLEGRA) 180 MG tablet Take 180 mg by mouth daily.    . furosemide (LASIX) 40 MG tablet Take 1 tablet (40 mg total) by mouth daily. 90 tablet 1  . gabapentin (NEURONTIN) 800 MG tablet TAKE 1 TABLET (800 MG TOTAL) BY MOUTH 4 (FOUR) TIMES DAILY. 360 tablet 1  . HYDROcodone-acetaminophen (NORCO/VICODIN) 5-325 MG tablet Take 1-2 tablets by mouth every 6 (six) hours as needed for moderate pain. 60 tablet 0  . hydroxychloroquine (PLAQUENIL) 200 MG tablet Take 200 mg by mouth 2 (two) times daily.      [name] lidocaine (LIDODERM) 5 % Place 3 patches onto the skin daily. Remove & Discard patch within 12 hours or as directed by MD 90 patch 3  . mometasone (ELOCON) 0.1 % ointment Apply topically Twice daily as needed.    . nitroGLYCERIN (NITROSTAT) 0.4 MG SL tablet Place 1 tablet (0.4 mg total) under the tongue every 5 (five) minutes as needed for chest pain. 30 tablet 0  . ondansetron (ZOFRAN-ODT) 8 MG disintegrating tablet Take 1 tablet (8 mg total) by mouth every 8 (eight) hours as needed for nausea. 20 tablet 0  . potassium chloride SA (KLOR-CON M20) 20 MEQ tablet Take 2 tablets (40 mEq total) by mouth 2 (two) times daily. 360 tablet 3  . triamterene-hydrochlorothiazide (MAXZIDE-25) 37.5-25 MG tablet TAKE 1 TABLET BY MOUTH DAILY. 90 tablet 3  . zoster vaccine live, PF, (ZOSTAVAX) 93818 UNT/0.65ML injection Inject 19,400 Units into the skin once. 0.65 mL 0  . alendronate (FOSAMAX) 70 MG tablet Take 70 mg by mouth once a week.  0  . estradiol  (ESTRACE) 2 MG tablet TAKE 1 TABLET (2 MG TOTAL) BY MOUTH DAILY. (Patient not taking: Reported on 11/05/2015) 90 tablet 0  . hydrOXYzine (ATARAX/VISTARIL) 25 MG tablet Take 0.5-1 tablets (12.5-25 mg total) by mouth at bedtime as needed (insomnia). 30 tablet 2  . mycophenolate (CELLCEPT) 500 MG tablet     . nortriptyline (PAMELOR) 25 MG capsule Take 1 capsule (25 mg total) by mouth 3 (three) times daily. 270 capsule 3  . pantoprazole (PROTONIX) 40 MG tablet TAKE 1 TABLET (40 MG TOTAL) BY MOUTH DAILY. 90 tablet 3  . valACYclovir (VALTREX) 1000 MG tablet Take 1 tablet (1,000 mg total) by mouth 3 (three) times daily. (Patient not taking: Reported on 11/05/2015) 21 tablet 0  No current facility-administered medications for this visit.    Social History   Social History  . Marital status: Married    Spouse name: N/A  . Number of children: 2  . Years of education: N/A   Occupational History  . Retired    Social History Main Topics  . Smoking status: Never Smoker  . Smokeless tobacco: Never Used  . Alcohol use No  . Drug use: No  . Sexual activity: Yes   Other Topics Concern  . Not on file   Social History Narrative   Marital status:  Married  X 43 years, happily , no abuse.       Children:  2 children; 4 grandchildren.      Lives: with husband.      Employment: disability.  Works part-time at [employer]; working PRN.      Tobacco: never      Alcohol: wine once per month      Drugs: none     Exercise: sporadic in 2016; walking some.     Caffeine Use: Some.    Seatbelt:  Always uses seat belts.       Smoke alarm in the home.   Exercise: Some   Family History  Problem Relation Age of Onset  . Heart disease Mother     CHF  . Heart failure Mother     CHF  . Hypertension Mother   . Stroke Mother   . Cancer Mother 10    unknown primary  . Hypertension Sister   . Crohn's disease Sister   . Arthritis Sister     partial knee replacement.  . Diabetes Daughter   . Coronary  artery disease Other     family history  . Cancer Other     family history  . Breast cancer Paternal Aunt        Objective:    BP 126/82 (BP Location: Right Arm, Patient Position: Sitting, Cuff Size: Small)   Pulse 66   Temp 97.6 F (36.4 C) (Oral)   Resp 18   Ht _0  (1.549 m)   Wt 177 lb (80.3 kg)   SpO2 97%   BMI 33.44 kg/m  Physical Exam  Constitutional: She is oriented to person, place, and time. She appears well-developed and well-nourished. No distress.  HENT:  Head: Normocephalic and atraumatic.  Right Ear: External ear normal.  Left Ear: External ear normal.  Nose: Nose normal.  Mouth/Throat: Oropharynx is clear and moist.  Eyes: Conjunctivae and EOM are normal. Pupils are equal, round, and reactive to light.  Neck: Normal range of motion. Neck supple. Carotid bruit is not present. No thyromegaly present.  Cardiovascular: Normal rate, regular rhythm, normal heart sounds and intact distal pulses.  Exam reveals no gallop and no friction rub.   No murmur heard. Pulmonary/Chest: Effort normal and breath sounds normal. She has no wheezes. She has no rales.  Abdominal: Soft. Bowel sounds are normal. She exhibits no distension and no mass. There is no tenderness. There is no rebound and no guarding.  Musculoskeletal:       Left hip: She exhibits tenderness and bony tenderness. She exhibits normal range of motion and normal strength.       Lumbar back: She exhibits normal range of motion, no tenderness, no bony tenderness and no swelling.  L HIP: +TTP lateral hip at greater trochanteric bursa.  Lymphadenopathy:    She has no cervical adenopathy.  Neurological: She is alert and oriented to  person, place, and time. No cranial nerve deficit.  Skin: Skin is warm and dry. No rash noted. She is not diaphoretic. No erythema. No pallor.  Well healing wound L pelvic and groin region.  Psychiatric: She has a normal mood and affect. Her behavior is normal.   Results for orders  placed or performed in visit on 11/05/15  CBC with Differential/Platelet  Result Value Ref Range   WBC 6.1 3.8 - 10.8 K/uL   RBC 4.15 3.80 - 5.10 MIL/uL   Hemoglobin 12.4 11.7 - 15.5 g/dL   HCT 37.2 35.0 - 45.0 %   MCV 89.6 80.0 - 100.0 fL   MCH 29.9 27.0 - 33.0 pg   MCHC 33.3 32.0 - 36.0 g/dL   RDW 13.2 11.0 - 15.0 %   Platelets 248 140 - 400 K/uL   MPV 9.8 7.5 - 12.5 fL   Neutro Abs 2,379 1,500 - 7,800 cells/uL   Lymphs Abs 3,294 850 - 3,900 cells/uL   Monocytes Absolute 366 200 - 950 cells/uL   Eosinophils Absolute 61 15 - 500 cells/uL   Basophils Absolute 0 0 - 200 cells/uL   Neutrophils Relative % 39 %   Lymphocytes Relative 54 %   Monocytes Relative 6 %   Eosinophils Relative 1 %   Basophils Relative 0 %   Smear Review Criteria for review not met   Comprehensive metabolic panel  Result Value Ref Range   Sodium 142 135 - 146 mmol/L   Potassium 3.8 3.5 - 5.3 mmol/L   Chloride 106 98 - 110 mmol/L   CO2 25 20 - 31 mmol/L   Glucose, Bld 101 (H) 65 - 99 mg/dL   BUN 10 7 - 25 mg/dL   Creat 0.71 0.50 - 0.99 mg/dL   Total Bilirubin 0.5 0.2 - 1.2 mg/dL   Alkaline Phosphatase 61 33 - 130 U/L   AST 20 10 - 35 U/L   ALT 11 6 - 29 U/L   Total Protein 6.5 6.1 - 8.1 g/dL   Albumin 3.9 3.6 - 5.1 g/dL   Calcium 9.6 8.6 - 10.4 mg/dL  Hemoglobin A1c  Result Value Ref Range   Hgb A1c MFr Bld 5.3 <5.7 %   Mean Plasma Glucose 105 mg/dL  Magnesium  Result Value Ref Range   Magnesium 1.5 1.5 - 2.5 mg/dL       Assessment & Plan:   1. HYPERTENSION, BENIGN   2. OSA on CPAP   3. Gastroesophageal reflux disease without esophagitis   4. Glucose intolerance (impaired glucose tolerance)   5. Need for prophylactic vaccination and inoculation against influenza   6. Adjustment insomnia   7. Greater trochanteric bursitis of left hip   8. Postherpetic neuralgia    -new onset insomnia; treat with hydroxyzine qhs for insomnia and itching. -provided order for CPAP equipment.  Good  compliance with CPAP; good symptomatic control  -new onset L greater trochanteric bursitis; home exercise program provided. -obtain labs.  Recommend weight loss, exercise, low-sugar food choices. -refill of hydrocodone provided for ongoing post-herpetic neuralgia and hip bursitis.   Orders Placed This Encounter  Procedures  . For home use only DME continuous positive airway pressure (CPAP)    Order Specific Question:   Patient has OSA or probable OSA    Answer:   Yes    Order Specific Question:   Is the patient currently using CPAP in the home    Answer:   Yes    Order Specific Question:  If yes (to question two)    Answer:   Determine DME provider and inform them of any new orders/settings    Order Specific Question:   If no (to question two) date of sleep study    Answer:   [facility]    Order Specific Question:   Date of face to face encounter    Answer:   11-05-2015    Order Specific Question:   Settings    Answer:   5-10    Order Specific Question:   Signs and symptoms of probable OSA  (select all that apply)    Answer:   Witnessed apneas    Order Specific Question:   Signs and symptoms of probable OSA  (select all that apply)    Answer:   Snoring    Order Specific Question:   CPAP supplies needed    Answer:   Mask, headgear, cushions, filters, heated tubing and water chamber  . Flu Vaccine QUAD 36+ mos IM  . CBC with Differential/Platelet  . Comprehensive metabolic panel  . Hemoglobin A1c  . Magnesium   Meds ordered this encounter  Medications  . HYDROcodone-acetaminophen (NORCO/VICODIN) 5-325 MG tablet    Sig: Take 1-2 tablets by mouth every 6 (six) hours as needed for moderate pain.    Dispense:  60 tablet    Refill:  0  . hydrOXYzine (ATARAX/VISTARIL) 25 MG tablet    Sig: Take 0.5-1 tablets (12.5-25 mg total) by mouth at bedtime as needed (insomnia).    Dispense:  30 tablet    Refill:  2    Return in about 3 months (around 02/05/2016) for  recheck.   Malcolm Hetz Elayne Guerin, M.D. [facility] [address] [place], [place]  [address] [phone] phone [phone] fax

## 2015-11-06 LAB — HEMOGLOBIN A1C
Hgb A1c MFr Bld: 5.3 % (ref <5.7)
Mean Plasma Glucose: 105 mg/dL

## 2015-11-08 ENCOUNTER — Other Ambulatory Visit: Payer: Self-pay

## 2015-11-08 NOTE — Telephone Encounter (Signed)
seen 11/05/15

## 2015-11-10 MED ORDER — NORTRIPTYLINE HCL 25 MG PO CAPS: 25.0000 mg | ORAL_CAPSULE | Freq: Three times a day (TID) | ORAL | 3 refills | Status: DC

## 2015-11-25 ENCOUNTER — Telehealth: Payer: Self-pay

## 2015-11-25 NOTE — Telephone Encounter (Signed)
Office in [place] called today on behalf of patient regarding paperwork needed for C-Pap machine. Office requested [place] to fax over all appointment notes from august until now. Please advise  Attention: [employer] Fax: [phone] Phone: [phone] x [phone]

## 2015-11-26 ENCOUNTER — Other Ambulatory Visit: Payer: Self-pay | Admitting: Family Medicine

## 2015-11-29 NOTE — Telephone Encounter (Signed)
11/2015 last ov and lab

## 2015-12-05 NOTE — Telephone Encounter (Signed)
Patient called VERY upset that these forms have not been sent into [facility] because she was charged over $300 for her CPAP machine. She was very upset and yelling into the phone about how we are slacking at our job and she needs these forms sent in because they should have billed her insurance and not charged her.  Let her speak to [facility].

## 2015-12-05 NOTE — Telephone Encounter (Signed)
It looks like this message is from [facility] office in [employer], judging from ph #.  Med Recs can you please address this?

## 2015-12-05 NOTE — Telephone Encounter (Addendum)
Spoke with Caryl Pina of [facility]; received a new device in July 2017.  [employer] requires a face to face encounter with provider. Compliance data of CPAP not included with two faxes sent on 11/18/15 and 12/02/15.  No record of compliance report faxed to [facility] since receiving new CPAP device.  Faxing now per [place] at [facility]. Compliance report received from [facility]. Recent visit on 11/05/2015 addended to reflect compliance with CPAP.  Spoke with patient and advised that I personally faxed information twice to [facility].

## 2016-01-06 DIAGNOSIS — H3589 Other specified retinal disorders: Secondary | ICD-10-CM | POA: Diagnosis not present

## 2016-01-06 DIAGNOSIS — H524 Presbyopia: Secondary | ICD-10-CM | POA: Diagnosis not present

## 2016-01-06 DIAGNOSIS — H5203 Hypermetropia, bilateral: Secondary | ICD-10-CM | POA: Diagnosis not present

## 2016-01-06 DIAGNOSIS — H52223 Regular astigmatism, bilateral: Secondary | ICD-10-CM | POA: Diagnosis not present

## 2016-01-06 DIAGNOSIS — Z79899 Other long term (current) drug therapy: Secondary | ICD-10-CM | POA: Diagnosis not present

## 2016-01-06 DIAGNOSIS — H25813 Combined forms of age-related cataract, bilateral: Secondary | ICD-10-CM | POA: Diagnosis not present

## 2016-01-06 DIAGNOSIS — M3210 Systemic lupus erythematosus, organ or system involvement unspecified: Secondary | ICD-10-CM | POA: Diagnosis not present

## 2016-01-28 ENCOUNTER — Telehealth: Payer: Self-pay

## 2016-02-10 ENCOUNTER — Ambulatory Visit (INDEPENDENT_AMBULATORY_CARE_PROVIDER_SITE_OTHER): Payer: 59 | Admitting: Family Medicine

## 2016-02-10 ENCOUNTER — Encounter: Payer: Self-pay | Admitting: Family Medicine

## 2016-02-10 VITALS — BP 127/87 | HR 66 | Temp 97.7°F | Resp 16 | Ht 61.5 in | Wt 179.0 lb

## 2016-02-10 DIAGNOSIS — E876 Hypokalemia: Secondary | ICD-10-CM | POA: Diagnosis not present

## 2016-02-10 DIAGNOSIS — Z0000 Encounter for general adult medical examination without abnormal findings: Secondary | ICD-10-CM | POA: Diagnosis not present

## 2016-02-10 DIAGNOSIS — Z1231 Encounter for screening mammogram for malignant neoplasm of breast: Secondary | ICD-10-CM | POA: Diagnosis not present

## 2016-02-10 DIAGNOSIS — Z1211 Encounter for screening for malignant neoplasm of colon: Secondary | ICD-10-CM

## 2016-02-10 DIAGNOSIS — L930 Discoid lupus erythematosus: Secondary | ICD-10-CM | POA: Diagnosis not present

## 2016-02-10 DIAGNOSIS — G4733 Obstructive sleep apnea (adult) (pediatric): Secondary | ICD-10-CM | POA: Diagnosis not present

## 2016-02-10 DIAGNOSIS — M48061 Spinal stenosis, lumbar region without neurogenic claudication: Secondary | ICD-10-CM | POA: Diagnosis not present

## 2016-02-10 DIAGNOSIS — D320 Benign neoplasm of cerebral meninges: Secondary | ICD-10-CM | POA: Diagnosis not present

## 2016-02-10 DIAGNOSIS — D1802 Hemangioma of intracranial structures: Secondary | ICD-10-CM | POA: Diagnosis not present

## 2016-02-10 DIAGNOSIS — K219 Gastro-esophageal reflux disease without esophagitis: Secondary | ICD-10-CM | POA: Diagnosis not present

## 2016-02-10 DIAGNOSIS — I10 Essential (primary) hypertension: Secondary | ICD-10-CM

## 2016-02-10 DIAGNOSIS — R7302 Impaired glucose tolerance (oral): Secondary | ICD-10-CM

## 2016-02-10 DIAGNOSIS — Z13220 Encounter for screening for lipoid disorders: Secondary | ICD-10-CM

## 2016-02-10 DIAGNOSIS — M25552 Pain in left hip: Secondary | ICD-10-CM

## 2016-02-10 DIAGNOSIS — Z9989 Dependence on other enabling machines and devices: Secondary | ICD-10-CM

## 2016-02-10 LAB — POCT URINALYSIS DIP (MANUAL ENTRY)
BILIRUBIN UA: NEGATIVE
BILIRUBIN UA: NEGATIVE
GLUCOSE UA: NEGATIVE
Leukocytes, UA: NEGATIVE
Nitrite, UA: NEGATIVE
PH UA: 6
Protein Ur, POC: NEGATIVE
Urobilinogen, UA: 0.2

## 2016-02-10 MED ORDER — HYDROCODONE-ACETAMINOPHEN 5-325 MG PO TABS: 1.0000 | ORAL_TABLET | Freq: Four times a day (QID) | ORAL | 0 refills | Status: DC | PRN

## 2016-02-10 NOTE — Patient Instructions (Addendum)
   IF you received an x-ray today, you will receive an invoice from [facility]. Please contact [facility] at [phone] with questions or concerns regarding your invoice.   IF you received labwork today, you will receive an invoice from [facility]. Please contact [facility] at [phone] with questions or concerns regarding your invoice.   Our billing staff will not be able to assist you with questions regarding bills from these companies.  You will be contacted with the lab results as soon as they are available. The fastest way to get your results is to activate your My Chart account. Instructions are located on the last page of this paperwork. If you have not heard from us regarding the results in 2 weeks, please contact this office.     Keeping You Healthy  Get These Tests  Blood Pressure- Have your blood pressure checked by your healthcare provider at least once a year.  Normal blood pressure is 120/80.  Weight- Have your body mass index (BMI) calculated to screen for obesity.  BMI is a measure of body fat based on height and weight.  You can calculate your own BMI at www.nhlbisupport.com/bmi/  Cholesterol- Have your cholesterol checked every year.  Diabetes- Have your blood sugar checked every year if you have high blood pressure, high cholesterol, a family history of diabetes or if you are overweight.  Pap Test - Have a pap test every 1 to 5 years if you have been sexually active.  If you are older than 65 and recent pap tests have been normal you may not need additional pap tests.  In addition, if you have had a hysterectomy  for benign disease additional pap tests are not necessary.  Mammogram-Yearly mammograms are essential for early detection of breast cancer  Screening for Colon Cancer- Colonoscopy starting at age 50. Screening may begin sooner depending on your family history and other health conditions.  Follow up colonoscopy as directed by your  Gastroenterologist.  Screening for Osteoporosis- Screening begins at age 65 with bone density scanning, sooner if you are at higher risk for developing Osteoporosis.  Get these medicines  Calcium with Vitamin D- Your body requires 1200-1500 mg of Calcium a day and 800-1000 IU of Vitamin D a day.  You can only absorb 500 mg of Calcium at a time therefore Calcium must be taken in 2 or 3 separate doses throughout the day.  Hormones- Hormone therapy has been associated with increased risk for certain cancers and heart disease.  Talk to your healthcare provider about if you need relief from menopausal symptoms.  Aspirin- Ask your healthcare provider about taking Aspirin to prevent Heart Disease and Stroke.  Get these Immuniztions  Flu shot- Every fall  Pneumonia shot- Once after the age of 65; if you are younger ask your healthcare provider if you need a pneumonia shot.  Tetanus- Every ten years.  Zostavax- Once after the age of 60 to prevent shingles.  Take these steps  Don't smoke- Your healthcare provider can help you quit. For tips on how to quit, ask your healthcare provider or go to www.smokefree.gov or call 1-800 QUIT-NOW.  Be physically active- Exercise 5 days a week for a minimum of 30 minutes.  If you are not already physically active, start slow and gradually work up to 30 minutes of moderate physical activity.  Try walking, dancing, bike riding, swimming, etc.  Eat a healthy diet- Eat a variety of healthy foods such as fruits, vegetables, whole grains, low   fat milk, low fat cheeses, yogurt, lean meats, chicken, fish, eggs, dried beans, tofu, etc.  For more information go to www.thenutritionsource.org  Dental visit- Brush and floss teeth twice daily; visit your dentist twice a year.  Eye exam- Visit your Optometrist or Ophthalmologist yearly.  Drink alcohol in moderation- Limit alcohol intake to one drink or less a day.  Never drink and drive.  Depression- Your emotional  health is as important as your physical health.  If you're feeling down or losing interest in things you normally enjoy, please talk to your healthcare provider.  Seat Belts- can save your life; always wear one  Smoke/Carbon Monoxide detectors- These detectors need to be installed on the appropriate level of your home.  Replace batteries at least once a year.  Violence- If anyone is threatening or hurting you, please tell your healthcare provider.  Living Will/ Health care power of attorney- Discuss with your healthcare provider and family.  

## 2016-02-10 NOTE — Progress Notes (Signed)
Subjective:    Patient ID: Maureen Hodge, female    DOB: September 25, 1950, 66 y.o.   MRN: [id number]  02/10/2016  Annual Exam and Medication Refill (SEE MEDS WITH R beside them)   HPI This 66 y.o. female presents for Complete Physical Examination.  Last physical:   Pap smear: n/a Mammogram: 01-2015; Dr. Tamala Julian recommended PCP ordering.  Colonoscopy: need reschedule Bone density: n/a Eye exam:  01/2016; glasses; goes every six months. Dental exam:  Every six months.  Immunization History  Administered Date(s) Administered  . Influenza,inj,Quad PF,36+ Mos 10/23/2012, 09/24/2013, 11/15/2014, 11/05/2015  . Tdap 10/05/2010   BP Readings from Last 3 Encounters:  02/10/16 127/87  11/05/15 126/82  10/09/15 124/78   Wt Readings from Last 3 Encounters:  02/10/16 179 lb (81.2 kg)  11/05/15 177 lb (80.3 kg)  10/09/15 181 lb 6.4 oz (82.3 kg)      Review of Systems  Constitutional: Negative for activity change, appetite change, chills, diaphoresis, fatigue, fever and unexpected weight change.  HENT: Negative for congestion, dental problem, drooling, ear discharge, ear pain, facial swelling, hearing loss, mouth sores, nosebleeds, postnasal drip, rhinorrhea, sinus pressure, sneezing, sore throat, tinnitus, trouble swallowing and voice change.   Eyes: Negative for photophobia, pain, discharge, redness, itching and visual disturbance.  Respiratory: Negative for apnea, cough, choking, chest tightness, shortness of breath, wheezing and stridor.   Cardiovascular: Negative for chest pain, palpitations and leg swelling.  Gastrointestinal: Negative for abdominal distention, abdominal pain, anal bleeding, blood in stool, constipation, diarrhea, nausea, rectal pain and vomiting.  Endocrine: Negative for cold intolerance, heat intolerance, polydipsia, polyphagia and polyuria.  Genitourinary: Negative for decreased urine volume, difficulty urinating, dyspareunia, dysuria, enuresis, flank pain, frequency,  genital sores, hematuria, menstrual problem, pelvic pain, urgency, vaginal bleeding, vaginal discharge and vaginal pain.  Musculoskeletal: Negative for arthralgias, back pain, gait problem, joint swelling, myalgias, neck pain and neck stiffness.  Skin: Negative for color change, pallor, rash and wound.  Allergic/Immunologic: Negative for environmental allergies, food allergies and immunocompromised state.  Neurological: Negative for dizziness, tremors, seizures, syncope, facial asymmetry, speech difficulty, weakness, light-headedness, numbness and headaches.  Hematological: Negative for adenopathy. Does not bruise/bleed easily.  Psychiatric/Behavioral: Negative for agitation, behavioral problems, confusion, decreased concentration, dysphoric mood, hallucinations, self-injury, sleep disturbance and suicidal ideas. The patient is not nervous/anxious and is not hyperactive.     Past Medical History:  Diagnosis Date  . Allergy   . Brain tumor ([place]) 05/2011   s/p resection  [facility].  [name] Cerebral aneurysm, nonruptured   . Diabetes mellitus    Pre-diabetes  . Diffuse cystic mastopathy   . Esophageal reflux   . Hypertension   . Hypopotassemia 06/01/2007  . Intervertebral disc disorder with myelopathy, unspecified region   . Lupus erythematosus   . Migraines   . Other convulsions   . PLMD (periodic limb movement disorder)   . S/P bunionectomy 09/28/2013   [facility].  Right.  [name] Unspecified sleep apnea    CPAP titrated to 7cm   Past Surgical History:  Procedure Laterality Date  . BRAIN AVM REPAIR    . BRAIN SURGERY  2013  . BREAST BIOPSY Bilateral    neg  . BREAST CYST ASPIRATION Bilateral   . BREAST SURGERY     x 5 due to fibrocystic disease  . DILATION AND CURETTAGE OF UTERUS     x 5  . FOOT SURGERY    . HEMORRHOID SURGERY    . HERNIA REPAIR    .  lumbar surgery  2000  . SPINE SURGERY    . TONSILLECTOMY AND ADENOIDECTOMY    . TOTAL ABDOMINAL HYSTERECTOMY  1979   postpartum  hemorrhage; ovaries intact.   Allergies  Allergen Reactions  . Lisinopril Swelling    Face and lips    Social History   Social History  . Marital status: Married    Spouse name: N/A  . Number of children: 2  . Years of education: N/A   Occupational History  . Retired    Social History Main Topics  . Smoking status: Never Smoker  . Smokeless tobacco: Never Used  . Alcohol use No  . Drug use: No  . Sexual activity: Yes   Other Topics Concern  . Not on file   Social History Narrative   Marital status:  Married  X 46 years, happily , no abuse.       Children:  2 children; 4 grandchildren.      Lives: with husband.      Employment: disability.  Works part-time at [employer]; working PRN;.      Tobacco: never      Alcohol: wine once per month; 2-3 times per month      Drugs: none     Exercise: none     Caffeine Use: Some.    Seatbelt:  Always uses seat belts.       Smoke alarm in the home.   Exercise: Some   Family History  Problem Relation Age of Onset  . Heart disease Mother     CHF  . Heart failure Mother     CHF  . Hypertension Mother   . Stroke Mother   . Cancer Mother 62    unknown primary  . Hypertension Sister   . Crohn's disease Sister   . Arthritis Sister     partial knee replacement.  . Diabetes Daughter   . Coronary artery disease Other     family history  . Cancer Other     family history  . Breast cancer Paternal Aunt        Objective:    BP 127/87 (BP Location: Right Arm, Patient Position: Sitting, Cuff Size: Large)   Pulse 66   Temp 97.7 F (36.5 C) (Oral)   Resp 16   Ht 5' 1.5" (1.562 m)   Wt 179 lb (81.2 kg)   SpO2 97%   BMI 33.27 kg/m  Physical Exam  Constitutional: She is oriented to person, place, and time. She appears well-developed and well-nourished. No distress.  HENT:  Head: Normocephalic and atraumatic.  Right Ear: External ear normal.  Left Ear: External ear normal.  Nose: Nose normal.  Mouth/Throat:  Oropharynx is clear and moist.  Eyes: Conjunctivae and EOM are normal. Pupils are equal, round, and reactive to light.  Neck: Normal range of motion and full passive range of motion without pain. Neck supple. No JVD present. Carotid bruit is not present. No thyromegaly present.  Cardiovascular: Normal rate, regular rhythm and normal heart sounds.  Exam reveals no gallop and no friction rub.   No murmur heard. Pulmonary/Chest: Effort normal and breath sounds normal. She has no wheezes. She has no rales. Right breast exhibits no inverted nipple, no mass, no nipple discharge, no skin change and no tenderness. Left breast exhibits no inverted nipple, no mass, no nipple discharge, no skin change and no tenderness. Breasts are symmetrical.  Abdominal: Soft. Bowel sounds are normal. She exhibits no distension and no mass. There  is no tenderness. There is no rebound and no guarding.  Musculoskeletal:       Right shoulder: Normal.       Left shoulder: Normal.       Cervical back: Normal.  Lymphadenopathy:    She has no cervical adenopathy.  Neurological: She is alert and oriented to person, place, and time. She has normal reflexes. No cranial nerve deficit. She exhibits normal muscle tone. Coordination normal.  Skin: Skin is warm and dry. No rash noted. She is not diaphoretic. No erythema. No pallor.  Psychiatric: She has a normal mood and affect. Her behavior is normal. Judgment and thought content normal.  Nursing note and vitals reviewed.  Depression screen [facility] 2/9 02/10/2016 11/05/2015 10/09/2015 09/10/2015 08/30/2015  Decreased Interest 0 0 0 0 0  Down, Depressed, Hopeless 0 0 0 0 0  PHQ - 2 Score 0 0 0 0 0   Fall Risk  02/10/2016 11/05/2015 10/09/2015 09/10/2015 08/30/2015  Falls in the past year? No No No No No        Assessment & Plan:   1. Routine physical examination   2. HYPERTENSION, BENIGN   3. 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 OSA on CPAP   5. Gastroesophageal reflux disease without  esophagitis   6. Glucose intolerance (impaired glucose tolerance)   7. Benign neoplasm of cerebral meninges (HCC)   8. Discoid lupus   9. Hypokalemia   10. Spinal stenosis of lumbar region without neurogenic claudication   11. Screening, lipid   12. Encounter for screening mammogram for breast cancer   13. Left hip pain   14. Colon cancer screening    -anticipatory guidance provided --- weight loss, exercise, low-calorie food choices. -refer for mammogram and colonoscopy -refer to ortho for ongoing hip pain; refill of hydrocodone provided. -obtain labs for chronic medical conditions and for age appropriate screenings. -refills of chronic medications provided.   Orders Placed This Encounter  Procedures  . MM SCREENING BREAST TOMO BILATERAL    Standing Status:   Future    Standing Expiration Date:   04/12/2017    Order Specific Question:   Reason for Exam (SYMPTOM  OR DIAGNOSIS REQUIRED)    Answer:   annual screening    Order Specific Question:   Preferred imaging location?    Answer:   [facility]  . CBC with Differential/Platelet  . Comprehensive metabolic panel    Order Specific Question:   Has the patient fasted?    Answer:   Yes  . Hemoglobin A1c  . Lipid panel    Order Specific Question:   Has the patient fasted?    Answer:   Yes  . Microalbumin, urine  . TSH  . Ambulatory referral to Orthopedic Surgery    Referral Priority:   Routine    Referral Type:   Surgical    Referral Reason:   Specialty Services Required    Requested Specialty:   Orthopedic Surgery    Number of Visits Requested:   1  . Ambulatory referral to Gastroenterology    Referral Priority:   Routine    Referral Type:   Consultation    Referral Reason:   Specialty Services Required    Number of Visits Requested:   1  . POCT urinalysis dipstick  . EKG 12-Lead   Meds ordered this encounter  Medications  . HYDROcodone-acetaminophen (NORCO/VICODIN) 5-325 MG tablet    Sig: Take 1-2 tablets by  mouth every 6 (six) hours as needed  for moderate pain.    Dispense:  60 tablet    Refill:  0    Return in about 4 months (around 06/09/2016) for recheck blood pressure.   Maureen Hodge, M.D. Primary Care at [facility] previously [facility] [address] [place], [place]  [address] [phone] phone [phone] fax

## 2016-02-11 LAB — LIPID PANEL
Chol/HDL Ratio: 2.5 ratio units (ref 0.0–4.4)
Cholesterol, Total: 168 mg/dL (ref 100–199)
HDL: 66 mg/dL (ref >39)
LDL Calculated: 84 mg/dL (ref 0–99)
Triglycerides: 88 mg/dL (ref 0–149)
VLDL Cholesterol Cal: 18 mg/dL (ref 5–40)

## 2016-02-11 LAB — COMPREHENSIVE METABOLIC PANEL
ALBUMIN: 4 g/dL (ref 3.6–4.8)
ALK PHOS: 97 IU/L (ref 39–117)
ALT: 15 IU/L (ref 0–32)
AST: 18 IU/L (ref 0–40)
Albumin/Globulin Ratio: 1.5 (ref 1.2–2.2)
BUN/Creatinine Ratio: 17 (ref 12–28)
BUN: 13 mg/dL (ref 8–27)
Bilirubin Total: 0.5 mg/dL (ref 0.0–1.2)
CALCIUM: 9.5 mg/dL (ref 8.7–10.3)
CO2: 28 mmol/L (ref 18–29)
CREATININE: 0.78 mg/dL (ref 0.57–1.00)
Chloride: 103 mmol/L (ref 96–106)
GFR calc Af Amer: 94 mL/min/{1.73_m2} (ref >59)
GFR, EST NON AFRICAN AMERICAN: 81 mL/min/{1.73_m2} (ref >59)
GLOBULIN, TOTAL: 2.7 g/dL (ref 1.5–4.5)
GLUCOSE: 89 mg/dL (ref 65–99)
Potassium: 3.8 mmol/L (ref 3.5–5.2)
SODIUM: 145 mmol/L — AB (ref 134–144)
Total Protein: 6.7 g/dL (ref 6.0–8.5)

## 2016-02-11 LAB — CBC WITH DIFFERENTIAL/PLATELET
BASOS ABS: 0 10*3/uL (ref 0.0–0.2)
Basos: 0 %
EOS (ABSOLUTE): 0.1 10*3/uL (ref 0.0–0.4)
EOS: 1 %
HEMATOCRIT: 36.6 % (ref 34.0–46.6)
HEMOGLOBIN: 12.2 g/dL (ref 11.1–15.9)
Immature Grans (Abs): 0 10*3/uL (ref 0.0–0.1)
Immature Granulocytes: 0 %
LYMPHS: 47 %
Lymphocytes Absolute: 3.1 10*3/uL (ref 0.7–3.1)
MCH: 28.3 pg (ref 26.6–33.0)
MCHC: 33.3 g/dL (ref 31.5–35.7)
MCV: 85 fL (ref 79–97)
MONOCYTES: 6 %
Monocytes Absolute: 0.4 10*3/uL (ref 0.1–0.9)
NEUTROS PCT: 46 %
Neutrophils Absolute: 3 10*3/uL (ref 1.4–7.0)
Platelets: 216 10*3/uL (ref 150–379)
RBC: 4.31 x10E6/uL (ref 3.77–5.28)
RDW: 15.5 % — ABNORMAL HIGH (ref 12.3–15.4)
WBC: 6.6 10*3/uL (ref 3.4–10.8)

## 2016-02-11 LAB — HEMOGLOBIN A1C
ESTIMATED AVERAGE GLUCOSE: 117 mg/dL
HEMOGLOBIN A1C: 5.7 % — AB (ref 4.8–5.6)

## 2016-02-11 LAB — MICROALBUMIN, URINE: MICROALBUM., U, RANDOM: 11.7 ug/mL

## 2016-02-11 LAB — TSH: TSH: 3.51 u[IU]/mL (ref 0.450–4.500)

## 2016-02-13 DIAGNOSIS — T887XXD Unspecified adverse effect of drug or medicament, subsequent encounter: Secondary | ICD-10-CM | POA: Diagnosis not present

## 2016-02-13 DIAGNOSIS — B351 Tinea unguium: Secondary | ICD-10-CM | POA: Diagnosis not present

## 2016-02-13 DIAGNOSIS — L930 Discoid lupus erythematosus: Secondary | ICD-10-CM | POA: Diagnosis not present

## 2016-03-09 ENCOUNTER — Other Ambulatory Visit: Payer: Self-pay | Admitting: Family Medicine

## 2016-03-09 MED ORDER — CETIRIZINE HCL 10 MG PO TABS: 10.0000 mg | ORAL_TABLET | Freq: Every day | ORAL | 3 refills | Status: DC

## 2016-03-10 ENCOUNTER — Other Ambulatory Visit: Payer: Self-pay | Admitting: Family Medicine

## 2016-03-18 DIAGNOSIS — L930 Discoid lupus erythematosus: Secondary | ICD-10-CM | POA: Diagnosis not present

## 2016-03-18 DIAGNOSIS — M353 Polymyalgia rheumatica: Secondary | ICD-10-CM | POA: Diagnosis not present

## 2016-03-26 ENCOUNTER — Other Ambulatory Visit: Payer: Self-pay | Admitting: Family Medicine

## 2016-04-01 ENCOUNTER — Telehealth: Payer: Self-pay | Admitting: Family Medicine

## 2016-04-01 NOTE — Telephone Encounter (Signed)
Maureen W. from [facility] wanted to call and let you know [name] cancel her appointment.

## 2016-04-05 ENCOUNTER — Other Ambulatory Visit: Payer: Self-pay | Admitting: Family Medicine

## 2016-04-05 NOTE — Telephone Encounter (Signed)
10/2015 last refill

## 2016-04-06 DIAGNOSIS — M353 Polymyalgia rheumatica: Secondary | ICD-10-CM | POA: Diagnosis not present

## 2016-04-14 DIAGNOSIS — M353 Polymyalgia rheumatica: Secondary | ICD-10-CM | POA: Diagnosis not present

## 2016-04-28 DIAGNOSIS — M353 Polymyalgia rheumatica: Secondary | ICD-10-CM | POA: Diagnosis not present

## 2016-04-28 DIAGNOSIS — Z7189 Other specified counseling: Secondary | ICD-10-CM | POA: Diagnosis not present

## 2016-04-28 DIAGNOSIS — L930 Discoid lupus erythematosus: Secondary | ICD-10-CM | POA: Diagnosis not present

## 2016-05-15 DIAGNOSIS — D496 Neoplasm of unspecified behavior of brain: Secondary | ICD-10-CM | POA: Diagnosis not present

## 2016-05-15 DIAGNOSIS — Z7952 Long term (current) use of systemic steroids: Secondary | ICD-10-CM | POA: Diagnosis not present

## 2016-05-15 DIAGNOSIS — K219 Gastro-esophageal reflux disease without esophagitis: Secondary | ICD-10-CM | POA: Diagnosis not present

## 2016-05-15 DIAGNOSIS — Z5181 Encounter for therapeutic drug level monitoring: Secondary | ICD-10-CM | POA: Diagnosis not present

## 2016-05-15 DIAGNOSIS — M7989 Other specified soft tissue disorders: Secondary | ICD-10-CM | POA: Diagnosis not present

## 2016-05-15 DIAGNOSIS — K449 Diaphragmatic hernia without obstruction or gangrene: Secondary | ICD-10-CM | POA: Diagnosis not present

## 2016-05-15 DIAGNOSIS — R0600 Dyspnea, unspecified: Secondary | ICD-10-CM | POA: Diagnosis not present

## 2016-05-15 DIAGNOSIS — M1990 Unspecified osteoarthritis, unspecified site: Secondary | ICD-10-CM | POA: Diagnosis not present

## 2016-05-15 DIAGNOSIS — M329 Systemic lupus erythematosus, unspecified: Secondary | ICD-10-CM | POA: Diagnosis not present

## 2016-05-15 DIAGNOSIS — R600 Localized edema: Secondary | ICD-10-CM | POA: Diagnosis not present

## 2016-05-15 DIAGNOSIS — R079 Chest pain, unspecified: Secondary | ICD-10-CM | POA: Diagnosis not present

## 2016-05-15 DIAGNOSIS — I517 Cardiomegaly: Secondary | ICD-10-CM | POA: Diagnosis not present

## 2016-05-15 DIAGNOSIS — R0789 Other chest pain: Secondary | ICD-10-CM | POA: Diagnosis not present

## 2016-05-15 DIAGNOSIS — I898 Other specified noninfective disorders of lymphatic vessels and lymph nodes: Secondary | ICD-10-CM | POA: Diagnosis not present

## 2016-05-15 DIAGNOSIS — I10 Essential (primary) hypertension: Secondary | ICD-10-CM | POA: Diagnosis not present

## 2016-05-16 DIAGNOSIS — R079 Chest pain, unspecified: Secondary | ICD-10-CM | POA: Diagnosis not present

## 2016-05-16 DIAGNOSIS — K449 Diaphragmatic hernia without obstruction or gangrene: Secondary | ICD-10-CM | POA: Diagnosis not present

## 2016-05-16 DIAGNOSIS — I517 Cardiomegaly: Secondary | ICD-10-CM | POA: Diagnosis not present

## 2016-05-17 ENCOUNTER — Telehealth: Payer: Self-pay | Admitting: Family Medicine

## 2016-05-21 ENCOUNTER — Ambulatory Visit: Payer: 59 | Admitting: Family Medicine

## 2016-05-25 ENCOUNTER — Ambulatory Visit (INDEPENDENT_AMBULATORY_CARE_PROVIDER_SITE_OTHER): Payer: 59 | Admitting: Family Medicine

## 2016-05-25 VITALS — BP 124/84 | HR 76 | Temp 97.8°F | Resp 16 | Ht 61.25 in | Wt 190.2 lb

## 2016-05-25 DIAGNOSIS — G4733 Obstructive sleep apnea (adult) (pediatric): Secondary | ICD-10-CM

## 2016-05-25 DIAGNOSIS — Z9989 Dependence on other enabling machines and devices: Secondary | ICD-10-CM

## 2016-05-25 DIAGNOSIS — E876 Hypokalemia: Secondary | ICD-10-CM | POA: Diagnosis not present

## 2016-05-25 DIAGNOSIS — I10 Essential (primary) hypertension: Secondary | ICD-10-CM | POA: Diagnosis not present

## 2016-05-25 DIAGNOSIS — R61 Generalized hyperhidrosis: Secondary | ICD-10-CM | POA: Diagnosis not present

## 2016-05-25 DIAGNOSIS — R0789 Other chest pain: Secondary | ICD-10-CM | POA: Diagnosis not present

## 2016-05-25 DIAGNOSIS — D72825 Bandemia: Secondary | ICD-10-CM

## 2016-05-25 DIAGNOSIS — K219 Gastro-esophageal reflux disease without esophagitis: Secondary | ICD-10-CM | POA: Diagnosis not present

## 2016-05-25 DIAGNOSIS — R739 Hyperglycemia, unspecified: Secondary | ICD-10-CM

## 2016-05-25 DIAGNOSIS — R0609 Other forms of dyspnea: Secondary | ICD-10-CM

## 2016-05-25 DIAGNOSIS — M353 Polymyalgia rheumatica: Secondary | ICD-10-CM

## 2016-05-25 DIAGNOSIS — I208 Other forms of angina pectoris: Secondary | ICD-10-CM

## 2016-05-25 DIAGNOSIS — R0600 Dyspnea, unspecified: Secondary | ICD-10-CM

## 2016-05-25 MED ORDER — RANITIDINE HCL 150 MG PO CAPS: 150.0000 mg | ORAL_CAPSULE | Freq: Two times a day (BID) | ORAL | 0 refills | Status: DC

## 2016-05-25 NOTE — Patient Instructions (Addendum)
   TAKE ZANTAC/RANITIDINE 150MG  ONE AT BEDTIME FOR HEARTBURN.  CONTINUE TAKING PROTONIX DAILY. TAKE LASIX 40MG  ONE DAILY EVERY DAY THIS WEEK.   IF you received an x-ray today, you will receive an invoice from [facility]. Please contact [facility] at [phone] with questions or concerns regarding your invoice.   IF you received labwork today, you will receive an invoice from [facility]. Please contact [facility] at [phone] with questions or concerns regarding your invoice.   Our billing staff will not be able to assist you with questions regarding bills from these companies.  You will be contacted with the lab results as soon as they are available. The fastest way to get your results is to activate your My Chart account. Instructions are located on the last page of this paperwork. If you have not heard from [place] regarding the results in 2 weeks, please contact this office.

## 2016-05-25 NOTE — Progress Notes (Signed)
Subjective:    Patient ID: Maureen Hodge, female    DOB: 13-Jul-1950, 65 y.o.   MRN: [id number]  05/25/2016  Follow-up (stress test, sweating, seen at [facility] on 05/15/16) and Chest Pain   HPI This 66 y.o. female presents for [place] for ED visit at [facility].  ED evaluation on 05/15/16 at [facility] ED for chest pain.  Recommended admission for further cardiac testing but patient declined.  Developed SOB and chest pressure; presented to ED.  Had been to [place] and still adjusting from jet lag.  Did straighten up and clean up.  Trying to get unpacked.  Was sitting down with onset of SOB and chest pain.  Husband kept watching patient and then worsened at 10:00pm.  Substernal chest tightness and SOB.  Went to urinate when arrived to ED, DOE worsened. Troponin x 2 negative.   Then left last weekend for [facility].    Evaluation and interpretation of radiographic studies from ED visit: Chest radiograph: 1. Cardiomediastinal contours are stable with widened mediastinum. While these findings are stable if there is clinical concern for aortic injury, further evaluation can be performed with CTA. 2. The lungs are clear. No pleural effusions. No pneumothorax. Arm positioning limits lateral radiograph. Interpretation of Electrocardiogram: NSR, nl intervals. No ST T wave changes.  CT chest: Negative for PE. Scattered atelectasis. Small hiatal hernia. Calcified left axillary lymph node.  4:26 AM pt offered CEU for stress test. She declined preferring to follow up with her physician. She understands relative risks and benefits of this course of action. Will check 2nd set troponin, if not rising, d/c.  Dispo: chest pain- d/c'd.   CK 65 and 76 CK-MB 1 and 2 Troponin T <0.01, 0.01 U/a negative. WBC count 17,800. K 2.9 Glucose 142 Pro BNP 19 TSH 2.74  Has suffered with very minimal chest tightness yet DOE persisted.  Has been burning up.  Excessive sweating has recurred; burning up.  So hot at  [facility].  Dx PMR recently at rheumatology in 02/2016.  Started MTX six once weekly. Prednisone restarted at 40mg  and now down to 12.5mg  for past three months.   No fever/chills/sweats.  No cough.  Having some heartburn; taking Protonix daily right now.     Wt Readings from Last 3 Encounters:  06/07/16 190 lb 8 oz (86.4 kg)  05/25/16 190 lb 3.2 oz (86.3 kg)  02/10/16 179 lb (81.2 kg)   BP Readings from Last 3 Encounters:  06/07/16 110/86  05/25/16 124/84  02/10/16 127/87   Immunization History  Administered Date(s) Administered  . Influenza,inj,Quad PF,36+ Mos 10/23/2012, 09/24/2013, 11/15/2014, 11/05/2015  . Tdap 10/05/2010    Review of Systems  Constitutional: Positive for diaphoresis. Negative for chills, fatigue and fever.  Eyes: Negative for visual disturbance.  Respiratory: Positive for shortness of breath. Negative for cough and wheezing.   Cardiovascular: Positive for chest pain. Negative for palpitations and leg swelling.  Gastrointestinal: Negative for abdominal pain, constipation, diarrhea, nausea and vomiting.  Endocrine: Negative for cold intolerance, heat intolerance, polydipsia, polyphagia and polyuria.  Musculoskeletal: Positive for arthralgias.  Neurological: Negative for dizziness, tremors, seizures, syncope, facial asymmetry, speech difficulty, weakness, light-headedness, numbness and headaches.    Past Medical History:  Diagnosis Date  . Allergy   . Brain tumor ([place]) 05/2011   s/p resection  [facility].  [name] Cerebral aneurysm, nonruptured   . Diabetes mellitus    Pre-diabetes  . Diffuse cystic mastopathy   . Esophageal reflux   .  Hypertension   . Hypopotassemia 06/01/2007  . Intervertebral disc disorder with myelopathy, unspecified region   . Lupus erythematosus   . Migraines   . Other convulsions   . PLMD (periodic limb movement disorder)   . S/P bunionectomy 09/28/2013   [facility].  Right.  [name] Unspecified sleep apnea    CPAP titrated to 7cm     Past Surgical History:  Procedure Laterality Date  . BRAIN AVM REPAIR    . BRAIN SURGERY  2013  . BREAST BIOPSY Bilateral    neg  . BREAST CYST ASPIRATION Bilateral   . BREAST SURGERY     x 5 due to fibrocystic disease  . DILATION AND CURETTAGE OF UTERUS     x 5  . FOOT SURGERY    . HEMORRHOID SURGERY    . HERNIA REPAIR    . lumbar surgery  2000  . SPINE SURGERY    . TONSILLECTOMY AND ADENOIDECTOMY    . TOTAL ABDOMINAL HYSTERECTOMY  1979   postpartum hemorrhage; ovaries intact.   Allergies  Allergen Reactions  . Lisinopril Swelling    Face and lips    Social History   Social History  . Marital status: Married    Spouse name: N/A  . Number of children: 2  . Years of education: N/A   Occupational History  . Retired    Social History Main Topics  . Smoking status: Never Smoker  . Smokeless tobacco: Never Used  . Alcohol use No  . Drug use: No  . Sexual activity: Yes   Other Topics Concern  . Not on file   Social History Narrative   Marital status:  Married  X 46 years, happily , no abuse.       Children:  2 children; 4 grandchildren.      Lives: with husband.      Employment: disability.  Works part-time at [employer]; working PRN;.      Tobacco: never      Alcohol: wine once per month; 2-3 times per month      Drugs: none     Exercise: none     Caffeine Use: Some.    Seatbelt:  Always uses seat belts.       Smoke alarm in the home.   Exercise: Some   Family History  Problem Relation Age of Onset  . Heart disease Mother        CHF  . Heart failure Mother        CHF  . Hypertension Mother   . Stroke Mother   . Cancer Mother 27       unknown primary  . Hypertension Sister   . Crohn's disease Sister   . Arthritis Sister        partial knee replacement.  . Diabetes Daughter   . Coronary artery disease Other        family history  . Cancer Other        family history  . Breast cancer Paternal Aunt        Objective:    BP 124/84    Pulse 76   Temp 97.8 F (36.6 C) (Oral)   Resp 16   Ht 5' 1.25" (1.556 m)   Wt 190 lb 3.2 oz (86.3 kg)   SpO2 98%   BMI 35.65 kg/m  Physical Exam  Constitutional: She is oriented to person, place, and time. She appears well-developed and well-nourished. No distress.  HENT:  Head: Normocephalic and atraumatic.  Right Ear: External ear normal.  Left Ear: External ear normal.  Nose: Nose normal.  Mouth/Throat: Oropharynx is clear and moist.  Eyes: Conjunctivae and EOM are normal. Pupils are equal, round, and reactive to light.  Neck: Normal range of motion. Neck supple. Carotid bruit is not present. No thyromegaly present.  Cardiovascular: Normal rate, regular rhythm, normal heart sounds and intact distal pulses.  Exam reveals no gallop and no friction rub.   No murmur heard. Pulmonary/Chest: Effort normal and breath sounds normal. No respiratory distress. She has no wheezes. She has no rales. She exhibits no tenderness.  Abdominal: Soft. Bowel sounds are normal. She exhibits no distension and no mass. There is no tenderness. There is no rebound and no guarding.  Lymphadenopathy:    She has no cervical adenopathy.  Neurological: She is alert and oriented to person, place, and time. No cranial nerve deficit.  Skin: Skin is warm and dry. No rash noted. She is not diaphoretic. No erythema. No pallor.  Psychiatric: She has a normal mood and affect. Her behavior is normal.   Depression screen [facility] 2/9 05/25/2016 02/10/2016 11/05/2015 10/09/2015 09/10/2015  Decreased Interest 0 0 0 0 0  Down, Depressed, Hopeless 0 0 0 0 0  PHQ - 2 Score 0 0 0 0 0   Fall Risk  05/25/2016 02/10/2016 11/05/2015 10/09/2015 09/10/2015  Falls in the past year? No No No No No       Assessment & Plan:   1. Chest tightness   2. Dyspnea on exertion   3. Excessive sweating   4. Hyperglycemia   5. PMR (polymyalgia rheumatica) (HCC)   6. Hypokalemia   7. Bandemia   8. HYPERTENSION, BENIGN   9. OSA on CPAP   10.  Gastroesophageal reflux disease without esophagitis    -ED records reviewed in detail during visit.  Refer to cardiology for further risk stratification considering hypertension,glucose intolerance,and obesity.  To ED for recurrent chest pain. -obtain labs. -suffering with recurrent diaphoresis but recent rx for Prednisone for PMR new onset. -Rx for Zantac 150mg  bid provided with recent Prednisone rx and chest pain. Continue PPI/Protonix daily. -start Lasix 40mg  one daily for one week only.   Orders Placed This Encounter  Procedures  . CBC with Differential/Platelet  . Comprehensive metabolic panel  . Hemoglobin A1c  . Ambulatory referral to Cardiology    Referral Priority:   Urgent    Referral Type:   Consultation    Referral Reason:   Specialty Services Required    Referred to Provider:   Minna Merritts, MD    Requested Specialty:   Cardiology    Number of Visits Requested:   1   Meds ordered this encounter  Medications  . ranitidine (ZANTAC) 150 MG capsule    Sig: Take 1 capsule (150 mg total) by mouth 2 (two) times daily.    Dispense:  30 capsule    Refill:  0    No Follow-up on file.   Cordney Barstow Elayne Guerin, M.D. Primary Care at [facility] previously [facility] [place] [address] [place], [place]  [address] [phone] phone [phone] fax

## 2016-05-26 LAB — CBC WITH DIFFERENTIAL/PLATELET
BASOS: 0 %
Basophils Absolute: 0 10*3/uL (ref 0.0–0.2)
EOS (ABSOLUTE): 0 10*3/uL (ref 0.0–0.4)
EOS: 0 %
HEMATOCRIT: 39.1 % (ref 34.0–46.6)
Hemoglobin: 13 g/dL (ref 11.1–15.9)
IMMATURE GRANS (ABS): 0 10*3/uL (ref 0.0–0.1)
Immature Granulocytes: 0 %
LYMPHS: 19 %
Lymphocytes Absolute: 2.1 10*3/uL (ref 0.7–3.1)
MCH: 29.6 pg (ref 26.6–33.0)
MCHC: 33.2 g/dL (ref 31.5–35.7)
MCV: 89 fL (ref 79–97)
MONOS ABS: 0.4 10*3/uL (ref 0.1–0.9)
Monocytes: 4 %
NEUTROS PCT: 77 %
Neutrophils Absolute: 8.6 10*3/uL — ABNORMAL HIGH (ref 1.4–7.0)
Platelets: 339 10*3/uL (ref 150–379)
RBC: 4.39 x10E6/uL (ref 3.77–5.28)
RDW: 14.7 % (ref 12.3–15.4)
WBC: 11.1 10*3/uL — ABNORMAL HIGH (ref 3.4–10.8)

## 2016-05-26 LAB — HEMOGLOBIN A1C
Est. average glucose Bld gHb Est-mCnc: 146 mg/dL
Hgb A1c MFr Bld: 6.7 % — ABNORMAL HIGH (ref 4.8–5.6)

## 2016-05-26 LAB — COMPREHENSIVE METABOLIC PANEL
A/G RATIO: 1.5 (ref 1.2–2.2)
ALT: 17 IU/L (ref 0–32)
AST: 20 IU/L (ref 0–40)
Albumin: 3.9 g/dL (ref 3.6–4.8)
Alkaline Phosphatase: 83 IU/L (ref 39–117)
BUN/Creatinine Ratio: 14 (ref 12–28)
BUN: 11 mg/dL (ref 8–27)
Bilirubin Total: 0.4 mg/dL (ref 0.0–1.2)
CALCIUM: 9.2 mg/dL (ref 8.7–10.3)
CO2: 28 mmol/L (ref 18–29)
Chloride: 99 mmol/L (ref 96–106)
Creatinine, Ser: 0.8 mg/dL (ref 0.57–1.00)
GFR, EST AFRICAN AMERICAN: 91 mL/min/{1.73_m2} (ref >59)
GFR, EST NON AFRICAN AMERICAN: 79 mL/min/{1.73_m2} (ref >59)
GLOBULIN, TOTAL: 2.6 g/dL (ref 1.5–4.5)
Glucose: 155 mg/dL — ABNORMAL HIGH (ref 65–99)
POTASSIUM: 3.5 mmol/L (ref 3.5–5.2)
SODIUM: 141 mmol/L (ref 134–144)
Total Protein: 6.5 g/dL (ref 6.0–8.5)

## 2016-06-06 NOTE — Progress Notes (Signed)
Cardiology Office Note  Date:  06/07/2016   ID:  Maureen Hodge, Maureen Hodge [birth year], MRN [id number]  PCP:  Maureen Honour, MD   Chief Complaint  Patient presents with  . other    Follow up from [facility] hospital due to shortness of breath. [facility] advised her to have a stress test. Pt. c/o shortness of breath with little activity. Meds reviewed by the pt. verbally.     HPI:   Maureen Hodge is a 63 her old woman with a past medical history of   brain surgery for a tumor. Dr. Tommi Rumps at [facility] PMR at rheumatology in 02/2016.  Started MTX,  Prednisone Weight gain prediabetes, hemoglobin A1c 5.7 hypertension,  migraines  chest tightness  Obstructive sleep apnea Echo 5/11 normal EF 60 to 65% Leg swelling, on Lasix  who presents for routine followup of her chest pain  emergency room at [facility] for chest pain May 15, 2016 Patient declined admission Hospital records reviewed with the patient in detail She had been traveling back from [place], getting unpacked, sat down, shortness of breath and chest pain CT scan with no PE Cardiac enzymes negative 2 Potassium 2.9 Went to [facility] hot, "burning up"   She is on prednisone taper for PMR  total cholesterol 168, LDL 84 Recent potassium 3.5 Lab work reviewed with her   Not on lasix in one year SOB, 11 pounds  Attributes this to prednisone  CT scan chest from [facility] reviewed with her, showing no coronary calcifications, no PE  EKG personally reviewed by myself on todays visit Shows normal sinus rhythm with rate 68 bpm no significant ST or T-wave changes  Other past medical history reviewed  Echo done in the past  shows normal systolic function, no elevated right ventricular systolic pressure, no significant valvular disease.   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   PMH:   has a past medical history of Allergy; Brain tumor ([place]) (05/2011); Cerebral  aneurysm, nonruptured; Diabetes mellitus; Diffuse cystic mastopathy; Esophageal reflux; Hypertension; Hypopotassemia (06/01/2007); Intervertebral disc disorder with myelopathy, unspecified region; Lupus erythematosus; Migraines; Other convulsions; PLMD (periodic limb movement disorder); S/P bunionectomy (09/28/2013); and Unspecified sleep apnea.  PSH:    Past Surgical History:  Procedure Laterality Date  . BRAIN AVM REPAIR    . BRAIN SURGERY  2013  . BREAST BIOPSY Bilateral    neg  . BREAST CYST ASPIRATION Bilateral   . BREAST SURGERY     x 5 due to fibrocystic disease  . DILATION AND CURETTAGE OF UTERUS     x 5  . FOOT SURGERY    . HEMORRHOID SURGERY    . HERNIA REPAIR    . lumbar surgery  2000  . SPINE SURGERY    . TONSILLECTOMY AND ADENOIDECTOMY    . TOTAL ABDOMINAL HYSTERECTOMY  1979   postpartum hemorrhage; ovaries intact.    Current Outpatient Prescriptions  Medication Sig Dispense Refill  . albuterol (PROVENTIL HFA;VENTOLIN HFA) 108 (90 BASE) MCG/ACT inhaler Inhale 2 puffs into the lungs every 6 (six) hours as needed for wheezing or shortness of breath (cough, shortness of breath or wheezing.). 1 Inhaler 0  . atenolol (TENORMIN) 50 MG tablet Take 1 tablet (50 mg total) by mouth 2 (two) times daily. 180 tablet 1  . cetirizine (ZYRTEC) 10 MG tablet Take 1 tablet (10 mg total) by mouth daily. 90 tablet  3  . Ciclopirox 1 % shampoo LATHER TO SCALP 1-2 TIMES A WEEK. ALLOW TO SIT 5 MINUTES BEFORE RINSING.  1  . clobetasol cream (TEMOVATE) 0.05 % APPLY A THIN LAYER TO THE AFFECTED AREAS TWICE DAILY AS NEEDED FOR A MAX OF 4 WEEKS PER FLARE  1  . clotrimazole (LOTRIMIN) 1 % cream Apply 1 application topically 2 (two) times daily. 30 g 0  . furosemide (LASIX) 40 MG tablet TAKE 1 TABLET (40 MG TOTAL) BY MOUTH DAILY. (Patient taking differently: Take 40 mg by mouth daily as needed. ) 90 tablet 1  . gabapentin (NEURONTIN) 800 MG tablet TAKE 1 TABLET (800 MG TOTAL) BY MOUTH 4 (FOUR) TIMES  DAILY. 360 tablet 1  . HYDROcodone-acetaminophen (NORCO/VICODIN) 5-325 MG tablet Take 1-2 tablets by mouth every 6 (six) hours as needed for moderate pain. 60 tablet 0  . hydroxychloroquine (PLAQUENIL) 200 MG tablet Take 200 mg by mouth 2 (two) times daily.      . hydrOXYzine (ATARAX/VISTARIL) 25 MG tablet TAKE 0.5-1 TABLETS (12.5-25 MG TOTAL) BY MOUTH AT BEDTIME AS NEEDED (INSOMNIA). 30 tablet 2  . lidocaine (LIDODERM) 5 % Place 3 patches onto the skin daily. Remove & Discard patch within 12 hours or as directed by MD 90 patch 3  . mometasone (ELOCON) 0.1 % ointment Apply topically Twice daily as needed.    . nitroGLYCERIN (NITROSTAT) 0.4 MG SL tablet Place 1 tablet (0.4 mg total) under the tongue every 5 (five) minutes as needed for chest pain. 30 tablet 0  . nortriptyline (PAMELOR) 25 MG capsule Take 1 capsule (25 mg total) by mouth 3 (three) times daily. 270 capsule 3  . pantoprazole (PROTONIX) 40 MG tablet TAKE 1 TABLET (40 MG TOTAL) BY MOUTH DAILY. 90 tablet 3  . potassium chloride SA (KLOR-CON M20) 20 MEQ tablet Take 2 tablets (40 mEq total) by mouth 2 (two) times daily. 360 tablet 3  . ranitidine (ZANTAC) 150 MG capsule Take 1 capsule (150 mg total) by mouth 2 (two) times daily. 30 capsule 0  . triamterene-hydrochlorothiazide (MAXZIDE-25) 37.5-25 MG tablet TAKE 1 TABLET BY MOUTH DAILY. 90 tablet 3  . valACYclovir (VALTREX) 1000 MG tablet Take 1 tablet (1,000 mg total) by mouth 3 (three) times daily. 21 tablet 0  . zoster vaccine live, PF, (ZOSTAVAX) 43329 UNT/0.65ML injection Inject 19,400 Units into the skin once. 0.65 mL 0   No current facility-administered medications for this visit.      Allergies:   Lisinopril   Social History:  The patient  reports that she has never smoked. She has never used smokeless tobacco. She reports that she does not drink alcohol or use drugs.   Family History:   family history includes Arthritis in her sister; Breast cancer in her paternal aunt; Cancer  in her other; Cancer (age of onset: 47) in her mother; Coronary artery disease in her other; Crohn's disease in her sister; Diabetes in her daughter; Heart disease in her mother; Heart failure in her mother; Hypertension in her mother and sister; Stroke in her mother.    Review of Systems: Review of Systems  Constitutional: Negative.        Feels hot, flushed  Respiratory: Positive for shortness of breath.   Cardiovascular: Positive for chest pain.  Gastrointestinal: Negative.   Musculoskeletal: Negative.   Neurological: Negative.   Psychiatric/Behavioral: Negative.   All other systems reviewed and are negative.    PHYSICAL EXAM: VS:  BP 110/86 (BP Location: Left Arm, Patient Position:  Sitting, Cuff Size: Normal)   Pulse 68   Ht 5\' 2"  (1.575 m)   Wt 190 lb 8 oz (86.4 kg)   BMI 34.84 kg/m  , BMI Body mass index is 34.84 kg/m. GEN: Well nourished, well developed, in no acute distress , Obese  HEENT: normal  Neck: no JVD, carotid bruits, or masses Cardiac: RRR; no murmurs, rubs, or gallops,no edema  Respiratory:  clear to auscultation bilaterally, normal work of breathing GI: soft, nontender, nondistended, + BS MS: no deformity or atrophy  Skin: warm and dry, no rash Neuro:  Strength and sensation are intact Psych: euthymic mood, full affect    Recent Labs: 07/22/2015: Brain Natriuretic Peptide 15.4 11/05/2015: Hemoglobin 12.4; Magnesium 1.5 02/10/2016: TSH 3.510 05/25/2016: ALT 17; BUN 11; Creatinine, Ser 0.80; Platelets 339; Potassium 3.5; Sodium 141    Lipid Panel Lab Results  Component Value Date   CHOL 168 02/10/2016   HDL 66 02/10/2016   LDLCALC 84 02/10/2016   TRIG 88 02/10/2016      Wt Readings from Last 3 Encounters:  06/07/16 190 lb 8 oz (86.4 kg)  05/25/16 190 lb 3.2 oz (86.3 kg)  02/10/16 179 lb (81.2 kg)       ASSESSMENT AND PLAN:  Atypical angina ([place]) - Plan: EKG 12-Lead No further chest pain Atypical in nature, possibly from low potassium and  spasm Recommended she start potassium daily with her HCTZ Reviewed CT chest with her showing no coronary calcifications She will call us if she has additional episodes of chest pain  HYPERTENSION, BENIGN - Plan: EKG 12-Lead Blood pressure is well controlled on today's visit. No changes made to the medications.  Localized edema - Plan: EKG 12-Lead No significant leg swelling She has not taken Lasix in one year but is on HCTZ daily  OSA on CPAP - Plan: EKG 12-Lead  Glucose intolerance (impaired glucose tolerance) - Plan: EKG 12-Lead  Shortness of breath Etiology unclear, weight up 11 pounds. Recommended she take Lasix with her HCTZ for 3 days for possible fluid retention.   PMR (polymyalgia rheumatica) (HCC)  on prednisone taper per the patient   Disposition:   F/U  6 months   Total encounter time more than 25 minutes  Greater than 50% was spent in counseling and coordination of care with the patient    Orders Placed This Encounter  Procedures  . EKG 12-Lead     Signed, Esmond Plants, M.D., Ph.D. 06/07/2016  [place], [place]

## 2016-06-07 ENCOUNTER — Telehealth: Payer: Self-pay | Admitting: Cardiovascular Disease

## 2016-06-07 ENCOUNTER — Encounter: Payer: Self-pay | Admitting: Cardiovascular Disease

## 2016-06-07 ENCOUNTER — Ambulatory Visit (INDEPENDENT_AMBULATORY_CARE_PROVIDER_SITE_OTHER): Payer: 59 | Admitting: Cardiovascular Disease

## 2016-06-07 ENCOUNTER — Other Ambulatory Visit: Payer: Self-pay

## 2016-06-07 VITALS — BP 110/86 | HR 68 | Ht 62.0 in | Wt 190.5 lb

## 2016-06-07 DIAGNOSIS — M353 Polymyalgia rheumatica: Secondary | ICD-10-CM | POA: Diagnosis not present

## 2016-06-07 DIAGNOSIS — R0602 Shortness of breath: Secondary | ICD-10-CM

## 2016-06-07 DIAGNOSIS — R600 Localized edema: Secondary | ICD-10-CM

## 2016-06-07 DIAGNOSIS — R7302 Impaired glucose tolerance (oral): Secondary | ICD-10-CM | POA: Diagnosis not present

## 2016-06-07 DIAGNOSIS — I208 Other forms of angina pectoris: Secondary | ICD-10-CM | POA: Diagnosis not present

## 2016-06-07 DIAGNOSIS — Z9989 Dependence on other enabling machines and devices: Secondary | ICD-10-CM

## 2016-06-07 DIAGNOSIS — I10 Essential (primary) hypertension: Secondary | ICD-10-CM

## 2016-06-07 DIAGNOSIS — I209 Angina pectoris, unspecified: Secondary | ICD-10-CM

## 2016-06-07 DIAGNOSIS — G4733 Obstructive sleep apnea (adult) (pediatric): Secondary | ICD-10-CM | POA: Diagnosis not present

## 2016-06-07 MED ORDER — FUROSEMIDE 40 MG PO TABS: 40.0000 mg | ORAL_TABLET | Freq: Every day | ORAL | 1 refills | Status: DC

## 2016-06-07 NOTE — Telephone Encounter (Signed)
°*  STAT* If patient is at the pharmacy, call can be transferred to refill team.   1. Which medications need to be refilled? (please list name of each medication and dose if known) lasix 40 mg po daily   2. Which pharmacy/location (including street and city if local pharmacy) is medication to be sent to? [facility] [address]  [place]   3. Do they need a 30 day or 90 day supply?[place]

## 2016-06-07 NOTE — Patient Instructions (Addendum)
Medication Instructions:   Please try triamtere-HCTZ in the am, followed by lasix  30 min later Take with two potassium Try for 3 days  Take potassium 1 to 1/2 pill   Labwork:  No new labs needed  Testing/Procedures:  No further testing at this time   I recommend watching educational videos on topics of interest to you at:       [email or web address]  Enter code: HEARTCARE    Follow-Up: It was a pleasure seeing you in the office today. Please call us if you have new issues that need to be addressed before your next appt.  [phone]  Your physician wants you to follow-up in: 6 months.  You will receive a reminder letter in the mail two months in advance. If you don't receive a letter, please call our office to schedule the follow-up appointment.  If you need a refill on your cardiac medications before your next appointment, please call your pharmacy.

## 2016-06-07 NOTE — Telephone Encounter (Signed)
Requested Prescriptions   Signed Prescriptions Disp Refills  . furosemide (LASIX) 40 MG tablet 90 tablet 1    Sig: Take 1 tablet (40 mg total) by mouth daily.    Authorizing Provider: Minna Merritts    Ordering User: Janan Ridge

## 2016-06-15 ENCOUNTER — Ambulatory Visit: Payer: 59 | Admitting: Family Medicine

## 2016-06-16 ENCOUNTER — Telehealth: Payer: Self-pay | Admitting: Gastroenterology

## 2016-06-16 ENCOUNTER — Other Ambulatory Visit: Payer: Self-pay

## 2016-06-16 DIAGNOSIS — Z1211 Encounter for screening for malignant neoplasm of colon: Secondary | ICD-10-CM

## 2016-06-16 NOTE — Telephone Encounter (Signed)
Gastroenterology Pre-Procedure Review  Request Date: 07/12/16 Requesting Physician: Dr. Allen Norris  PATIENT REVIEW QUESTIONS: The patient responded to the following health history questions as indicated:    1. Are you having any GI issues? no 2. Do you have a personal history of Polyps? no 3. Do you have a family history of Colon Cancer or Polyps? no 4. Diabetes Mellitus? no 5. Joint replacements in the past 12 months?no 6. Major health problems in the past 3 months?no 7. Any artificial heart valves, MVP, or defibrillator?no    MEDICATIONS & ALLERGIES:    Patient reports the following regarding taking any anticoagulation/antiplatelet therapy:   Plavix, Coumadin, Eliquis, Xarelto, Lovenox, Pradaxa, Brilinta, or Effient? no Aspirin? no  Patient confirms/reports the following medications:  Current Outpatient Prescriptions  Medication Sig Dispense Refill  . albuterol (PROVENTIL HFA;VENTOLIN HFA) 108 (90 BASE) MCG/ACT inhaler Inhale 2 puffs into the lungs every 6 (six) hours as needed for wheezing or shortness of breath (cough, shortness of breath or wheezing.). 1 Inhaler 0  . atenolol (TENORMIN) 50 MG tablet Take 1 tablet (50 mg total) by mouth 2 (two) times daily. 180 tablet 1  . cetirizine (ZYRTEC) 10 MG tablet Take 1 tablet (10 mg total) by mouth daily. 90 tablet 3  . Ciclopirox 1 % shampoo LATHER TO SCALP 1-2 TIMES A WEEK. ALLOW TO SIT 5 MINUTES BEFORE RINSING.  1  . clobetasol cream (TEMOVATE) 0.05 % APPLY A THIN LAYER TO THE AFFECTED AREAS TWICE DAILY AS NEEDED FOR A MAX OF 4 WEEKS PER FLARE  1  . clotrimazole (LOTRIMIN) 1 % cream Apply 1 application topically 2 (two) times daily. 30 g 0  . furosemide (LASIX) 40 MG tablet Take 1 tablet (40 mg total) by mouth daily. 90 tablet 1  . gabapentin (NEURONTIN) 800 MG tablet TAKE 1 TABLET (800 MG TOTAL) BY MOUTH 4 (FOUR) TIMES DAILY. 360 tablet 1  . HYDROcodone-acetaminophen (NORCO/VICODIN) 5-325 MG tablet Take 1-2 tablets by mouth every 6 (six)  hours as needed for moderate pain. 60 tablet 0  . hydroxychloroquine (PLAQUENIL) 200 MG tablet Take 200 mg by mouth 2 (two) times daily.      . hydrOXYzine (ATARAX/VISTARIL) 25 MG tablet TAKE 0.5-1 TABLETS (12.5-25 MG TOTAL) BY MOUTH AT BEDTIME AS NEEDED (INSOMNIA). 30 tablet 2  . lidocaine (LIDODERM) 5 % Place 3 patches onto the skin daily. Remove & Discard patch within 12 hours or as directed by MD 90 patch 3  . mometasone (ELOCON) 0.1 % ointment Apply topically Twice daily as needed.    . nitroGLYCERIN (NITROSTAT) 0.4 MG SL tablet Place 1 tablet (0.4 mg total) under the tongue every 5 (five) minutes as needed for chest pain. 30 tablet 0  . nortriptyline (PAMELOR) 25 MG capsule Take 1 capsule (25 mg total) by mouth 3 (three) times daily. 270 capsule 3  . pantoprazole (PROTONIX) 40 MG tablet TAKE 1 TABLET (40 MG TOTAL) BY MOUTH DAILY. 90 tablet 3  . potassium chloride SA (KLOR-CON M20) 20 MEQ tablet Take 2 tablets (40 mEq total) by mouth 2 (two) times daily. 360 tablet 3  . ranitidine (ZANTAC) 150 MG capsule Take 1 capsule (150 mg total) by mouth 2 (two) times daily. 30 capsule 0  . triamterene-hydrochlorothiazide (MAXZIDE-25) 37.5-25 MG tablet TAKE 1 TABLET BY MOUTH DAILY. 90 tablet 3  . valACYclovir (VALTREX) 1000 MG tablet Take 1 tablet (1,000 mg total) by mouth 3 (three) times daily. 21 tablet 0  . zoster vaccine live, PF, (ZOSTAVAX) [id number] UNT/0.65ML  injection Inject 19,400 Units into the skin once. 0.65 mL 0   No current facility-administered medications for this visit.     Patient confirms/reports the following allergies:  Allergies  Allergen Reactions  . Lisinopril Swelling    Face and lips    No orders of the defined types were placed in this encounter.   AUTHORIZATION INFORMATION Primary Insurance: 1D#: Group #:  Secondary Insurance: 1D#: Group #:  SCHEDULE INFORMATION: Date: 07/12/16 Time: Location:[facility]

## 2016-06-16 NOTE — Telephone Encounter (Signed)
Patient received a letter to schedule a procedure. Please call

## 2016-06-17 ENCOUNTER — Other Ambulatory Visit: Payer: Self-pay

## 2016-06-17 DIAGNOSIS — Z1211 Encounter for screening for malignant neoplasm of colon: Secondary | ICD-10-CM

## 2016-06-17 DIAGNOSIS — Z1212 Encounter for screening for malignant neoplasm of rectum: Principal | ICD-10-CM

## 2016-06-22 ENCOUNTER — Encounter: Payer: Self-pay | Admitting: Family Medicine

## 2016-06-24 DIAGNOSIS — L930 Discoid lupus erythematosus: Secondary | ICD-10-CM | POA: Diagnosis not present

## 2016-06-24 DIAGNOSIS — M353 Polymyalgia rheumatica: Secondary | ICD-10-CM | POA: Diagnosis not present

## 2016-06-29 DIAGNOSIS — Z79899 Other long term (current) drug therapy: Secondary | ICD-10-CM | POA: Diagnosis not present

## 2016-06-29 DIAGNOSIS — H524 Presbyopia: Secondary | ICD-10-CM | POA: Diagnosis not present

## 2016-06-29 DIAGNOSIS — H3589 Other specified retinal disorders: Secondary | ICD-10-CM | POA: Diagnosis not present

## 2016-06-29 DIAGNOSIS — M3210 Systemic lupus erythematosus, organ or system involvement unspecified: Secondary | ICD-10-CM | POA: Diagnosis not present

## 2016-06-29 DIAGNOSIS — H5203 Hypermetropia, bilateral: Secondary | ICD-10-CM | POA: Diagnosis not present

## 2016-07-04 ENCOUNTER — Encounter: Payer: Self-pay | Admitting: Family Medicine

## 2016-07-05 ENCOUNTER — Ambulatory Visit: Payer: 59 | Admitting: Family Medicine

## 2016-07-05 ENCOUNTER — Encounter: Payer: Self-pay | Admitting: *Deleted

## 2016-07-05 DIAGNOSIS — D329 Benign neoplasm of meninges, unspecified: Secondary | ICD-10-CM | POA: Diagnosis not present

## 2016-07-05 DIAGNOSIS — Z86011 Personal history of benign neoplasm of the brain: Secondary | ICD-10-CM | POA: Diagnosis not present

## 2016-07-05 DIAGNOSIS — G9389 Other specified disorders of brain: Secondary | ICD-10-CM | POA: Diagnosis not present

## 2016-07-05 NOTE — Progress Notes (Signed)
letter sent

## 2016-07-06 ENCOUNTER — Telehealth: Payer: Self-pay

## 2016-07-06 DIAGNOSIS — D329 Benign neoplasm of meninges, unspecified: Secondary | ICD-10-CM | POA: Diagnosis not present

## 2016-07-06 NOTE — Telephone Encounter (Signed)
Spoke with pt about lab result. Pt has requested a refill on her potassium chloride. [facility]acy is [facility].

## 2016-07-09 NOTE — Discharge Instructions (Signed)
General Anesthesia, Adult, Care After °These instructions provide you with information about caring for yourself after your procedure. Your health care provider may also give you more specific instructions. Your treatment has been planned according to current medical practices, but problems sometimes occur. Call your health care provider if you have any problems or questions after your procedure. °What can I expect after the procedure? °After the procedure, it is common to have: °· Vomiting. °· A sore throat. °· Mental slowness. ° °It is common to feel: °· Nauseous. °· Cold or shivery. °· Sleepy. °· Tired. °· Sore or achy, even in parts of your body where you did not have surgery. ° °Follow these instructions at home: °For at least 24 hours after the procedure: °· Do not: °? Participate in activities where you could fall or become injured. °? Drive. °? Use heavy machinery. °? Drink alcohol. °? Take sleeping pills or medicines that cause drowsiness. °? Make important decisions or sign legal documents. °? Take care of children on your own. °· Rest. °Eating and drinking °· If you vomit, drink water, juice, or soup when you can drink without vomiting. °· Drink enough fluid to keep your urine clear or pale yellow. °· Make sure you have little or no nausea before eating solid foods. °· Follow the diet recommended by your health care provider. °General instructions °· Have a responsible adult stay with you until you are awake and alert. °· Return to your normal activities as told by your health care provider. Ask your health care provider what activities are safe for you. °· Take over-the-counter and prescription medicines only as told by your health care provider. °· If you smoke, do not smoke without supervision. °· Keep all follow-up visits as told by your health care provider. This is important. °Contact a health care provider if: °· You continue to have nausea or vomiting at home, and medicines are not helpful. °· You  cannot drink fluids or start eating again. °· You cannot urinate after 8-12 hours. °· You develop a skin rash. °· You have fever. °· You have increasing redness at the site of your procedure. °Get help right away if: °· You have difficulty breathing. °· You have chest pain. °· You have unexpected bleeding. °· You feel that you are having a life-threatening or urgent problem. °This information is not intended to replace advice given to you by your health care provider. Make sure you discuss any questions you have with your health care provider. °Document Released: 03/29/2000 Document Revised: 05/26/2015 Document Reviewed: 12/05/2014 °Elsevier Interactive Patient Education © 2018 [employer] Inc. ° °

## 2016-07-12 ENCOUNTER — Ambulatory Visit
Admission: RE | Admit: 2016-07-12 | Discharge: 2016-07-12 | Disposition: A | Discharge status: Home / Self Care | Payer: 59 | Source: Ambulatory Visit | Attending: Gastroenterology | Admitting: Gastroenterology

## 2016-07-12 ENCOUNTER — Encounter
Admission: RE | Disposition: A | Discharge status: Home / Self Care | Payer: Self-pay | Source: Ambulatory Visit | Attending: Gastroenterology

## 2016-07-12 ENCOUNTER — Ambulatory Visit: Payer: 59 | Admitting: Anesthesiology

## 2016-07-12 DIAGNOSIS — K219 Gastro-esophageal reflux disease without esophagitis: Secondary | ICD-10-CM | POA: Diagnosis not present

## 2016-07-12 DIAGNOSIS — D120 Benign neoplasm of cecum: Secondary | ICD-10-CM | POA: Diagnosis not present

## 2016-07-12 DIAGNOSIS — I10 Essential (primary) hypertension: Secondary | ICD-10-CM | POA: Insufficient documentation

## 2016-07-12 DIAGNOSIS — E119 Type 2 diabetes mellitus without complications: Secondary | ICD-10-CM | POA: Insufficient documentation

## 2016-07-12 DIAGNOSIS — Z1211 Encounter for screening for malignant neoplasm of colon: Secondary | ICD-10-CM | POA: Diagnosis not present

## 2016-07-12 DIAGNOSIS — K5730 Diverticulosis of large intestine without perforation or abscess without bleeding: Secondary | ICD-10-CM | POA: Diagnosis not present

## 2016-07-12 DIAGNOSIS — L930 Discoid lupus erythematosus: Secondary | ICD-10-CM | POA: Insufficient documentation

## 2016-07-12 DIAGNOSIS — Z90710 Acquired absence of both cervix and uterus: Secondary | ICD-10-CM | POA: Insufficient documentation

## 2016-07-12 DIAGNOSIS — M1990 Unspecified osteoarthritis, unspecified site: Secondary | ICD-10-CM | POA: Insufficient documentation

## 2016-07-12 DIAGNOSIS — D122 Benign neoplasm of ascending colon: Secondary | ICD-10-CM

## 2016-07-12 HISTORY — DX: Presence of dental prosthetic device (complete) (partial): Z97.2

## 2016-07-12 HISTORY — DX: Unspecified osteoarthritis, unspecified site: M19.90

## 2016-07-12 HISTORY — PX: POLYPECTOMY: SHX5525

## 2016-07-12 HISTORY — PX: COLONOSCOPY WITH PROPOFOL: SHX5780

## 2016-07-12 SURGERY — COLONOSCOPY WITH PROPOFOL
Anesthesia: General | Wound class: Contaminated

## 2016-07-12 MED ORDER — STERILE WATER FOR IRRIGATION IR SOLN
Status: DC | PRN
  Administered 2016-07-12: 10:00:00

## 2016-07-12 MED ORDER — LIDOCAINE HCL (CARDIAC) 20 MG/ML IV SOLN
INTRAVENOUS | Status: DC | PRN
  Administered 2016-07-12: 50 mg via INTRAVENOUS

## 2016-07-12 MED ORDER — PROPOFOL 10 MG/ML IV BOLUS
INTRAVENOUS | Status: DC | PRN
  Administered 2016-07-12: 20 mg via INTRAVENOUS
  Administered 2016-07-12: 30 mg via INTRAVENOUS
  Administered 2016-07-12: 70 mg via INTRAVENOUS
  Administered 2016-07-12 (×2): 30 mg via INTRAVENOUS

## 2016-07-12 MED ORDER — LACTATED RINGERS IV SOLN
INTRAVENOUS | Status: DC
  Administered 2016-07-12: 10:00:00 via INTRAVENOUS

## 2016-07-12 SURGICAL SUPPLY — 23 items
CANISTER SUCT 1200ML W/VALVE (MISCELLANEOUS) ×3 IMPLANT
CLIP HMST 235XBRD CATH ROT (MISCELLANEOUS) IMPLANT
CLIP RESOLUTION 360 11X235 (MISCELLANEOUS)
FCP ESCP3.2XJMB 240X2.8X (MISCELLANEOUS)
FORCEPS BIOP RAD 4 LRG CAP 4 (CUTTING FORCEPS) ×1 IMPLANT
FORCEPS BIOP RJ4 240 W/NDL (MISCELLANEOUS)
FORCEPS ESCP3.2XJMB 240X2.8X (MISCELLANEOUS) IMPLANT
GOWN CVR UNV OPN BCK APRN NK (MISCELLANEOUS) ×4 IMPLANT
GOWN ISOL THUMB LOOP REG UNIV (MISCELLANEOUS) ×6
INJECTOR VARIJECT VIN23 (MISCELLANEOUS) IMPLANT
KIT DEFENDO VALVE AND CONN (KITS) IMPLANT
KIT ENDO PROCEDURE OLY (KITS) ×3 IMPLANT
MARKER SPOT ENDO TATTOO 5ML (MISCELLANEOUS) IMPLANT
PAD GROUND ADULT SPLIT (MISCELLANEOUS) IMPLANT
PROBE APC STR FIRE (PROBE) IMPLANT
RETRIEVER NET ROTH 2.5X230 LF (MISCELLANEOUS) IMPLANT
SNARE SHORT THROW 13M SML OVAL (MISCELLANEOUS) ×2 IMPLANT
SNARE SHORT THROW 30M LRG OVAL (MISCELLANEOUS) IMPLANT
SNARE SNG USE RND 15MM (INSTRUMENTS) IMPLANT
SPOT EX ENDOSCOPIC TATTOO (MISCELLANEOUS)
TRAP ETRAP POLY (MISCELLANEOUS) ×2 IMPLANT
VARIJECT INJECTOR VIN23 (MISCELLANEOUS)
WATER STERILE IRR 250ML POUR (IV SOLUTION) ×3 IMPLANT

## 2016-07-12 NOTE — Anesthesia Preprocedure Evaluation (Signed)
Anesthesia Evaluation    Airway Mallampati: II  TM Distance: >3 FB Neck ROM: Full    Dental no notable dental hx.    Pulmonary neg pulmonary ROS, sleep apnea ,    Pulmonary exam normal breath sounds clear to auscultation       Cardiovascular hypertension, Normal cardiovascular exam Rhythm:Regular Rate:Normal     Neuro/Psych  Headaches,    GI/Hepatic GERD  Medicated and Controlled,  Endo/Other  diabetes  Renal/GU      Musculoskeletal  (+) Arthritis ,   Abdominal   Peds  Hematology  (+) anemia ,   Anesthesia Other Findings Lupus  Reproductive/Obstetrics                             Anesthesia Physical Anesthesia Plan  ASA: II  Anesthesia Plan: General   Post-op Pain Management:    Induction: Intravenous  PONV Risk Score and Plan: Propofol  Airway Management Planned:   Additional Equipment:   Intra-op Plan:   Post-operative Plan: Extubation in OR  Informed Consent: I have reviewed the patients History and Physical, chart, labs and discussed the procedure including the risks, benefits and alternatives for the proposed anesthesia with the patient or authorized representative who has indicated his/her understanding and acceptance.   Dental advisory given  Plan Discussed with: CRNA  Anesthesia Plan Comments:         Anesthesia Quick Evaluation

## 2016-07-12 NOTE — Anesthesia Postprocedure Evaluation (Signed)
Anesthesia Post Note  Patient: Maureen Hodge  Procedure(s) Performed: Procedure(s) (LRB): COLONOSCOPY WITH PROPOFOL (N/A) POLYPECTOMY  Patient location during evaluation: PACU Anesthesia Type: General Level of consciousness: awake and alert Pain management: pain level controlled Vital Signs Assessment: post-procedure vital signs reviewed and stable Respiratory status: spontaneous breathing, nonlabored ventilation, respiratory function stable and patient connected to nasal cannula oxygen Cardiovascular status: blood pressure returned to baseline and stable Postop Assessment: no signs of nausea or vomiting Anesthetic complications: no    Fatina Sprankle C

## 2016-07-12 NOTE — Op Note (Signed)
[facility] Gastroenterology Patient Name: Maureen Hodge Procedure Date: 07/12/2016 10:10 AM MRN: [id number] Account #: [id number] Date of Birth: 04/01/1950 Admit Type: Outpatient Age: 66 Room: [facility] OR ROOM 01 Gender: Female Note Status: Finalized Procedure:            Colonoscopy Indications:          Screening for colorectal malignant neoplasm Providers:            Lucilla Lame MD, MD Referring MD:         Renette Butters. Tamala Julian, MD (Referring MD) Medicines:            Propofol per Anesthesia Complications:        No immediate complications. Procedure:            Pre-Anesthesia Assessment:                       - Prior to the procedure, a History and Physical was                        performed, and patient medications and allergies were                        reviewed. The patient's tolerance of previous                        anesthesia was also reviewed. The risks and benefits of                        the procedure and the sedation options and risks were                        discussed with the patient. All questions were                        answered, and informed consent was obtained. Prior                        Anticoagulants: The patient has taken no previous                        anticoagulant or antiplatelet agents. ASA Grade                        Assessment: II - A patient with mild systemic disease.                        After reviewing the risks and benefits, the patient was                        deemed in satisfactory condition to undergo the                        procedure.                       After obtaining informed consent, the colonoscope was                        passed under direct vision. Throughout the procedure,  the patient's blood pressure, pulse, and oxygen                        saturations were monitored continuously. The Olympus                        Colonoscope 190 [id number]) was introduced through the                         anus and advanced to the the cecum, identified by                        appendiceal orifice and ileocecal valve. The                        colonoscopy was performed without difficulty. The                        patient tolerated the procedure well. The quality of                        the bowel preparation was excellent. Findings:      The perianal and digital rectal examinations were normal.      A 9 mm polyp was found in the cecum. The polyp was sessile. The polyp       was removed with a cold snare. Resection and retrieval were complete.      Three sessile polyps were found in the ascending colon. The polyps were       2 to 6 mm in size. These polyps were removed with a cold snare.       Resection and retrieval were complete.      A few small-mouthed diverticula were found in the entire colon. Impression:           - One 9 mm polyp in the cecum, removed with a cold                        snare. Resected and retrieved.                       - Three 2 to 6 mm polyps in the ascending colon,                        removed with a cold snare. Resected and retrieved.                       - Diverticulosis in the entire examined colon. Recommendation:       - Discharge patient to home.                       - Resume previous diet.                       - Continue present medications.                       - Repeat colonoscopy in 5 years if polyp adenoma and 10                        years if hyperplastic Procedure Code(s):    ---  Professional ---                       [phone], Colonoscopy, flexible; with removal of tumor(s),                        polyp(s), or other lesion(s) by snare technique Diagnosis Code(s):    --- Professional ---                       Z12.11, Encounter for screening for malignant neoplasm                        of colon                       D12.0, Benign neoplasm of cecum                       D12.2, Benign neoplasm of ascending colon CPT  copyright 2016 American Medical Association. All rights reserved. The codes documented in this report are preliminary and upon coder review may  be revised to meet current compliance requirements. Lucilla Lame MD, MD 07/12/2016 10:32:23 AM This report has been signed electronically. Number of Addenda: 0 Note Initiated On: 07/12/2016 10:10 AM Scope Withdrawal Time: 0 hours 8 minutes 31 seconds  Total Procedure Duration: 0 hours 10 minutes 36 seconds       [place]

## 2016-07-12 NOTE — Anesthesia Procedure Notes (Signed)
Performed by: Korin Setzler Pre-anesthesia Checklist: Patient identified, Emergency Drugs available, Suction available, Timeout performed and Patient being monitored Patient Re-evaluated:Patient Re-evaluated prior to induction Oxygen Delivery Method: Nasal cannula Placement Confirmation: positive ETCO2       

## 2016-07-12 NOTE — Transfer of Care (Signed)
Immediate Anesthesia Transfer of Care Note  Patient: Maureen Hodge  Procedure(s) Performed: Procedure(s) with comments: COLONOSCOPY WITH PROPOFOL (N/A) - sleep apnea POLYPECTOMY  Patient Location: PACU  Anesthesia Type: General  Level of Consciousness: awake, alert  and patient cooperative  Airway and Oxygen Therapy: Patient Spontanous Breathing and Patient connected to supplemental oxygen  Post-op Assessment: Post-op Vital signs reviewed, Patient's Cardiovascular Status Stable, Respiratory Function Stable, Patent Airway and No signs of Nausea or vomiting  Post-op Vital Signs: Reviewed and stable  Complications: No apparent anesthesia complications

## 2016-07-12 NOTE — H&P (Signed)
Lucilla Lame, MD [facility] [address], [place], [place] [address] Phone: [phone] Fax : [phone]  Primary Care Physician:  Wardell Honour, MD Primary Gastroenterologist:  Dr. Allen Norris  Pre-Procedure History & Physical: HPI:  Maureen Hodge is a 66 y.o. female is here for a screening colonoscopy.   Past Medical History:  Diagnosis Date  . Allergy   . Arthritis   . Brain tumor ([place]) 05/2011   s/p resection  [facility].  [name] Cerebral aneurysm, nonruptured   . Diabetes mellitus    Pre-diabetes  . Diffuse cystic mastopathy   . Esophageal reflux   . Hypertension   . Hypopotassemia 06/01/2007  . Intervertebral disc disorder with myelopathy, unspecified region   . Lupus erythematosus   . Migraines   . Other convulsions   . PLMD (periodic limb movement disorder)   . Presence of dental bridge    upper, permanent  . S/P bunionectomy 09/28/2013   [facility].  Right.  [name] Unspecified sleep apnea    CPAP titrated to 7cm    Past Surgical History:  Procedure Laterality Date  . BRAIN AVM REPAIR    . BRAIN SURGERY  2013  . BREAST BIOPSY Bilateral    neg  . BREAST CYST ASPIRATION Bilateral   . BREAST SURGERY     x 5 due to fibrocystic disease  . DILATION AND CURETTAGE OF UTERUS     x 5  . FOOT SURGERY    . HEMORRHOID SURGERY    . HERNIA REPAIR    . lumbar surgery  2000  . SPINE SURGERY    . TONSILLECTOMY AND ADENOIDECTOMY    . TOTAL ABDOMINAL HYSTERECTOMY  1979   postpartum hemorrhage; ovaries intact.    Prior to Admission medications   Medication Sig Start Date End Date Taking? Authorizing Provider  atenolol (TENORMIN) 50 MG tablet Take 1 tablet (50 mg total) by mouth 2 (two) times daily. 10/09/15  Yes Wardell Honour, MD  cetirizine (ZYRTEC) 10 MG tablet Take 1 tablet (10 mg total) by mouth daily. 03/09/16  Yes Wardell Honour, MD  Ciclopirox 1 % shampoo LATHER TO SCALP 1-2 TIMES A WEEK. ALLOW TO SIT 5 MINUTES BEFORE RINSING. 06/05/15  Yes [provider]    clobetasol cream (TEMOVATE) 0.05 % APPLY A THIN LAYER TO THE AFFECTED AREAS TWICE DAILY AS NEEDED FOR A MAX OF 4 WEEKS PER FLARE 06/13/15  Yes [provider]  clotrimazole (LOTRIMIN) 1 % cream Apply 1 application topically 2 (two) times daily. 08/26/15  Yes Weber, Sarah L, PA-C  furosemide (LASIX) 40 MG tablet Take 1 tablet (40 mg total) by mouth daily. 06/07/16  Yes Gollan, Kathlene November, MD  gabapentin (NEURONTIN) 800 MG tablet TAKE 1 TABLET (800 MG TOTAL) BY MOUTH 4 (FOUR) TIMES DAILY. 03/27/16  Yes Wardell Honour, MD  HYDROcodone-acetaminophen (NORCO/VICODIN) 5-325 MG tablet Take 1-2 tablets by mouth every 6 (six) hours as needed for moderate pain. 02/10/16  Yes Wardell Honour, MD  hydroxychloroquine (PLAQUENIL) 200 MG tablet Take 200 mg by mouth 2 (two) times daily.     Yes [provider]  hydrOXYzine (ATARAX/VISTARIL) 25 MG tablet TAKE 0.5-1 TABLETS (12.5-25 MG TOTAL) BY MOUTH AT BEDTIME AS NEEDED (INSOMNIA). 03/10/16  Yes Jeffery, Chelle, PA-C  lidocaine (LIDODERM) 5 % Place 3 patches onto the skin daily. Remove & Discard patch within 12 hours or as directed by MD 08/25/15  Yes Wardell Honour, MD  mometasone (ELOCON) 0.1 % ointment  Apply topically Twice daily as needed. 11/09/11  Yes [provider]  nitroGLYCERIN (NITROSTAT) 0.4 MG SL tablet Place 1 tablet (0.4 mg total) under the tongue every 5 (five) minutes as needed for chest pain. 01/23/13  Yes Wardell Honour, MD  nortriptyline (PAMELOR) 25 MG capsule Take 1 capsule (25 mg total) by mouth 3 (three) times daily. 11/10/15  Yes Wardell Honour, MD  pantoprazole (PROTONIX) 40 MG tablet TAKE 1 TABLET (40 MG TOTAL) BY MOUTH DAILY. 11/30/15  Yes Wardell Honour, MD  potassium chloride SA (KLOR-CON M20) 20 MEQ tablet Take 2 tablets (40 mEq total) by mouth 2 (two) times daily. 07/10/14  Yes Wardell Honour, MD  triamterene-hydrochlorothiazide (MAXZIDE-25) 37.5-25 MG tablet TAKE 1 TABLET BY MOUTH DAILY. 10/24/15  Yes Wardell Honour,  MD  albuterol (PROVENTIL HFA;VENTOLIN HFA) 108 (90 BASE) MCG/ACT inhaler Inhale 2 puffs into the lungs every 6 (six) hours as needed for wheezing or shortness of breath (cough, shortness of breath or wheezing.). Patient not taking: Reported on 07/05/2016 12/12/13   Wardell Honour, MD    Allergies as of 06/17/2016 - Review Complete 06/07/2016  Allergen Reaction Noted  . Lisinopril Swelling 03/20/2009    Family History  Problem Relation Age of Onset  . Heart disease Mother        CHF  . Heart failure Mother        CHF  . Hypertension Mother   . Stroke Mother   . Cancer Mother 50       unknown primary  . Hypertension Sister   . Crohn's disease Sister   . Arthritis Sister        partial knee replacement.  . Diabetes Daughter   . Coronary artery disease Other        family history  . Cancer Other        family history  . Breast cancer Paternal Aunt     Social History   Social History  . Marital status: Married    Spouse name: N/A  . Number of children: 2  . Years of education: N/A   Occupational History  . Retired    Social History Main Topics  . Smoking status: Never Smoker  . Smokeless tobacco: Never Used  . Alcohol use No  . Drug use: No  . Sexual activity: Yes   Other Topics Concern  . Not on file   Social History Narrative   Marital status:  Married  X 46 years, happily , no abuse.       Children:  2 children; 4 grandchildren.      Lives: with husband.      Employment: disability.  Works part-time at [employer]; working PRN;.      Tobacco: never      Alcohol: wine once per month; 2-3 times per month      Drugs: none     Exercise: none     Caffeine Use: Some.    Seatbelt:  Always uses seat belts.       Smoke alarm in the home.   Exercise: Some    Review of Systems: See HPI, otherwise negative ROS  Physical Exam: BP 118/88   Pulse 64   Temp 97.7 F (36.5 C) (Temporal)   Ht 5\' 2"  (1.575 m)   Wt 189 lb (85.7 kg)   SpO2 100%   BMI 34.57 kg/m   General:   Alert,  pleasant and cooperative in NAD Head:  Normocephalic and  atraumatic. Neck:  Supple; no masses or thyromegaly. Lungs:  Clear throughout to auscultation.    Heart:  Regular rate and rhythm. Abdomen:  Soft, nontender and nondistended. Normal bowel sounds, without guarding, and without rebound.   Neurologic:  Alert and  oriented x4;  grossly normal neurologically.  Impression/Plan: Maureen Hodge is now here to undergo a screening colonoscopy.  Risks, benefits, and alternatives regarding colonoscopy have been reviewed with the patient.  Questions have been answered.  All parties agreeable.

## 2016-07-13 ENCOUNTER — Other Ambulatory Visit: Payer: Self-pay | Admitting: Physician Assistant

## 2016-07-14 ENCOUNTER — Encounter: Payer: Self-pay | Admitting: Gastroenterology

## 2016-07-28 ENCOUNTER — Ambulatory Visit (INDEPENDENT_AMBULATORY_CARE_PROVIDER_SITE_OTHER): Payer: 59 | Admitting: Family Medicine

## 2016-07-28 ENCOUNTER — Ambulatory Visit (INDEPENDENT_AMBULATORY_CARE_PROVIDER_SITE_OTHER): Payer: 59

## 2016-07-28 ENCOUNTER — Encounter: Payer: Self-pay | Admitting: Family Medicine

## 2016-07-28 VITALS — BP 128/82 | HR 66 | Temp 98.0°F | Resp 18 | Ht 61.42 in | Wt 193.0 lb

## 2016-07-28 DIAGNOSIS — E119 Type 2 diabetes mellitus without complications: Secondary | ICD-10-CM | POA: Diagnosis not present

## 2016-07-28 DIAGNOSIS — E876 Hypokalemia: Secondary | ICD-10-CM

## 2016-07-28 DIAGNOSIS — D120 Benign neoplasm of cecum: Secondary | ICD-10-CM

## 2016-07-28 DIAGNOSIS — I10 Essential (primary) hypertension: Secondary | ICD-10-CM | POA: Diagnosis not present

## 2016-07-28 DIAGNOSIS — M353 Polymyalgia rheumatica: Secondary | ICD-10-CM

## 2016-07-28 DIAGNOSIS — K219 Gastro-esophageal reflux disease without esophagitis: Secondary | ICD-10-CM | POA: Diagnosis not present

## 2016-07-28 DIAGNOSIS — R109 Unspecified abdominal pain: Secondary | ICD-10-CM | POA: Diagnosis not present

## 2016-07-28 DIAGNOSIS — I208 Other forms of angina pectoris: Secondary | ICD-10-CM

## 2016-07-28 DIAGNOSIS — D122 Benign neoplasm of ascending colon: Secondary | ICD-10-CM | POA: Diagnosis not present

## 2016-07-28 DIAGNOSIS — R61 Generalized hyperhidrosis: Secondary | ICD-10-CM

## 2016-07-28 LAB — POCT URINALYSIS DIP (MANUAL ENTRY)
Bilirubin, UA: NEGATIVE
Blood, UA: NEGATIVE
Glucose, UA: NEGATIVE mg/dL
Ketones, POC UA: NEGATIVE mg/dL
LEUKOCYTES UA: NEGATIVE
NITRITE UA: NEGATIVE
PH UA: 7 (ref 5.0–8.0)
PROTEIN UA: NEGATIVE mg/dL
Spec Grav, UA: 1.02 (ref 1.010–1.025)
Urobilinogen, UA: 0.2 E.U./dL

## 2016-07-28 MED ORDER — HYDROCODONE-ACETAMINOPHEN 5-325 MG PO TABS: 1.0000 | ORAL_TABLET | Freq: Four times a day (QID) | ORAL | 0 refills | Status: DC | PRN

## 2016-07-28 MED ORDER — POTASSIUM CHLORIDE CRYS ER 20 MEQ PO TBCR: 40.0000 meq | EXTENDED_RELEASE_TABLET | Freq: Every day | ORAL | 3 refills | Status: DC

## 2016-07-28 NOTE — Patient Instructions (Signed)
     IF you received an x-ray today, you will receive an invoice from [facility]. Please contact [facility] at [phone] with questions or concerns regarding your invoice.   IF you received labwork today, you will receive an invoice from [facility]. Please contact [facility] at [phone] with questions or concerns regarding your invoice.   Our billing staff will not be able to assist you with questions regarding bills from these companies.  You will be contacted with the lab results as soon as they are available. The fastest way to get your results is to activate your My Chart account. Instructions are located on the last page of this paperwork. If you have not heard from us regarding the results in 2 weeks, please contact this office.     

## 2016-07-28 NOTE — Progress Notes (Signed)
Subjective:    Patient ID: Maureen Hodge, female    DOB: Aug 17, 1950, 66 y.o.   MRN: [id number]  07/28/2016  Hypertension (3 month follow-up ); Gastroesophageal Reflux; and Back Pain (want to be checked for shingals. )   HPI This 66 y.o. female presents for two month follow-up of chest tightness, PMR, diaphoresis.  Chest tightness: no stress testing; return if anything returns.  Treated hypokalemia.  Not completely resolved; still present.  Can occur at rest; feels tightening.  Coming home Friday last week, chest tightness.  No acute worsening of sweating with chest tightness.  Excessive sweating: continues to sweat excessively.  Taking two daily.  Takes two weeks to work; just started.    PMR: decreased to 15mg  daily by rheumatology.  Transitioning to [facility].  R lower back pain: skin sensitivity on RIGHT for two weeks.    S/p colonoscopy; three small polyps and one large polyp; repeat in 5 years.    Cannot tolerate Lasix daily.  Too much.  Taking Lasix PRN; hates to take it.   Added Zantac lat visit; some improvement.    Feels about the same.    Hemoglobin A1c 6.7.    BP Readings from Last 3 Encounters:  07/28/16 128/82  07/12/16 (!) 126/42  06/07/16 110/86   Wt Readings from Last 3 Encounters:  07/28/16 193 lb (87.5 kg)  07/12/16 189 lb (85.7 kg)  06/07/16 190 lb 8 oz (86.4 kg)   Immunization History  Administered Date(s) Administered  . Influenza,inj,Quad PF,36+ Mos 10/23/2012, 09/24/2013, 11/15/2014, 11/05/2015  . Tdap 10/05/2010     Review of Systems  Constitutional: Positive for diaphoresis. Negative for chills, fatigue and fever.  Eyes: Negative for visual disturbance.  Respiratory: Negative for cough and shortness of breath.   Cardiovascular: Negative for chest pain, palpitations and leg swelling.  Gastrointestinal: Negative for abdominal pain, constipation, diarrhea, nausea and vomiting.  Endocrine: Negative for cold intolerance, heat  intolerance, polydipsia, polyphagia and polyuria.  Musculoskeletal: Positive for arthralgias, back pain and myalgias.  Neurological: Negative for dizziness, tremors, seizures, syncope, facial asymmetry, speech difficulty, weakness, light-headedness, numbness and headaches.    Past Medical History:  Diagnosis Date  . Allergy   . Arthritis   . Brain tumor ([place]) 05/2011   s/p resection  [facility].  [name] Cerebral aneurysm, nonruptured   . Diabetes mellitus    Pre-diabetes  . Diffuse cystic mastopathy   . Esophageal reflux   . Hypertension   . Hypopotassemia 06/01/2007  . Intervertebral disc disorder with myelopathy, unspecified region   . Lupus erythematosus   . Migraines   . Other convulsions   . PLMD (periodic limb movement disorder)   . Presence of dental bridge    upper, permanent  . S/P bunionectomy 09/28/2013   [facility].  Right.  [name] Unspecified sleep apnea    CPAP titrated to 7cm   Past Surgical History:  Procedure Laterality Date  . BRAIN AVM REPAIR    . BRAIN SURGERY  2013  . BREAST BIOPSY Bilateral    neg  . BREAST CYST ASPIRATION Bilateral   . BREAST SURGERY     x 5 due to fibrocystic disease  . COLONOSCOPY WITH PROPOFOL N/A 07/12/2016   Procedure: COLONOSCOPY WITH PROPOFOL;  Surgeon: Lucilla Lame, MD;  Location: [facility];  Service: Endoscopy;  Laterality: N/A;  sleep apnea  . DILATION AND CURETTAGE OF UTERUS     x 5  . FOOT SURGERY    . HEMORRHOID SURGERY    .  HERNIA REPAIR    . lumbar surgery  2000  . POLYPECTOMY  07/12/2016   Procedure: POLYPECTOMY;  Surgeon: Lucilla Lame, MD;  Location: [facility];  Service: Endoscopy;;  . SPINE SURGERY    . TONSILLECTOMY AND ADENOIDECTOMY    . TOTAL ABDOMINAL HYSTERECTOMY  1979   postpartum hemorrhage; ovaries intact.   Allergies  Allergen Reactions  . Lisinopril Swelling    Face and lips    Social History   Social History  . Marital status: Married    Spouse name: N/A  . Number of children: 2    . Years of education: N/A   Occupational History  . Retired    Social History Main Topics  . Smoking status: Never Smoker  . Smokeless tobacco: Never Used  . Alcohol use No  . Drug use: No  . Sexual activity: Yes   Other Topics Concern  . Not on file   Social History Narrative   Marital status:  Married  X 46 years, happily , no abuse.       Children:  2 children; 4 grandchildren.      Lives: with husband.      Employment: disability.  Works part-time at [employer]; working PRN;.      Tobacco: never      Alcohol: wine once per month; 2-3 times per month      Drugs: none     Exercise: none     Caffeine Use: Some.    Seatbelt:  Always uses seat belts.       Smoke alarm in the home.   Exercise: Some   Family History  Problem Relation Age of Onset  . Heart disease Mother        CHF  . Heart failure Mother        CHF  . Hypertension Mother   . Stroke Mother   . Cancer Mother 45       unknown primary  . Hypertension Sister   . Crohn's disease Sister   . Arthritis Sister        partial knee replacement.  . Diabetes Daughter   . Coronary artery disease Other        family history  . Cancer Other        family history  . Breast cancer Paternal Aunt        Objective:    BP 128/82   Pulse 66   Temp 98 F (36.7 C) (Oral)   Resp 18   Ht 5' 1.42" (1.56 m)   Wt 193 lb (87.5 kg)   SpO2 97%   BMI 35.97 kg/m  Physical Exam  Constitutional: She is oriented to person, place, and time. She appears well-developed and well-nourished. No distress.  HENT:  Head: Normocephalic and atraumatic.  Right Ear: External ear normal.  Left Ear: External ear normal.  Nose: Nose normal.  Mouth/Throat: Oropharynx is clear and moist.  Eyes: Pupils are equal, round, and reactive to light. Conjunctivae and EOM are normal.  Neck: Normal range of motion. Neck supple. Carotid bruit is not present. No thyromegaly present.  Cardiovascular: Normal rate, regular rhythm, normal heart  sounds and intact distal pulses.  Exam reveals no gallop and no friction rub.   No murmur heard. Pulmonary/Chest: Effort normal and breath sounds normal. She has no wheezes. She has no rales.  Abdominal: Soft. Bowel sounds are normal. She exhibits no distension and no mass. There is no tenderness. There is no rebound and  no guarding.  Musculoskeletal:       Lumbar back: She exhibits decreased range of motion, tenderness and pain. She exhibits no bony tenderness and no spasm.  Lymphadenopathy:    She has no cervical adenopathy.  Neurological: She is alert and oriented to person, place, and time. No cranial nerve deficit.  Skin: Skin is warm and dry. No rash noted. She is not diaphoretic. No erythema. No pallor.  Psychiatric: She has a normal mood and affect. Her behavior is normal.        Assessment & Plan:   1. Right flank pain   2. Type 2 diabetes mellitus without complication, without long-term current use of insulin ([place])   3. Hypokalemia   4. Essential hypertension   5. Gastroesophageal reflux disease without esophagitis   6. Excessive sweating   7. PMR (polymyalgia rheumatica) ([facility])   8. Benign neoplasm of ascending colon   9. Benign neoplasm of cecum    -new onset R flank pain; pain is similar to Zoster pain yet no evidence of rash; obtain LS spine films; treat supportively with rest, stretches, avoiding lifting greater than ten pounds.  Hydrocodone for pain. -new onset DMII mild due to weight gain and excessive prednisone in past two years.    I recommend weight loss, exercise, and low-carbohydrate low-sugar food choices. You should AVOID: regular sodas, sweetened tea, fruit juices.  You should LIMIT: breads, pastas, rice, potatoes, and desserts/sweets.  I would recommend limiting your total carbohydrate intake per meal to 45 grams; I would limit your total carbohydrate intake per snack to 30 grams.  I would also have a goal of 60 grams of protein intake per day; this would equal  10-15 grams of protein per meal and 5-10 grams of protein per snack. -persistent hypokalemia due to diuretic use; refill provided. -chronic excessive sweating with worsening with daily prednisone use.  Recommend weight loss as well. Recent TSH normal.   -PMR stable; weaning prednisone slowly per rheumatology. -s/p colonoscopy with polyps.   Orders Placed This Encounter  Procedures  . Urine Culture    Order Specific Question:   Source    Answer:   clean catch urine  . DG Lumbar Spine Complete    Standing Status:   Future    Number of Occurrences:   1    Standing Expiration Date:   07/28/2017    Order Specific Question:   Reason for Exam (SYMPTOM  OR DIAGNOSIS REQUIRED)    Answer:   R lower back pain    Order Specific Question:   Preferred imaging location?    Answer:   External  . Comprehensive metabolic panel  . POCT urinalysis dipstick   Meds ordered this encounter  Medications  . potassium chloride SA (KLOR-CON M20) 20 MEQ tablet    Sig: Take 2 tablets (40 mEq total) by mouth daily.    Dispense:  360 tablet    Refill:  3  . HYDROcodone-acetaminophen (NORCO/VICODIN) 5-325 MG tablet    Sig: Take 1-2 tablets by mouth every 6 (six) hours as needed for moderate pain.    Dispense:  60 tablet    Refill:  0    Return in about 2 months (around 09/28/2016) for recheck.   Kartik Fernando Elayne Guerin, M.D. Primary Care at [facility] [address] [place], [place]  [address] [phone] phone [phone] fax

## 2016-07-29 LAB — COMPREHENSIVE METABOLIC PANEL
A/G RATIO: 1.8 (ref 1.2–2.2)
ALT: 16 IU/L (ref 0–32)
AST: 26 IU/L (ref 0–40)
Albumin: 4.2 g/dL (ref 3.6–4.8)
Alkaline Phosphatase: 67 IU/L (ref 39–117)
BILIRUBIN TOTAL: 0.8 mg/dL (ref 0.0–1.2)
BUN/Creatinine Ratio: 14 (ref 12–28)
BUN: 12 mg/dL (ref 8–27)
CALCIUM: 9.3 mg/dL (ref 8.7–10.3)
CHLORIDE: 98 mmol/L (ref 96–106)
CO2: 28 mmol/L (ref 20–29)
Creatinine, Ser: 0.83 mg/dL (ref 0.57–1.00)
GFR calc Af Amer: 87 mL/min/{1.73_m2} (ref >59)
GFR calc non Af Amer: 75 mL/min/{1.73_m2} (ref >59)
GLUCOSE: 110 mg/dL — AB (ref 65–99)
Globulin, Total: 2.3 g/dL (ref 1.5–4.5)
POTASSIUM: 3.6 mmol/L (ref 3.5–5.2)
Sodium: 144 mmol/L (ref 134–144)
Total Protein: 6.5 g/dL (ref 6.0–8.5)

## 2016-07-29 LAB — URINE CULTURE

## 2016-08-03 ENCOUNTER — Telehealth: Payer: Self-pay | Admitting: Family Medicine

## 2016-08-03 ENCOUNTER — Ambulatory Visit: Payer: 59 | Admitting: Family Medicine

## 2016-08-03 ENCOUNTER — Other Ambulatory Visit: Payer: Self-pay | Admitting: Family Medicine

## 2016-08-03 NOTE — Telephone Encounter (Signed)
PT CALLING FOR A REFILL ON GABAPENTIN PLEASE SEND TO [facility]

## 2016-08-03 NOTE — Telephone Encounter (Signed)
PT CALLING FOR A REFILL ON GABAPENTIN

## 2016-08-05 NOTE — Telephone Encounter (Signed)
According to Med list. Gabapentin filled yesterday. 08/04/16

## 2016-09-01 DIAGNOSIS — M353 Polymyalgia rheumatica: Secondary | ICD-10-CM | POA: Diagnosis not present

## 2016-09-01 DIAGNOSIS — L932 Other local lupus erythematosus: Secondary | ICD-10-CM | POA: Diagnosis not present

## 2016-09-01 DIAGNOSIS — Z13820 Encounter for screening for osteoporosis: Secondary | ICD-10-CM | POA: Diagnosis not present

## 2016-09-01 DIAGNOSIS — Z79899 Other long term (current) drug therapy: Secondary | ICD-10-CM | POA: Diagnosis not present

## 2016-09-01 DIAGNOSIS — D329 Benign neoplasm of meninges, unspecified: Secondary | ICD-10-CM | POA: Diagnosis not present

## 2016-09-27 ENCOUNTER — Telehealth: Payer: Self-pay

## 2016-09-27 NOTE — Telephone Encounter (Signed)
Called pt to schedule Medicare Annual Wellness Visit. Pt lives over an hour away and can only do appointment if she already has appointment scheduled with provider.    Maureen Hodge, B.A.  Care Guide [phone]

## 2016-09-28 ENCOUNTER — Ambulatory Visit: Payer: 59 | Admitting: Family Medicine

## 2016-09-30 DIAGNOSIS — M353 Polymyalgia rheumatica: Secondary | ICD-10-CM | POA: Diagnosis not present

## 2016-10-03 ENCOUNTER — Other Ambulatory Visit: Payer: Self-pay | Admitting: Family Medicine

## 2016-10-04 ENCOUNTER — Encounter: Payer: Self-pay | Admitting: Family Medicine

## 2016-10-04 ENCOUNTER — Ambulatory Visit (INDEPENDENT_AMBULATORY_CARE_PROVIDER_SITE_OTHER): Payer: 59 | Admitting: Family Medicine

## 2016-10-04 VITALS — BP 130/82 | HR 73 | Temp 98.0°F | Resp 16 | Ht 62.21 in | Wt 197.0 lb

## 2016-10-04 DIAGNOSIS — Z1231 Encounter for screening mammogram for malignant neoplasm of breast: Secondary | ICD-10-CM | POA: Diagnosis not present

## 2016-10-04 DIAGNOSIS — M79605 Pain in left leg: Secondary | ICD-10-CM | POA: Diagnosis not present

## 2016-10-04 DIAGNOSIS — Z1239 Encounter for other screening for malignant neoplasm of breast: Secondary | ICD-10-CM

## 2016-10-04 DIAGNOSIS — Z23 Encounter for immunization: Secondary | ICD-10-CM

## 2016-10-04 DIAGNOSIS — E119 Type 2 diabetes mellitus without complications: Secondary | ICD-10-CM

## 2016-10-04 DIAGNOSIS — M48061 Spinal stenosis, lumbar region without neurogenic claudication: Secondary | ICD-10-CM | POA: Diagnosis not present

## 2016-10-04 DIAGNOSIS — I10 Essential (primary) hypertension: Secondary | ICD-10-CM

## 2016-10-04 DIAGNOSIS — E661 Drug-induced obesity: Secondary | ICD-10-CM | POA: Diagnosis not present

## 2016-10-04 DIAGNOSIS — Z6835 Body mass index (BMI) 35.0-35.9, adult: Secondary | ICD-10-CM

## 2016-10-04 DIAGNOSIS — M353 Polymyalgia rheumatica: Secondary | ICD-10-CM | POA: Diagnosis not present

## 2016-10-04 DIAGNOSIS — L930 Discoid lupus erythematosus: Secondary | ICD-10-CM

## 2016-10-04 DIAGNOSIS — I208 Other forms of angina pectoris: Secondary | ICD-10-CM

## 2016-10-04 LAB — COMPREHENSIVE METABOLIC PANEL
ALT: 13 IU/L (ref 0–32)
AST: 19 IU/L (ref 0–40)
Albumin/Globulin Ratio: 1.9 (ref 1.2–2.2)
Albumin: 4.2 g/dL (ref 3.6–4.8)
Alkaline Phosphatase: 65 IU/L (ref 39–117)
BILIRUBIN TOTAL: 0.4 mg/dL (ref 0.0–1.2)
BUN/Creatinine Ratio: 18 (ref 12–28)
BUN: 15 mg/dL (ref 8–27)
CALCIUM: 9.1 mg/dL (ref 8.7–10.3)
CHLORIDE: 102 mmol/L (ref 96–106)
CO2: 28 mmol/L (ref 20–29)
Creatinine, Ser: 0.82 mg/dL (ref 0.57–1.00)
GFR calc non Af Amer: 76 mL/min/{1.73_m2} (ref >59)
GFR, EST AFRICAN AMERICAN: 87 mL/min/{1.73_m2} (ref >59)
GLUCOSE: 112 mg/dL — AB (ref 65–99)
Globulin, Total: 2.2 g/dL (ref 1.5–4.5)
Potassium: 3.3 mmol/L — ABNORMAL LOW (ref 3.5–5.2)
Sodium: 144 mmol/L (ref 134–144)
TOTAL PROTEIN: 6.4 g/dL (ref 6.0–8.5)

## 2016-10-04 LAB — CBC WITH DIFFERENTIAL/PLATELET
BASOS ABS: 0 10*3/uL (ref 0.0–0.2)
Basos: 0 %
EOS (ABSOLUTE): 0.1 10*3/uL (ref 0.0–0.4)
Eos: 1 %
Hematocrit: 37.6 % (ref 34.0–46.6)
Hemoglobin: 12.5 g/dL (ref 11.1–15.9)
IMMATURE GRANS (ABS): 0 10*3/uL (ref 0.0–0.1)
IMMATURE GRANULOCYTES: 0 %
LYMPHS: 24 %
Lymphocytes Absolute: 2.6 10*3/uL (ref 0.7–3.1)
MCH: 30.1 pg (ref 26.6–33.0)
MCHC: 33.2 g/dL (ref 31.5–35.7)
MCV: 91 fL (ref 79–97)
Monocytes Absolute: 0.4 10*3/uL (ref 0.1–0.9)
Monocytes: 4 %
NEUTROS PCT: 71 %
Neutrophils Absolute: 7.6 10*3/uL — ABNORMAL HIGH (ref 1.4–7.0)
PLATELETS: 259 10*3/uL (ref 150–379)
RBC: 4.15 x10E6/uL (ref 3.77–5.28)
RDW: 15.8 % — ABNORMAL HIGH (ref 12.3–15.4)
WBC: 10.7 10*3/uL (ref 3.4–10.8)

## 2016-10-04 LAB — HEMOGLOBIN A1C
Est. average glucose Bld gHb Est-mCnc: 140 mg/dL
Hgb A1c MFr Bld: 6.5 % — ABNORMAL HIGH (ref 4.8–5.6)

## 2016-10-04 MED ORDER — HYDROCODONE-ACETAMINOPHEN 5-325 MG PO TABS: 1.0000 | ORAL_TABLET | Freq: Four times a day (QID) | ORAL | 0 refills | Status: DC | PRN

## 2016-10-04 MED ORDER — LIDOCAINE 5 % EX PTCH: 3.0000 | MEDICATED_PATCH | CUTANEOUS | 3 refills | Status: DC

## 2016-10-04 MED ORDER — ZOSTER VAC RECOMB ADJUVANTED 50 MCG/0.5ML IM SUSR: 0.5000 mL | Freq: Once | INTRAMUSCULAR | 1 refills | Status: AC

## 2016-10-04 NOTE — Patient Instructions (Addendum)
We recommend that you schedule a mammogram for breast cancer screening. Typically, you do not need a referral to do this. Please contact a local imaging center to schedule your mammogram.  [facility] - [phone]  *ask for the Radiology Department [facility] - [phone] or [phone]  [facility] - [phone] [facility] - [phone] [facility] - [phone]  *ask for the Radiology Department [facility] - [phone]  *ask for the Radiology Department [facility] - [phone]  *ask for the Mammography Department [facility] - [phone]     IF you received an x-ray today, you will receive an invoice from [facility]. Please contact [facility] at [phone] with questions or concerns regarding your invoice.   IF you received labwork today, you will receive an invoice from [facility]. Please contact [facility] at [phone] with questions or concerns regarding your invoice.   Our billing staff will not be able to assist you with questions regarding bills from these companies.  You will be contacted with the lab results as soon as they are available. The fastest way to get your results is to activate your My Chart account. Instructions are located on the last page of this paperwork. If you have not heard from us regarding the results in 2 weeks, please contact this office.      

## 2016-10-04 NOTE — Progress Notes (Signed)
Subjective:    Patient ID: Maureen Hodge, female    DOB: [birth year], 66 y.o.   MRN: [id number]  10/04/2016  Right Flank Pain (2 month follow-up); Hypertension; and Diabetes   HPI This 66 y.o. female presents for evaluation of hypertension, DMII, R flank pain.  Management at last visit three months ago included: -new onset R flank pain; pain is similar to Zoster pain yet no evidence of rash; obtain LS spine films; treat supportively with rest, stretches, avoiding lifting greater than ten pounds.  Hydrocodone for pain. -new onset DMII mild due to weight gain and excessive prednisone in past two years.    I recommend weight loss, exercise, and low-carbohydrate low-sugar food choices. You should AVOID: regular sodas, sweetened tea, fruit juices.  You should LIMIT: breads, pastas, rice, potatoes, and desserts/sweets.  I would recommend limiting your total carbohydrate intake per meal to 45 grams; I would limit your total carbohydrate intake per snack to 30 grams.  I would also have a goal of 60 grams of protein intake per day; this would equal 10-15 grams of protein per meal and 5-10 grams of protein per snack. -persistent hypokalemia due to diuretic use; refill provided. -chronic excessive sweating with worsening with daily prednisone use.  Recommend weight loss as well. Recent TSH normal.   -PMR stable; weaning prednisone slowly per rheumatology. -s/p colonoscopy with polyps.  Decreased Prednisone to 10mg  daily for PMR.  Has decreased po intake since last visit.  Follow-up in November 2018.   Has gained so much weight with prolonged prednisone for past two years. L leg started hurting again one month ago.  L hips continue to hurt.  Not ready to see back specialist. R flank pain is gone.   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 Taking care of mother in law.  Excessive worry husband.    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 Face, arms, legs, face.  Not stressed out.  Has a lot going  on.   BP Readings from Last 3 Encounters:  10/04/16 130/82  07/28/16 128/82  07/12/16 (!) 126/42   Wt Readings from Last 3 Encounters:  10/04/16 197 lb (89.4 kg)  07/28/16 193 lb (87.5 kg)  07/12/16 189 lb (85.7 kg)   Immunization History  Administered Date(s) Administered  . Influenza,inj,Quad PF,6+ Mos 10/23/2012, 09/24/2013, 11/15/2014, 11/05/2015, 10/04/2016  . Tdap 10/05/2010    Review of Systems  Constitutional: Negative for chills, diaphoresis, fatigue and fever.  Eyes: Negative for visual disturbance.  Respiratory: Negative for cough and shortness of breath.   Cardiovascular: Negative for chest pain, palpitations and leg swelling.  Gastrointestinal: Negative for abdominal pain, constipation, diarrhea, nausea and vomiting.  Endocrine: Negative for cold intolerance, heat intolerance, polydipsia, polyphagia and polyuria.  Musculoskeletal: Positive for arthralgias and back pain. Negative for gait problem, joint swelling, myalgias, neck pain and neck stiffness.  Neurological: Negative for dizziness, tremors, seizures, syncope, facial asymmetry, speech difficulty, weakness, light-headedness, numbness and headaches.  Psychiatric/Behavioral: Negative for dysphoric mood. The patient is not nervous/anxious.     Past Medical History:  Diagnosis Date  . Allergy   . Arthritis   . Brain tumor ([place]) 05/2011   s/p resection  [facility].  [name] Cerebral aneurysm, nonruptured   . Diabetes mellitus    Pre-diabetes  . Diffuse cystic mastopathy   . Esophageal reflux   . Hypertension   . Hypopotassemia 06/01/2007  . Intervertebral disc disorder with myelopathy, unspecified region   . Lupus erythematosus   .  Migraines   . Other convulsions   . PLMD (periodic limb movement disorder)   . Presence of dental bridge    upper, permanent  . S/P bunionectomy 09/28/2013   [facility].  Right.  [name] Unspecified sleep apnea    CPAP titrated to 7cm   Past Surgical History:  Procedure Laterality  Date  . BRAIN AVM REPAIR    . BRAIN SURGERY  2013  . BREAST BIOPSY Bilateral    neg  . BREAST CYST ASPIRATION Bilateral   . BREAST SURGERY     x 5 due to fibrocystic disease  . COLONOSCOPY WITH PROPOFOL N/A 07/12/2016   Procedure: COLONOSCOPY WITH PROPOFOL;  Surgeon: Lucilla Lame, MD;  Location: [facility];  Service: Endoscopy;  Laterality: N/A;  sleep apnea  . DILATION AND CURETTAGE OF UTERUS     x 5  . FOOT SURGERY    . HEMORRHOID SURGERY    . HERNIA REPAIR    . lumbar surgery  2000  . POLYPECTOMY  07/12/2016   Procedure: POLYPECTOMY;  Surgeon: Lucilla Lame, MD;  Location: [facility];  Service: Endoscopy;;  . SPINE SURGERY    . TONSILLECTOMY AND ADENOIDECTOMY    . TOTAL ABDOMINAL HYSTERECTOMY  1979   postpartum hemorrhage; ovaries intact.   Allergies  Allergen Reactions  . Lisinopril Swelling    Face and lips    Social History   Social History  . Marital status: Married    Spouse name: N/A  . Number of children: 2  . Years of education: N/A   Occupational History  . Retired    Social History Main Topics  . Smoking status: Never Smoker  . Smokeless tobacco: Never Used  . Alcohol use No  . Drug use: No  . Sexual activity: Yes   Other Topics Concern  . Not on file   Social History Narrative   Marital status:  Married  X 46 years, happily , no abuse.       Children:  2 children; 4 grandchildren.      Lives: with husband.      Employment: disability.  Works part-time at [employer]; working PRN;.      Tobacco: never      Alcohol: wine once per month; 2-3 times per month      Drugs: none     Exercise: none     Caffeine Use: Some.    Seatbelt:  Always uses seat belts.       Smoke alarm in the home.   Exercise: Some   Family History  Problem Relation Age of Onset  . Heart disease Mother        CHF  . Heart failure Mother        CHF  . Hypertension Mother   . Stroke Mother   . Cancer Mother 66       unknown primary  . Hypertension  Sister   . Crohn's disease Sister   . Arthritis Sister        partial knee replacement.  . Diabetes Daughter   . Coronary artery disease Other        family history  . Cancer Other        family history  . Breast cancer Paternal Aunt        Objective:    BP 130/82   Pulse 73   Temp 98 F (36.7 C) (Oral)   Resp 16   Ht 5' 2.21" (1.58 m)   Wt 197  lb (89.4 kg)   SpO2 95%   BMI 35.79 kg/m  Physical Exam  Constitutional: She is oriented to person, place, and time. She appears well-developed and well-nourished. No distress.  HENT:  Head: Normocephalic and atraumatic.  Right Ear: External ear normal.  Left Ear: External ear normal.  Nose: Nose normal.  Mouth/Throat: Oropharynx is clear and moist.  Eyes: Pupils are equal, round, and reactive to light. Conjunctivae and EOM are normal.  Neck: Normal range of motion. Neck supple. Carotid bruit is not present. No thyromegaly present.  Cardiovascular: Normal rate, regular rhythm, normal heart sounds and intact distal pulses.  Exam reveals no gallop and no friction rub.   No murmur heard. Pulmonary/Chest: Effort normal and breath sounds normal. She has no wheezes. She has no rales.  Abdominal: Soft. Bowel sounds are normal. She exhibits no distension and no mass. There is no tenderness. There is no rebound and no guarding.  Musculoskeletal:       Left knee: Normal.       Left lower leg: She exhibits tenderness. She exhibits no bony tenderness, no swelling, no edema, no deformity and no laceration.       Legs: +TTP lateral superior aspect of LLE.  Lymphadenopathy:    She has no cervical adenopathy.  Neurological: She is alert and oriented to person, place, and time. No cranial nerve deficit.  Skin: Skin is warm and dry. No rash noted. She is not diaphoretic. No erythema. No pallor.  Psychiatric: She has a normal mood and affect. Her behavior is normal.    No results found. Depression screen [facility] 2/9 10/04/2016 07/28/2016 05/25/2016  02/10/2016 11/05/2015  Decreased Interest 0 0 0 0 0  Down, Depressed, Hopeless 0 0 0 0 0  PHQ - 2 Score 0 0 0 0 0   Fall Risk  10/04/2016 07/28/2016 05/25/2016 02/10/2016 11/05/2015  Falls in the past year? No No No No No        Assessment & Plan:   1. Type 2 diabetes mellitus without complication, without long-term current use of insulin ([place])   2. HYPERTENSION, BENIGN   3. Breast cancer screening   4. Need for prophylactic vaccination and inoculation against influenza   5. Discoid lupus   6. Spinal stenosis of lumbar region without neurogenic claudication   7. Polymyalgia rheumatica syndrome ([place])   8. Left leg pain   9. Class 2 drug-induced obesity with serious comorbidity and body mass index (BMI) of 35.0 to 35.9 in adult    -new onset DMII dietary controlled due to worsening obesity with prolonged prednisone use for past two years due to PMR.  Obtain labs.   I recommend weight loss, exercise, and low-carbohydrate low-sugar food choices. You should AVOID: regular sodas, sweetened tea, fruit juices.  You should LIMIT: breads, pastas, rice, potatoes, and desserts/sweets.  I would recommend limiting your total carbohydrate intake per meal to 45 grams; I would limit your total carbohydrate intake per snack to 30 grams.  I would also have a goal of 60 grams of protein intake per day; this would equal 10-15 grams of protein per meal and 5-10 grams of protein per snack. -blood pressure controlled. -weaning prednisone for PMR per rheumatology [facility] in [place].   -s/p recent follow-up with dermatology for discoid lupus which has acutely worsened. -R flank pain now resolved. -recurrent LLE pain; +TTP suggestive of shin splints versus muscular sprain yet also having L hip pain suggestive of LS radiculopathy; continue supportive treatment for now  per pt request. -recommend weight loss, exercise for 30-60 minutes five days per week; recommend 1200 kcal restriction per day with a minimum of 60 grams of  protein per day.   Orders Placed This Encounter  Procedures  . MM SCREENING BREAST TOMO BILATERAL    Standing Status:   Future    Standing Expiration Date:   12/05/2017    Order Specific Question:   Reason for Exam (SYMPTOM  OR DIAGNOSIS REQUIRED)    Answer:   annual screening    Order Specific Question:   Preferred imaging location?    Answer:   [facility]  . Flu Vaccine QUAD 36+ mos IM  . CBC with Differential/Platelet  . Comprehensive metabolic panel  . Hemoglobin A1c   Meds ordered this encounter  Medications  . Zoster Vac Recomb Adjuvanted [facility]) injection    Sig: Inject 0.5 mLs into the muscle once.    Dispense:  0.5 mL    Refill:  1  . HYDROcodone-acetaminophen (NORCO/VICODIN) 5-325 MG tablet    Sig: Take 1-2 tablets by mouth every 6 (six) hours as needed for moderate pain.    Dispense:  40 tablet    Refill:  0  . lidocaine (LIDODERM) 5 %    Sig: Place 3 patches onto the skin daily. Remove & Discard patch within 12 hours or as directed by MD    Dispense:  90 patch    Refill:  3    Return in about 4 months (around 02/04/2017) for complete physical examiniation.   Kristi Elayne Guerin, M.D. Primary Care at [facility] [address] [place], [place]  [address] [phone] phone [phone] fax

## 2016-10-04 NOTE — Telephone Encounter (Signed)
Please advise...Marland KitchenMarland Kitchenthe patient was seen by you today but this medication was last prescribe on 10/2015

## 2016-10-11 ENCOUNTER — Other Ambulatory Visit: Payer: Self-pay | Admitting: Family Medicine

## 2016-10-11 DIAGNOSIS — N6012 Diffuse cystic mastopathy of left breast: Secondary | ICD-10-CM | POA: Diagnosis not present

## 2016-10-11 DIAGNOSIS — N644 Mastodynia: Secondary | ICD-10-CM | POA: Diagnosis not present

## 2016-10-11 NOTE — Telephone Encounter (Signed)
PATIENT CALLED TO SAY SHE WILL TAKE HER LAST GABAPENTIN 800 MG THIS AFTERNOON SO SHE NEEDS IT REFILLED AS SOON AS POSSIBLE. SHE SAID SHE THOUGHT DR. Tamala Julian HAD ALREADY SENT HER IN TO HAVE REFILLS BUT THE PHARMACY TOLD HER SHE HAD NOT. BEST PHONE [phone] (CELL) PHARMACY CHOICE IS [facility] [place]. [place]

## 2016-10-12 ENCOUNTER — Other Ambulatory Visit: Payer: Self-pay | Admitting: Surgery

## 2016-10-12 DIAGNOSIS — N6012 Diffuse cystic mastopathy of left breast: Secondary | ICD-10-CM

## 2016-10-12 DIAGNOSIS — N644 Mastodynia: Secondary | ICD-10-CM

## 2016-10-14 ENCOUNTER — Other Ambulatory Visit: Payer: Self-pay | Admitting: Family Medicine

## 2016-10-14 DIAGNOSIS — I10 Essential (primary) hypertension: Secondary | ICD-10-CM

## 2016-10-19 ENCOUNTER — Ambulatory Visit
Admission: RE | Admit: 2016-10-19 | Discharge: 2016-10-19 | Disposition: A | Discharge status: Home / Self Care | Payer: 59 | Source: Ambulatory Visit | Attending: Surgery | Admitting: Surgery

## 2016-10-19 DIAGNOSIS — N644 Mastodynia: Secondary | ICD-10-CM

## 2016-10-19 DIAGNOSIS — N6012 Diffuse cystic mastopathy of left breast: Secondary | ICD-10-CM

## 2016-10-19 DIAGNOSIS — N6489 Other specified disorders of breast: Secondary | ICD-10-CM | POA: Diagnosis not present

## 2016-10-19 DIAGNOSIS — R922 Inconclusive mammogram: Secondary | ICD-10-CM | POA: Diagnosis not present

## 2016-10-22 ENCOUNTER — Telehealth: Payer: Self-pay | Admitting: Family Medicine

## 2016-10-22 NOTE — Telephone Encounter (Signed)
Pt is needing a refill on her blood pressure medication Atenolol   Best number [phone]

## 2016-10-23 ENCOUNTER — Other Ambulatory Visit: Payer: Self-pay

## 2016-10-23 ENCOUNTER — Telehealth: Payer: Self-pay | Admitting: Physician Assistant

## 2016-10-23 ENCOUNTER — Other Ambulatory Visit: Payer: Self-pay | Admitting: Family Medicine

## 2016-10-23 DIAGNOSIS — I10 Essential (primary) hypertension: Secondary | ICD-10-CM

## 2016-10-23 MED ORDER — TRIAMTERENE-HCTZ 37.5-25 MG PO TABS: 1.0000 | ORAL_TABLET | Freq: Every day | ORAL | 3 refills | Status: DC

## 2016-10-23 NOTE — Telephone Encounter (Signed)
Pt. Called to inform the practice that her bp medicine had not been filled, call was transferred to [facility]. I have called the pharmacy and verified that they do not have the script. Contacted [facility] [place] [facility] [place] to resend the script. I have verified the script is resent and have notified the pt.

## 2016-10-25 NOTE — Telephone Encounter (Signed)
Medication sent to pharmacy  

## 2016-11-15 ENCOUNTER — Other Ambulatory Visit: Payer: Self-pay | Admitting: Family Medicine

## 2016-11-17 ENCOUNTER — Other Ambulatory Visit: Payer: Self-pay | Admitting: Family Medicine

## 2016-11-24 ENCOUNTER — Other Ambulatory Visit: Payer: Self-pay | Admitting: Urgent Care

## 2016-11-30 DIAGNOSIS — Z79899 Other long term (current) drug therapy: Secondary | ICD-10-CM | POA: Diagnosis not present

## 2016-12-04 ENCOUNTER — Other Ambulatory Visit: Payer: Self-pay | Admitting: Family Medicine

## 2016-12-06 DIAGNOSIS — Z79899 Other long term (current) drug therapy: Secondary | ICD-10-CM | POA: Diagnosis not present

## 2016-12-06 DIAGNOSIS — M48062 Spinal stenosis, lumbar region with neurogenic claudication: Secondary | ICD-10-CM | POA: Diagnosis not present

## 2016-12-06 DIAGNOSIS — L932 Other local lupus erythematosus: Secondary | ICD-10-CM | POA: Diagnosis not present

## 2016-12-06 DIAGNOSIS — M25552 Pain in left hip: Secondary | ICD-10-CM | POA: Diagnosis not present

## 2016-12-06 DIAGNOSIS — M353 Polymyalgia rheumatica: Secondary | ICD-10-CM | POA: Diagnosis not present

## 2016-12-06 NOTE — Telephone Encounter (Signed)
Last ordered 11/17.  Was seen in office recently however this med was not reordered.

## 2016-12-16 ENCOUNTER — Ambulatory Visit
Admission: EM | Admit: 2016-12-16 | Discharge: 2016-12-16 | Disposition: A | Discharge status: Home / Self Care | Payer: 59 | Attending: Family Medicine | Admitting: Family Medicine

## 2016-12-16 ENCOUNTER — Other Ambulatory Visit: Payer: Self-pay

## 2016-12-16 DIAGNOSIS — R0789 Other chest pain: Secondary | ICD-10-CM | POA: Diagnosis not present

## 2016-12-16 DIAGNOSIS — M48061 Spinal stenosis, lumbar region without neurogenic claudication: Secondary | ICD-10-CM | POA: Diagnosis not present

## 2016-12-16 DIAGNOSIS — K219 Gastro-esophageal reflux disease without esophagitis: Secondary | ICD-10-CM | POA: Diagnosis not present

## 2016-12-16 DIAGNOSIS — E669 Obesity, unspecified: Secondary | ICD-10-CM | POA: Insufficient documentation

## 2016-12-16 DIAGNOSIS — E876 Hypokalemia: Secondary | ICD-10-CM | POA: Diagnosis not present

## 2016-12-16 DIAGNOSIS — G4733 Obstructive sleep apnea (adult) (pediatric): Secondary | ICD-10-CM | POA: Insufficient documentation

## 2016-12-16 DIAGNOSIS — Z9889 Other specified postprocedural states: Secondary | ICD-10-CM | POA: Insufficient documentation

## 2016-12-16 DIAGNOSIS — G43909 Migraine, unspecified, not intractable, without status migrainosus: Secondary | ICD-10-CM | POA: Diagnosis not present

## 2016-12-16 DIAGNOSIS — I10 Essential (primary) hypertension: Secondary | ICD-10-CM | POA: Diagnosis not present

## 2016-12-16 DIAGNOSIS — M353 Polymyalgia rheumatica: Secondary | ICD-10-CM | POA: Insufficient documentation

## 2016-12-16 DIAGNOSIS — R112 Nausea with vomiting, unspecified: Secondary | ICD-10-CM | POA: Insufficient documentation

## 2016-12-16 DIAGNOSIS — E119 Type 2 diabetes mellitus without complications: Secondary | ICD-10-CM | POA: Insufficient documentation

## 2016-12-16 DIAGNOSIS — M5116 Intervertebral disc disorders with radiculopathy, lumbar region: Secondary | ICD-10-CM | POA: Insufficient documentation

## 2016-12-16 DIAGNOSIS — L930 Discoid lupus erythematosus: Secondary | ICD-10-CM | POA: Diagnosis not present

## 2016-12-16 DIAGNOSIS — D649 Anemia, unspecified: Secondary | ICD-10-CM | POA: Diagnosis not present

## 2016-12-16 DIAGNOSIS — Z79899 Other long term (current) drug therapy: Secondary | ICD-10-CM | POA: Diagnosis not present

## 2016-12-16 DIAGNOSIS — Z90710 Acquired absence of both cervix and uterus: Secondary | ICD-10-CM | POA: Insufficient documentation

## 2016-12-16 DIAGNOSIS — R079 Chest pain, unspecified: Secondary | ICD-10-CM | POA: Diagnosis present

## 2016-12-16 LAB — CBC WITH DIFFERENTIAL/PLATELET
Basophils Absolute: 0.1 10*3/uL (ref 0–0.1)
Basophils Relative: 1 %
EOS ABS: 0.1 10*3/uL (ref 0–0.7)
EOS PCT: 1 %
HCT: 36.7 % (ref 35.0–47.0)
HEMOGLOBIN: 12.5 g/dL (ref 12.0–16.0)
LYMPHS ABS: 2.2 10*3/uL (ref 1.0–3.6)
LYMPHS PCT: 23 %
MCH: 31.4 pg (ref 26.0–34.0)
MCHC: 34 g/dL (ref 32.0–36.0)
MCV: 92.4 fL (ref 80.0–100.0)
MONOS PCT: 4 %
Monocytes Absolute: 0.4 10*3/uL (ref 0.2–0.9)
Neutro Abs: 7 10*3/uL — ABNORMAL HIGH (ref 1.4–6.5)
Neutrophils Relative %: 71 %
Platelets: 247 10*3/uL (ref 150–440)
RBC: 3.97 MIL/uL (ref 3.80–5.20)
RDW: 16 % — ABNORMAL HIGH (ref 11.5–14.5)
WBC: 9.8 10*3/uL (ref 3.6–11.0)

## 2016-12-16 LAB — COMPREHENSIVE METABOLIC PANEL
ALK PHOS: 48 U/L (ref 38–126)
ALT: 16 U/L (ref 14–54)
ANION GAP: 9 (ref 5–15)
AST: 30 U/L (ref 15–41)
Albumin: 3.6 g/dL (ref 3.5–5.0)
BUN: 18 mg/dL (ref 6–20)
CALCIUM: 8.5 mg/dL — AB (ref 8.9–10.3)
CO2: 28 mmol/L (ref 22–32)
CREATININE: 0.75 mg/dL (ref 0.44–1.00)
Chloride: 103 mmol/L (ref 101–111)
Glucose, Bld: 118 mg/dL — ABNORMAL HIGH (ref 65–99)
Potassium: 2.7 mmol/L — CL (ref 3.5–5.1)
Sodium: 140 mmol/L (ref 135–145)
TOTAL PROTEIN: 6.5 g/dL (ref 6.5–8.1)
Total Bilirubin: 1 mg/dL (ref 0.3–1.2)

## 2016-12-16 LAB — TROPONIN I

## 2016-12-16 LAB — LIPASE, BLOOD: LIPASE: 24 U/L (ref 11–51)

## 2016-12-16 MED ORDER — POTASSIUM CHLORIDE CRYS ER 20 MEQ PO TBCR
40.0000 meq | EXTENDED_RELEASE_TABLET | Freq: Once | ORAL | Status: AC
Start: 2016-12-16 — End: 2016-12-16
  Administered 2016-12-16: 40 meq via ORAL

## 2016-12-16 MED ORDER — ONDANSETRON 4 MG PO TBDP: 4.0000 mg | ORAL_TABLET | Freq: Three times a day (TID) | ORAL | 0 refills | Status: DC | PRN

## 2016-12-16 MED ORDER — POTASSIUM CHLORIDE CRYS ER 20 MEQ PO TBCR: 20.0000 meq | EXTENDED_RELEASE_TABLET | Freq: Two times a day (BID) | ORAL | 0 refills | Status: DC

## 2016-12-16 NOTE — ED Triage Notes (Signed)
Pt reports starting on Tuesday with central chest pain and then vomiting. Off and on since then. Pain in triage 2/10 and reports some SOB. Last vomited this a.m. At 0300. Hands tingling but improved some.

## 2016-12-16 NOTE — ED Provider Notes (Signed)
[facility]    CSN: [id number] Arrival date & time: 12/16/16  6812  History   Chief Complaint Chief Complaint  Patient presents with  . Chest Pain  . Emesis    HPI  66 year old female presents with nausea, vomiting, reports of chest pain, and abdominal pain.  Patient states that she began to not feel well on Tuesday.  She initially developed nausea and vomiting.  She had several episodes of emesis.  She reports associated chest discomfort (central). and epigastric pain.  Currently she has no chest pain.  She does report some shortness of breath.  She is currently experiencing some nausea but no vomiting since late last night.  Mild shortness of breath.  No known inciting factor.  No known exacerbating relieving factors.  Patient states that she feels improved currently.  Given her medical history and her symptoms, she thought it would be best that she be evaluated.  Patient has no other complaints or concerns at this time.  Past Medical History:  Diagnosis Date  . Allergy   . Arthritis   . Brain tumor ([place]) 05/2011   s/p resection  [facility].  [name] Cerebral aneurysm, nonruptured   . Diabetes mellitus    Pre-diabetes  . Diffuse cystic mastopathy   . Esophageal reflux   . Hypertension   . Hypopotassemia 06/01/2007  . Intervertebral disc disorder with myelopathy, unspecified region   . Lupus erythematosus   . Migraines   . Other convulsions   . PLMD (periodic limb movement disorder)   . Presence of dental bridge    upper, permanent  . S/P bunionectomy 09/28/2013   [facility].  Right.  [name] Unspecified sleep apnea    CPAP titrated to 7cm    Patient Active Problem List   Diagnosis Date Noted  . Polymyalgia rheumatica syndrome ([place]) 09/01/2016  . Benign neoplasm of ascending colon   . Benign neoplasm of cecum   . 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 OSA on CPAP 07/10/2014  . 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 Lumbar canal  stenosis 08/22/2012  . Anemia 01/24/2012  . Hypokalemia 10/25/2011  . GERD (gastroesophageal reflux disease) 10/25/2011  . Meningioma ([place]) 10/25/2011  . Benign neoplasm of cerebral meninges ([place]) 06/01/2011  . Adiposity 06/01/2011  . EDEMA 05/21/2009  . HYPERTENSION, BENIGN 03/20/2009  . SLEEP RELATED MOVEMENT DISORDER UNSPECIFIED 03/20/2009  . Benign cerebral hemangioma ([place]) 06/22/2001    Past Surgical History:  Procedure Laterality Date  . BRAIN AVM REPAIR    . BRAIN SURGERY  2013  . BREAST BIOPSY Bilateral    neg  . BREAST CYST ASPIRATION Bilateral   . BREAST SURGERY     x 5 due to fibrocystic disease  . COLONOSCOPY WITH PROPOFOL N/A 07/12/2016   Procedure: COLONOSCOPY WITH PROPOFOL;  Surgeon: Lucilla Lame, MD;  Location: [facility];  Service: Endoscopy;  Laterality: N/A;  sleep apnea  . DILATION AND CURETTAGE OF UTERUS     x 5  . FOOT SURGERY    . HEMORRHOID SURGERY    . HERNIA REPAIR    . lumbar surgery  2000  . POLYPECTOMY  07/12/2016   Procedure: POLYPECTOMY;  Surgeon: Lucilla Lame, MD;  Location: [facility];  Service: Endoscopy;;  . SPINE SURGERY    . TONSILLECTOMY AND ADENOIDECTOMY    . TOTAL ABDOMINAL HYSTERECTOMY  1979   postpartum hemorrhage; ovaries intact.    OB History    No data available  Home Medications    Prior to Admission medications   Medication Sig Start Date End Date Taking? Authorizing Provider  predniSONE (DELTASONE) 10 MG tablet Take 10 mg by mouth daily with breakfast.   Yes [provider]  albuterol (PROVENTIL HFA;VENTOLIN HFA) 108 (90 BASE) MCG/ACT inhaler Inhale 2 puffs into the lungs every 6 (six) hours as needed for wheezing or shortness of breath (cough, shortness of breath or wheezing.). 12/12/13   Wardell Honour, MD  atenolol (TENORMIN) 50 MG tablet Take 1 tablet (50 mg total) by mouth 2 (two) times daily. 10/09/15   Wardell Honour, MD  cetirizine (ZYRTEC) 10 MG tablet Take 1 tablet (10 mg total) by  mouth daily. 03/09/16   Wardell Honour, MD  Ciclopirox 1 % shampoo LATHER TO SCALP 1-2 TIMES A WEEK. ALLOW TO SIT 5 MINUTES BEFORE RINSING. 06/05/15   [provider]  clobetasol cream (TEMOVATE) 0.05 % APPLY A THIN LAYER TO THE AFFECTED AREAS TWICE DAILY AS NEEDED FOR A MAX OF 4 WEEKS PER FLARE 06/13/15   [provider]  furosemide (LASIX) 40 MG tablet Take 1 tablet (40 mg total) by mouth daily. 06/07/16   Minna Merritts, MD  gabapentin (NEURONTIN) 800 MG tablet TAKE 1 TABLET (800 MG TOTAL) BY MOUTH 4 (FOUR) TIMES DAILY. 10/11/16   Wardell Honour, MD  hydroxychloroquine (PLAQUENIL) 200 MG tablet Take 200 mg by mouth 2 (two) times daily.      [provider]  hydrOXYzine (ATARAX/VISTARIL) 25 MG tablet TAKE 0.5-1 TABLETS (12.5-25 MG TOTAL) BY MOUTH AT BEDTIME AS NEEDED (INSOMNIA). 11/24/16   Wardell Honour, MD  lidocaine (LIDODERM) 5 % Place 3 patches onto the skin daily. Remove & Discard patch within 12 hours or as directed by MD 10/04/16   Wardell Honour, MD  mometasone (ELOCON) 0.1 % ointment Apply topically Twice daily as needed. 11/09/11   [provider]  nitroGLYCERIN (NITROSTAT) 0.4 MG SL tablet Place 1 tablet (0.4 mg total) under the tongue every 5 (five) minutes as needed for chest pain. 01/23/13   Wardell Honour, MD  nortriptyline (PAMELOR) 25 MG capsule TAKE 1 CAPSULE (25 MG TOTAL) BY MOUTH 3 (THREE) TIMES DAILY. 12/07/16   Wardell Honour, MD  ondansetron (ZOFRAN-ODT) 4 MG disintegrating tablet Take 1 tablet (4 mg total) by mouth every 8 (eight) hours as needed for nausea or vomiting. 12/16/16   Thersa Salt G, DO  pantoprazole (PROTONIX) 40 MG tablet TAKE 1 TABLET (40 MG TOTAL) BY MOUTH DAILY. 11/17/16   Wardell Honour, MD  potassium chloride SA (KLOR-CON M20) 20 MEQ tablet Take 2 tablets (40 mEq total) by mouth daily. 07/28/16   Wardell Honour, MD  triamterene-hydrochlorothiazide (MAXZIDE-25) 37.5-25 MG tablet Take 1 tablet by mouth daily. 10/23/16   Wardell Honour, MD    Family History Family History  Problem Relation Age of Onset  . Heart disease Mother        CHF  . Heart failure Mother        CHF  . Hypertension Mother   . Stroke Mother   . Cancer Mother 35       unknown primary  . Hypertension Sister   . Crohn's disease Sister   . Arthritis Sister        partial knee replacement.  . Diabetes Daughter   . Coronary artery disease Other        family history  . Cancer Other  family history  . Breast cancer Paternal Aunt     Social History Social History   Tobacco Use  . Smoking status: Never Smoker  . Smokeless tobacco: Never Used  Substance Use Topics  . Alcohol use: No    Alcohol/week: 0.0 oz  . Drug use: No     Allergies   Lisinopril   Review of Systems Review of Systems  Constitutional: Positive for fatigue.  Respiratory: Positive for shortness of breath.   Cardiovascular: Positive for chest pain.       No chest pain currently  Gastrointestinal: Positive for abdominal pain, nausea and vomiting.   Physical Exam Triage Vital Signs ED Triage Vitals  Enc Vitals Group     BP 12/16/16 0930 132/84     Pulse Rate 12/16/16 0930 65     Resp 12/16/16 0930 20     Temp 12/16/16 0930 (!) 97.5 F (36.4 C)     Temp Source 12/16/16 0930 Oral     SpO2 12/16/16 0930 97 %     Weight 12/16/16 0930 197 lb (89.4 kg)     Height 12/16/16 0930 5\' 2"  (1.575 m)     Head Circumference --      Peak Flow --      Pain Score 12/16/16 0932 2     Pain Loc --      Pain Edu? --      Excl. in [place]? --    Updated Vital Signs BP 132/84 (BP Location: Left Arm)   Pulse 65   Temp (!) 97.5 F (36.4 C) (Oral)   Resp 20   Ht 5\' 2"  (1.575 m)   Wt 197 lb (89.4 kg)   SpO2 97%   BMI 36.03 kg/m    Physical Exam  Constitutional: She is oriented to person, place, and time. She appears well-developed. No distress.  HENT:  Head: Normocephalic and atraumatic.  Nose: Nose normal.  Eyes: Conjunctivae are normal.    Cardiovascular: Normal rate and regular rhythm.  No murmur heard. Pulmonary/Chest: Effort normal and breath sounds normal. She has no wheezes. She has no rales.  Abdominal: Soft.  Mild epigastric tenderness to palpation.  Nondistended.  No rebound or guarding.  Neurological: She is alert and oriented to person, place, and time.  Skin: Skin is warm. No rash noted.  Psychiatric: She has a normal mood and affect. Her behavior is normal.  Vitals reviewed.  UC Treatments / Results  Labs (all labs ordered are listed, but only abnormal results are displayed) Labs Reviewed  CBC WITH DIFFERENTIAL/PLATELET - Abnormal; Notable for the following components:      Result Value   RDW 16.0 (*)    Neutro Abs 7.0 (*)    All other components within normal limits  COMPREHENSIVE METABOLIC PANEL - Abnormal; Notable for the following components:   Potassium 2.7 (*)    Glucose, Bld 118 (*)    Calcium 8.5 (*)    All other components within normal limits  LIPASE, BLOOD  TROPONIN I    EKG Normal sinus rhythm at a rate of 61.  Normal intervals.  No ST or T wave changes.  Normal EKG.  Radiology No results found.  Procedures Procedures (including critical care time)  Medications Ordered in UC Medications  potassium chloride SA (K-DUR,KLOR-CON) CR tablet 40 mEq (not administered)     Initial Impression / Assessment and Plan / UC Course  I have reviewed the triage vital signs and the nursing notes.  Pertinent labs &  imaging results that were available during my care of the patient were reviewed by me and considered in my medical decision making (see chart for details).     66 year old female presents with several symptoms after initially developing nausea and vomiting.  She has no chest pain currently.  Troponin negative.  EKG without evidence of ischemia.  Laboratory studies revealed hypokalemia.  Potassium 2.7.  Patient given 40 mEq of potassium here.  Discharging home on oral potassium.   Recheck metabolic panel tomorrow.  Final Clinical Impressions(s) / UC Diagnoses   Final diagnoses:  Non-intractable vomiting with nausea, unspecified vomiting type  Atypical chest pain  Hypokalemia    ED Discharge Orders        Ordered    ondansetron (ZOFRAN-ODT) 4 MG disintegrating tablet  Every 8 hours PRN     12/16/16 1056     Controlled Substance Prescriptions [place] Controlled Substance Registry consulted? Not Applicable   Coral Spikes, DO 12/16/16 1103

## 2016-12-16 NOTE — Discharge Instructions (Signed)
Return tomorrow for repeat labs. If you worsen, go to the ED.  Take care  Dr. Lacinda Axon

## 2016-12-17 ENCOUNTER — Other Ambulatory Visit
Admission: RE | Admit: 2016-12-17 | Discharge: 2016-12-17 | Disposition: A | Discharge status: Home / Self Care | Payer: 59 | Source: Ambulatory Visit | Attending: Family Medicine | Admitting: Family Medicine

## 2016-12-17 DIAGNOSIS — E876 Hypokalemia: Secondary | ICD-10-CM | POA: Insufficient documentation

## 2016-12-17 LAB — BASIC METABOLIC PANEL
ANION GAP: 10 (ref 5–15)
BUN: 15 mg/dL (ref 6–20)
CO2: 25 mmol/L (ref 22–32)
Calcium: 8.9 mg/dL (ref 8.9–10.3)
Chloride: 105 mmol/L (ref 101–111)
Creatinine, Ser: 0.69 mg/dL (ref 0.44–1.00)
GLUCOSE: 118 mg/dL — AB (ref 65–99)
POTASSIUM: 3.8 mmol/L (ref 3.5–5.1)
SODIUM: 140 mmol/L (ref 135–145)

## 2017-01-06 ENCOUNTER — Other Ambulatory Visit: Payer: Self-pay | Admitting: Family Medicine

## 2017-01-17 DIAGNOSIS — L930 Discoid lupus erythematosus: Secondary | ICD-10-CM | POA: Diagnosis not present

## 2017-01-17 DIAGNOSIS — M353 Polymyalgia rheumatica: Secondary | ICD-10-CM | POA: Diagnosis not present

## 2017-01-17 DIAGNOSIS — M7551 Bursitis of right shoulder: Secondary | ICD-10-CM | POA: Diagnosis not present

## 2017-01-17 DIAGNOSIS — Z79899 Other long term (current) drug therapy: Secondary | ICD-10-CM | POA: Diagnosis not present

## 2017-02-01 DIAGNOSIS — Z79899 Other long term (current) drug therapy: Secondary | ICD-10-CM | POA: Diagnosis not present

## 2017-02-01 DIAGNOSIS — M3210 Systemic lupus erythematosus, organ or system involvement unspecified: Secondary | ICD-10-CM | POA: Diagnosis not present

## 2017-02-01 DIAGNOSIS — H40033 Anatomical narrow angle, bilateral: Secondary | ICD-10-CM | POA: Diagnosis not present

## 2017-02-01 DIAGNOSIS — H52223 Regular astigmatism, bilateral: Secondary | ICD-10-CM | POA: Diagnosis not present

## 2017-02-01 DIAGNOSIS — H3589 Other specified retinal disorders: Secondary | ICD-10-CM | POA: Diagnosis not present

## 2017-02-01 DIAGNOSIS — H25813 Combined forms of age-related cataract, bilateral: Secondary | ICD-10-CM | POA: Diagnosis not present

## 2017-02-01 DIAGNOSIS — H5203 Hypermetropia, bilateral: Secondary | ICD-10-CM | POA: Diagnosis not present

## 2017-02-01 DIAGNOSIS — H524 Presbyopia: Secondary | ICD-10-CM | POA: Diagnosis not present

## 2017-02-08 NOTE — Progress Notes (Signed)
Subjective:    Patient ID: Maureen Hodge, female    DOB: 1950/10/06, 67 y.o.   MRN: [id number]  02/09/2017  Annual Exam    HPI This 67 y.o. female presents for Complete Physical Examination.  Last physical:  02-10-2016 Pap smear:  hysterectomy Mammogram:  10/19/2016 Colonoscopy:  07/12/2016 Bone density:  none Eye exam:  07/26/2016; last week field tests for Plaquenil.every 3 months. Dental exam: every six months.   Visual Acuity Screening   Right eye Left eye Both eyes  Without correction:     With correction: 20/25 20/20 20/20     BP Readings from Last 3 Encounters:  02/09/17 122/82  12/16/16 132/84  10/04/16 130/82   Wt Readings from Last 3 Encounters:  02/09/17 192 lb (87.1 kg)  12/16/16 197 lb (89.4 kg)  10/04/16 197 lb (89.4 kg)   Immunization History  Administered Date(s) Administered  . Influenza,inj,Quad PF,6+ Mos 10/23/2012, 09/24/2013, 11/15/2014, 11/05/2015, 10/04/2016  . Tdap 10/05/2010   Health Maintenance  Topic Date Due  . PAP SMEAR  01/24/2015  . OPHTHALMOLOGY EXAM  07/26/2017  . HEMOGLOBIN A1C  08/09/2017  . FOOT EXAM  10/04/2017  . URINE MICROALBUMIN  02/09/2018  . MAMMOGRAM  10/20/2018  . TETANUS/TDAP  10/04/2020  . COLONOSCOPY  07/12/2021  . INFLUENZA VACCINE  Completed  . Hepatitis C Screening  Completed  . HIV Screening  Completed    Review of Systems  Constitutional: Negative for activity change, appetite change, chills, diaphoresis, fatigue, fever and unexpected weight change.  HENT: Negative for congestion, dental problem, drooling, ear discharge, ear pain, facial swelling, hearing loss, mouth sores, nosebleeds, postnasal drip, rhinorrhea, sinus pressure, sneezing, sore throat, tinnitus, trouble swallowing and voice change.   Eyes: Negative for photophobia, pain, discharge, redness, itching and visual disturbance.  Respiratory: Negative for apnea, cough, choking, chest tightness, shortness of breath, wheezing and stridor.     Cardiovascular: Negative for chest pain, palpitations and leg swelling.  Gastrointestinal: Negative for abdominal distention, abdominal pain, anal bleeding, blood in stool, constipation, diarrhea, nausea, rectal pain and vomiting.  Endocrine: Negative for cold intolerance, heat intolerance, polydipsia, polyphagia and polyuria.  Genitourinary: Negative for decreased urine volume, difficulty urinating, dyspareunia, dysuria, enuresis, flank pain, frequency, genital sores, hematuria, menstrual problem, pelvic pain, urgency, vaginal bleeding, vaginal discharge and vaginal pain.       Nocturia x 0.  Urinary leakage none.  Musculoskeletal: Positive for back pain and myalgias. Negative for arthralgias, gait problem, joint swelling, neck pain and neck stiffness.  Skin: Positive for rash. Negative for color change, pallor and wound.  Allergic/Immunologic: Negative for environmental allergies, food allergies and immunocompromised state.  Neurological: Negative for dizziness, tremors, seizures, syncope, facial asymmetry, speech difficulty, weakness, light-headedness, numbness and headaches.  Hematological: Negative for adenopathy. Does not bruise/bleed easily.  Psychiatric/Behavioral: Negative for agitation, behavioral problems, confusion, decreased concentration, dysphoric mood, hallucinations, self-injury, sleep disturbance and suicidal ideas. The patient is not nervous/anxious and is not hyperactive.        Bedtime 1130; wakes up 830.    Past Medical History:  Diagnosis Date  . Allergy   . Arthritis   . Brain tumor ([place]) 05/2011   s/p resection  [facility].  [name] Cerebral aneurysm, nonruptured   . Diabetes mellitus    Pre-diabetes  . Diffuse cystic mastopathy   . Esophageal reflux   . Hypertension   . Hypopotassemia 06/01/2007  . Intervertebral disc disorder with myelopathy, unspecified region   . Lupus erythematosus   . Migraines   .  Other convulsions   . PLMD (periodic limb movement  disorder)   . Presence of dental bridge    upper, permanent  . S/P bunionectomy 09/28/2013   [facility].  Right.  [name] Unspecified sleep apnea    CPAP titrated to 7cm   Past Surgical History:  Procedure Laterality Date  . BRAIN AVM REPAIR    . BRAIN SURGERY  2013  . BREAST BIOPSY Bilateral    neg  . BREAST CYST ASPIRATION Bilateral   . BREAST SURGERY     x 5 due to fibrocystic disease  . COLONOSCOPY WITH PROPOFOL N/A 07/12/2016   Procedure: COLONOSCOPY WITH PROPOFOL;  Surgeon: Lucilla Lame, MD;  Location: [facility];  Service: Endoscopy;  Laterality: N/A;  sleep apnea  . DILATION AND CURETTAGE OF UTERUS     x 5  . FOOT SURGERY    . HEMORRHOID SURGERY    . HERNIA REPAIR    . lumbar surgery  2000  . POLYPECTOMY  07/12/2016   Procedure: POLYPECTOMY;  Surgeon: Lucilla Lame, MD;  Location: [facility];  Service: Endoscopy;;  . SPINE SURGERY    . TONSILLECTOMY AND ADENOIDECTOMY    . TOTAL ABDOMINAL HYSTERECTOMY  1979   postpartum hemorrhage; ovaries intact.   Allergies  Allergen Reactions  . Lisinopril Swelling    Face and lips   Current Outpatient Medications on File Prior to Visit  Medication Sig Dispense Refill  . albuterol (PROVENTIL HFA;VENTOLIN HFA) 108 (90 BASE) MCG/ACT inhaler Inhale 2 puffs into the lungs every 6 (six) hours as needed for wheezing or shortness of breath (cough, shortness of breath or wheezing.). 1 Inhaler 0  . cetirizine (ZYRTEC) 10 MG tablet Take 1 tablet (10 mg total) by mouth daily. 90 tablet 3  . Ciclopirox 1 % shampoo LATHER TO SCALP 1-2 TIMES A WEEK. ALLOW TO SIT 5 MINUTES BEFORE RINSING.  1  . clobetasol cream (TEMOVATE) 0.05 % APPLY A THIN LAYER TO THE AFFECTED AREAS TWICE DAILY AS NEEDED FOR A MAX OF 4 WEEKS PER FLARE  1  . hydroxychloroquine (PLAQUENIL) 200 MG tablet Take 200 mg by mouth 2 (two) times daily.      . methotrexate 2.5 MG tablet Take by mouth once a week.    . mometasone (ELOCON) 0.1 % ointment Apply topically Twice daily as  needed.    . nitroGLYCERIN (NITROSTAT) 0.4 MG SL tablet Place 1 tablet (0.4 mg total) under the tongue every 5 (five) minutes as needed for chest pain. 30 tablet 0  . potassium chloride SA (K-DUR,KLOR-CON) 20 MEQ tablet Take 1 tablet (20 mEq total) by mouth 2 (two) times daily. 4 tablet 0  . predniSONE (DELTASONE) 10 MG tablet Take 10 mg by mouth daily with breakfast.     No current facility-administered medications on file prior to visit.    Social History   Socioeconomic History  . Marital status: Married    Spouse name: Not on file  . Number of children: 2  . Years of education: Not on file  . Highest education level: Not on file  Social Needs  . Financial resource strain: Not on file  . Food insecurity - worry: Not on file  . Food insecurity - inability: Not on file  . Transportation needs - medical: Not on file  . Transportation needs - non-medical: Not on file  Occupational History  . Occupation: Retired  Tobacco Use  . Smoking status: Never Smoker  . Smokeless tobacco: Never Used  Substance and  Sexual Activity  . Alcohol use: No    Alcohol/week: 0.0 oz  . Drug use: No  . Sexual activity: Yes    Birth control/protection: Post-menopausal, Surgical  Other Topics Concern  . Not on file  Social History Narrative   Marital status:  Married  X 47 years, happily , no abuse.       Children:  2 children; 4 grandchildren.      Lives: with husband.      Employment: disability.  Works part-time at [employer]; working PRN.      Tobacco: never      Alcohol: wine once per month; 2-3 times per month      Drugs: none     Exercise: none     Caffeine Use: Some.    Seatbelt:  Always uses seat belts.       Smoke alarm in the home.   Exercise: Some   Family History  Problem Relation Age of Onset  . Heart disease Mother        CHF  . Heart failure Mother        CHF  . Hypertension Mother   . Stroke Mother   . Cancer Mother 39       unknown primary  . Hypertension Sister     . Crohn's disease Sister   . Arthritis Sister        partial knee replacement.  . Diabetes Daughter   . Coronary artery disease Other        family history  . Cancer Other        family history  . Breast cancer Paternal Aunt        Objective:    BP 122/82   Pulse 73   Temp 98 F (36.7 C) (Oral)   Resp 16   Ht 5' 1.42" (1.56 m)   Wt 192 lb (87.1 kg)   SpO2 98%   BMI 35.79 kg/m  Physical Exam  Constitutional: She is oriented to person, place, and time. She appears well-developed and well-nourished. No distress.  HENT:  Head: Normocephalic and atraumatic.  Right Ear: Hearing, tympanic membrane, external ear and ear canal normal.  Left Ear: Hearing, tympanic membrane, external ear and ear canal normal.  Nose: Nose normal.  Mouth/Throat: Oropharynx is clear and moist.  Eyes: Conjunctivae and EOM are normal. Pupils are equal, round, and reactive to light.  Neck: Normal range of motion and full passive range of motion without pain. Neck supple. No JVD present. Carotid bruit is not present. No thyromegaly present.  Cardiovascular: Normal rate, regular rhythm, normal heart sounds and intact distal pulses. Exam reveals no gallop and no friction rub.  No murmur heard. Pulmonary/Chest: Effort normal and breath sounds normal. No respiratory distress. She has no wheezes. She has no rales. Right breast exhibits no inverted nipple, no mass, no nipple discharge, no skin change and no tenderness. Left breast exhibits no inverted nipple, no mass, no nipple discharge, no skin change and no tenderness. Breasts are symmetrical.  Abdominal: Soft. Bowel sounds are normal. She exhibits no distension and no mass. There is no tenderness. There is no rebound and no guarding.  Musculoskeletal:       Right shoulder: Normal.       Left shoulder: Normal.       Cervical back: Normal.  Lymphadenopathy:    She has no cervical adenopathy.  Neurological: She is alert and oriented to person, place, and time.  She has normal reflexes. No  cranial nerve deficit. She exhibits normal muscle tone. Coordination normal.  Skin: Skin is warm and dry. No rash noted. She is not diaphoretic. No erythema. No pallor.  Psychiatric: She has a normal mood and affect. Her behavior is normal. Judgment and thought content normal.  Nursing note and vitals reviewed.  No results found. Depression screen [facility] 2/9 02/09/2017 10/04/2016 07/28/2016 05/25/2016 02/10/2016  Decreased Interest 0 0 0 0 0  Down, Depressed, Hopeless 0 0 0 0 0  PHQ - 2 Score 0 0 0 0 0   Fall Risk  02/09/2017 10/04/2016 07/28/2016 05/25/2016 02/10/2016  Falls in the past year? No No No No No        Assessment & Plan:   1. Routine physical examination   2. HYPERTENSION, BENIGN   3. Benign cerebral hemangioma ([place])   4. OSA on CPAP   5. Gastroesophageal reflux disease without esophagitis   6. Screening, lipid   7. Type 2 diabetes mellitus without complication, without long-term current use of insulin ([place])   8. Essential hypertension   9. Polymyalgia rheumatica syndrome (HCC) Chronic  10. 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 -obtain age appropriate screening labs and labs for chronic disease management. -recommend weight loss, exercise for 30-60 minutes five days per week; recommend 1200 kcal restriction per day with a minimum of 60 grams of protein per day.   Orders Placed This Encounter  Procedures  . CBC with Differential/Platelet  . Comprehensive metabolic panel    Order Specific Question:   Has the patient fasted?    Answer:   No  . Hemoglobin A1c  . Lipid panel    Order Specific Question:   Has the patient fasted?    Answer:   No  . TSH  . Microalbumin, urine  . POCT urinalysis dipstick   Meds ordered this encounter  Medications  . atenolol (TENORMIN) 50 MG tablet    Sig: Take 1  tablet (50 mg total) by mouth 2 (two) times daily.    Dispense:  180 tablet    Refill:  3  . furosemide (LASIX) 40 MG tablet    Sig: Take 1 tablet (40 mg total) by mouth daily.    Dispense:  90 tablet    Refill:  1  . gabapentin (NEURONTIN) 800 MG tablet    Sig: TAKE 1 TABLET (800 MG TOTAL) BY MOUTH 4 (FOUR) TIMES DAILY.    Dispense:  360 tablet    Refill:  3  . hydrOXYzine (ATARAX/VISTARIL) 25 MG tablet    Sig: Take 0.5-1 tablets (12.5-25 mg total) by mouth at bedtime as needed (insomnia).    Dispense:  30 tablet    Refill:  2  . lidocaine (LIDODERM) 5 %    Sig: Place 3 patches onto the skin daily. Remove & Discard patch within 12 hours or as directed by MD    Dispense:  90 patch    Refill:  1  . nortriptyline (PAMELOR) 25 MG capsule    Sig: Take 1 capsule (25 mg total) by mouth 3 (three) times daily.    Dispense:  270 capsule    Refill:  3  . pantoprazole (PROTONIX) 40 MG tablet    Sig: Take 1 tablet (40 mg total) by mouth daily.    Dispense:  90 tablet    Refill:  3  . triamterene-hydrochlorothiazide (MAXZIDE-25)  37.5-25 MG tablet    Sig: Take 1 tablet by mouth daily.    Dispense:  90 tablet    Refill:  3  . traMADol (ULTRAM) 50 MG tablet    Sig: Take 1 tablet (50 mg total) by mouth every 12 (twelve) hours as needed.    Dispense:  60 tablet    Refill:  0    Return in about 3 months (around 05/09/2017) for follow-up chronic medical conditions.   Chamaine Stankus Elayne Guerin, M.D. [facility] [address] [place], [place]  [address] [phone] phone [phone] fax

## 2017-02-09 ENCOUNTER — Ambulatory Visit (INDEPENDENT_AMBULATORY_CARE_PROVIDER_SITE_OTHER): Payer: 59 | Admitting: Family Medicine

## 2017-02-09 ENCOUNTER — Encounter: Payer: Self-pay | Admitting: Family Medicine

## 2017-02-09 ENCOUNTER — Other Ambulatory Visit: Payer: Self-pay

## 2017-02-09 VITALS — BP 122/82 | HR 73 | Temp 98.0°F | Resp 16 | Ht 61.42 in | Wt 192.0 lb

## 2017-02-09 DIAGNOSIS — M48061 Spinal stenosis, lumbar region without neurogenic claudication: Secondary | ICD-10-CM

## 2017-02-09 DIAGNOSIS — E119 Type 2 diabetes mellitus without complications: Secondary | ICD-10-CM

## 2017-02-09 DIAGNOSIS — L930 Discoid lupus erythematosus: Secondary | ICD-10-CM

## 2017-02-09 DIAGNOSIS — D1802 Hemangioma of intracranial structures: Secondary | ICD-10-CM | POA: Diagnosis not present

## 2017-02-09 DIAGNOSIS — K219 Gastro-esophageal reflux disease without esophagitis: Secondary | ICD-10-CM

## 2017-02-09 DIAGNOSIS — I10 Essential (primary) hypertension: Secondary | ICD-10-CM

## 2017-02-09 DIAGNOSIS — Z9989 Dependence on other enabling machines and devices: Secondary | ICD-10-CM

## 2017-02-09 DIAGNOSIS — Z13220 Encounter for screening for lipoid disorders: Secondary | ICD-10-CM

## 2017-02-09 DIAGNOSIS — G4733 Obstructive sleep apnea (adult) (pediatric): Secondary | ICD-10-CM

## 2017-02-09 DIAGNOSIS — Z0000 Encounter for general adult medical examination without abnormal findings: Secondary | ICD-10-CM

## 2017-02-09 DIAGNOSIS — M353 Polymyalgia rheumatica: Secondary | ICD-10-CM

## 2017-02-09 LAB — POCT URINALYSIS DIP (MANUAL ENTRY)
BILIRUBIN UA: NEGATIVE
BILIRUBIN UA: NEGATIVE mg/dL
Blood, UA: NEGATIVE
GLUCOSE UA: NEGATIVE mg/dL
LEUKOCYTES UA: NEGATIVE
NITRITE UA: NEGATIVE
Spec Grav, UA: >=1.03 — AB (ref 1.010–1.025)
Urobilinogen, UA: 0.2 E.U./dL
pH, UA: 7 (ref 5.0–8.0)

## 2017-02-09 MED ORDER — TRIAMTERENE-HCTZ 37.5-25 MG PO TABS: 1.0000 | ORAL_TABLET | Freq: Every day | ORAL | 3 refills | Status: AC

## 2017-02-09 MED ORDER — NORTRIPTYLINE HCL 25 MG PO CAPS: 25.0000 mg | ORAL_CAPSULE | Freq: Three times a day (TID) | ORAL | 3 refills | Status: AC

## 2017-02-09 MED ORDER — FUROSEMIDE 40 MG PO TABS: 40.0000 mg | ORAL_TABLET | Freq: Every day | ORAL | 1 refills | Status: AC

## 2017-02-09 MED ORDER — PANTOPRAZOLE SODIUM 40 MG PO TBEC: 40.0000 mg | DELAYED_RELEASE_TABLET | Freq: Every day | ORAL | 3 refills | Status: AC

## 2017-02-09 MED ORDER — ATENOLOL 50 MG PO TABS: 50.0000 mg | ORAL_TABLET | Freq: Two times a day (BID) | ORAL | 3 refills | Status: AC

## 2017-02-09 MED ORDER — TRAMADOL HCL 50 MG PO TABS: 50.0000 mg | ORAL_TABLET | Freq: Two times a day (BID) | ORAL | 0 refills | Status: DC | PRN

## 2017-02-09 MED ORDER — HYDROXYZINE HCL 25 MG PO TABS: 12.5000 mg | ORAL_TABLET | Freq: Every evening | ORAL | 2 refills | Status: AC | PRN

## 2017-02-09 MED ORDER — GABAPENTIN 800 MG PO TABS: ORAL_TABLET | ORAL | 3 refills | Status: AC

## 2017-02-09 MED ORDER — LIDOCAINE 5 % EX PTCH: 3.0000 | MEDICATED_PATCH | CUTANEOUS | 1 refills | Status: DC

## 2017-02-09 NOTE — Patient Instructions (Addendum)
IF you received an x-ray today, you will receive an invoice from [facility]. Please contact [facility] at [phone] with questions or concerns regarding your invoice.   IF you received labwork today, you will receive an invoice from [facility]. Please contact [facility] at [phone] with questions or concerns regarding your invoice.   Our billing staff will not be able to assist you with questions regarding bills from these companies.  You will be contacted with the lab results as soon as they are available. The fastest way to get your results is to activate your My Chart account. Instructions are located on the last page of this paperwork. If you have not heard from [place] regarding the results in 2 weeks, please contact this office.      Preventive Care 40-64 Years, Female Preventive care refers to lifestyle choices and visits with your health care provider that can promote health and wellness. What does preventive care include?  A yearly physical exam. This is also called an annual well check.  Dental exams once or twice a year.  Routine eye exams. Ask your health care provider how often you should have your eyes checked.  Personal lifestyle choices, including: ? Daily care of your teeth and gums. ? Regular physical activity. ? Eating a healthy diet. ? Avoiding tobacco and drug use. ? Limiting alcohol use. ? Practicing safe sex. ? Taking low-dose aspirin daily starting at age 34. ? Taking vitamin and mineral supplements as recommended by your health care provider. What happens during an annual well check? The services and screenings done by your health care provider during your annual well check will depend on your age, overall health, lifestyle risk factors, and family history of disease. Counseling Your health care provider may ask you questions about your:  Alcohol use.  Tobacco use.  Drug use.  Emotional well-being.  Home and relationship  well-being.  Sexual activity.  Eating habits.  Work and work Statistician.  Method of birth control.  Menstrual cycle.  Pregnancy history.  Screening You may have the following tests or measurements:  Height, weight, and BMI.  Blood pressure.  Lipid and cholesterol levels. These may be checked every 5 years, or more frequently if you are over 65 years old.  Skin check.  Lung cancer screening. You may have this screening every year starting at age 102 if you have a 30-pack-year history of smoking and currently smoke or have quit within the past 15 years.  Fecal occult blood test (FOBT) of the stool. You may have this test every year starting at age 33.  Flexible sigmoidoscopy or colonoscopy. You may have a sigmoidoscopy every 5 years or a colonoscopy every 10 years starting at age 23.  Hepatitis C blood test.  Hepatitis B blood test.  Sexually transmitted disease (STD) testing.  Diabetes screening. This is done by checking your blood sugar (glucose) after you have not eaten for a while (fasting). You may have this done every 1-3 years.  Mammogram. This may be done every 1-2 years. Talk to your health care provider about when you should start having regular mammograms. This may depend on whether you have a family history of breast cancer.  BRCA-related cancer screening. This may be done if you have a family history of breast, ovarian, tubal, or peritoneal cancers.  Pelvic exam and Pap test. This may be done every 3 years starting at age 3. Starting at age 55, this may be done every 5 years if  you have a Pap test in combination with an HPV test.  Bone density scan. This is done to screen for osteoporosis. You may have this scan if you are at high risk for osteoporosis.  Discuss your test results, treatment options, and if necessary, the need for more tests with your health care provider. Vaccines Your health care provider may recommend certain vaccines, such  as:  Influenza vaccine. This is recommended every year.  Tetanus, diphtheria, and acellular pertussis (Tdap, Td) vaccine. You may need a Td booster every 10 years.  Varicella vaccine. You may need this if you have not been vaccinated.  Zoster vaccine. You may need this after age 77.  Measles, mumps, and rubella (MMR) vaccine. You may need at least one dose of MMR if you were born in 1957 or later. You may also need a second dose.  Pneumococcal 13-valent conjugate (PCV13) vaccine. You may need this if you have certain conditions and were not previously vaccinated.  Pneumococcal polysaccharide (PPSV23) vaccine. You may need one or two doses if you smoke cigarettes or if you have certain conditions.  Meningococcal vaccine. You may need this if you have certain conditions.  Hepatitis A vaccine. You may need this if you have certain conditions or if you travel or work in places where you may be exposed to hepatitis A.  Hepatitis B vaccine. You may need this if you have certain conditions or if you travel or work in places where you may be exposed to hepatitis B.  Haemophilus influenzae type b (Hib) vaccine. You may need this if you have certain conditions.  Talk to your health care provider about which screenings and vaccines you need and how often you need them. This information is not intended to replace advice given to you by your health care provider. Make sure you discuss any questions you have with your health care provider. Document Released: 01/17/2015 Document Revised: 09/10/2015 Document Reviewed: 10/22/2014 Elsevier Interactive Patient Education  [employer].

## 2017-02-10 LAB — CBC WITH DIFFERENTIAL/PLATELET
BASOS ABS: 0 10*3/uL (ref 0.0–0.2)
Basos: 0 %
EOS (ABSOLUTE): 0.1 10*3/uL (ref 0.0–0.4)
EOS: 1 %
HEMATOCRIT: 39.2 % (ref 34.0–46.6)
HEMOGLOBIN: 12.9 g/dL (ref 11.1–15.9)
IMMATURE GRANULOCYTES: 0 %
Immature Grans (Abs): 0 10*3/uL (ref 0.0–0.1)
LYMPHS ABS: 4.2 10*3/uL — AB (ref 0.7–3.1)
LYMPHS: 41 %
MCH: 30.9 pg (ref 26.6–33.0)
MCHC: 32.9 g/dL (ref 31.5–35.7)
MCV: 94 fL (ref 79–97)
MONOCYTES: 5 %
Monocytes Absolute: 0.5 10*3/uL (ref 0.1–0.9)
NEUTROS PCT: 53 %
Neutrophils Absolute: 5.4 10*3/uL (ref 1.4–7.0)
Platelets: 236 10*3/uL (ref 150–379)
RBC: 4.18 x10E6/uL (ref 3.77–5.28)
RDW: 15.7 % — ABNORMAL HIGH (ref 12.3–15.4)
WBC: 10.1 10*3/uL (ref 3.4–10.8)

## 2017-02-10 LAB — COMPREHENSIVE METABOLIC PANEL
ALBUMIN: 3.9 g/dL (ref 3.6–4.8)
ALK PHOS: 69 IU/L (ref 39–117)
ALT: 14 IU/L (ref 0–32)
AST: 18 IU/L (ref 0–40)
Albumin/Globulin Ratio: 1.6 (ref 1.2–2.2)
BUN / CREAT RATIO: 23 (ref 12–28)
BUN: 18 mg/dL (ref 8–27)
Bilirubin Total: 0.4 mg/dL (ref 0.0–1.2)
CALCIUM: 9.4 mg/dL (ref 8.7–10.3)
CO2: 24 mmol/L (ref 20–29)
CREATININE: 0.79 mg/dL (ref 0.57–1.00)
Chloride: 103 mmol/L (ref 96–106)
GFR calc Af Amer: 91 mL/min/{1.73_m2} (ref >59)
GFR, EST NON AFRICAN AMERICAN: 79 mL/min/{1.73_m2} (ref >59)
GLOBULIN, TOTAL: 2.5 g/dL (ref 1.5–4.5)
GLUCOSE: 97 mg/dL (ref 65–99)
Potassium: 3.3 mmol/L — ABNORMAL LOW (ref 3.5–5.2)
Sodium: 144 mmol/L (ref 134–144)
Total Protein: 6.4 g/dL (ref 6.0–8.5)

## 2017-02-10 LAB — HEMOGLOBIN A1C
ESTIMATED AVERAGE GLUCOSE: 131 mg/dL
HEMOGLOBIN A1C: 6.2 % — AB (ref 4.8–5.6)

## 2017-02-10 LAB — LIPID PANEL
Chol/HDL Ratio: 2.6 ratio (ref 0.0–4.4)
Cholesterol, Total: 184 mg/dL (ref 100–199)
HDL: 72 mg/dL (ref >39)
LDL Calculated: 88 mg/dL (ref 0–99)
TRIGLYCERIDES: 120 mg/dL (ref 0–149)
VLDL CHOLESTEROL CAL: 24 mg/dL (ref 5–40)

## 2017-02-10 LAB — MICROALBUMIN, URINE: Microalbumin, Urine: 12.6 ug/mL

## 2017-02-10 LAB — TSH: TSH: 2.88 u[IU]/mL (ref 0.450–4.500)

## 2017-03-09 DIAGNOSIS — L810 Postinflammatory hyperpigmentation: Secondary | ICD-10-CM | POA: Diagnosis not present

## 2017-03-09 DIAGNOSIS — L932 Other local lupus erythematosus: Secondary | ICD-10-CM | POA: Diagnosis not present

## 2017-03-12 ENCOUNTER — Other Ambulatory Visit: Payer: Self-pay | Admitting: Family Medicine

## 2017-03-13 ENCOUNTER — Other Ambulatory Visit: Payer: Self-pay | Admitting: Family Medicine

## 2017-03-14 NOTE — Telephone Encounter (Signed)
LOV 02/09/17 with Dr. Tamala Julian / Refill request for Tramadol /

## 2017-03-15 NOTE — Telephone Encounter (Signed)
Tramadol refill approved

## 2017-03-15 NOTE — Telephone Encounter (Signed)
Pt called to f/u on RX for Tramadol. Pt is out of medication. Please advise.  [id number] - [place], [place] - [address] [phone] (Phone) [phone] (Fax)

## 2017-04-13 ENCOUNTER — Telehealth: Payer: Self-pay | Admitting: Family Medicine

## 2017-04-13 ENCOUNTER — Encounter: Payer: Self-pay | Admitting: Physician Assistant

## 2017-04-13 MED ORDER — TRAMADOL HCL 50 MG PO TABS: 50.0000 mg | ORAL_TABLET | Freq: Two times a day (BID) | ORAL | 0 refills | Status: DC | PRN

## 2017-04-13 NOTE — Telephone Encounter (Signed)
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

## 2017-04-27 ENCOUNTER — Encounter: Payer: Self-pay | Admitting: Family Medicine

## 2017-04-27 ENCOUNTER — Other Ambulatory Visit: Payer: Self-pay

## 2017-04-27 ENCOUNTER — Ambulatory Visit (INDEPENDENT_AMBULATORY_CARE_PROVIDER_SITE_OTHER): Payer: 59 | Admitting: Family Medicine

## 2017-04-27 VITALS — BP 120/82 | HR 75 | Temp 97.6°F | Resp 16 | Ht 61.22 in | Wt 193.0 lb

## 2017-04-27 DIAGNOSIS — M353 Polymyalgia rheumatica: Secondary | ICD-10-CM

## 2017-04-27 DIAGNOSIS — M48061 Spinal stenosis, lumbar region without neurogenic claudication: Secondary | ICD-10-CM | POA: Diagnosis not present

## 2017-04-27 DIAGNOSIS — R350 Frequency of micturition: Secondary | ICD-10-CM

## 2017-04-27 DIAGNOSIS — M25552 Pain in left hip: Secondary | ICD-10-CM

## 2017-04-27 NOTE — Progress Notes (Signed)
Subjective:    Patient ID: Maureen Hodge, female    DOB: 07/26/1950, 67 y.o.   MRN: [id number]  04/27/2017  Chronic Conditions    HPI This 67 y.o. female presents for evaluation of L hip pain; onset three months ago; no injury; known DDD lumbar spine; this pain is mostly in the L hip; +radiation into L leg.  No n/t/w.  No saddle paresthesias; no b/b dysfunction.  No pain with laying on hip at nighttime; some limping with prolonged ambulation.   Taking Tramadol 1 every six hours. Maintained on prednisone for PMR.    Immunization History  Administered Date(s) Administered  . Influenza,inj,Quad PF,6+ Mos 10/23/2012, 09/24/2013, 11/15/2014, 11/05/2015, 10/04/2016  . Tdap 10/05/2010    Review of Systems  Constitutional: Negative for chills, diaphoresis, fatigue and fever.  HENT: Negative for ear pain, postnasal drip, rhinorrhea, sinus pressure, sore throat and trouble swallowing.   Respiratory: Negative for cough and shortness of breath.   Cardiovascular: Negative for chest pain, palpitations and leg swelling.  Gastrointestinal: Negative for abdominal pain, constipation, diarrhea, nausea and vomiting.  Genitourinary: Negative for decreased urine volume and difficulty urinating.  Musculoskeletal: Positive for arthralgias, back pain, gait problem and myalgias.  Neurological: Negative for weakness and numbness.    Past Medical History:  Diagnosis Date  . Allergy   . Arthritis   . Brain tumor ([place]) 05/2011   s/p resection  [facility].  [name] Cerebral aneurysm, nonruptured   . Diabetes mellitus    Pre-diabetes  . Diffuse cystic mastopathy   . Esophageal reflux   . Hypertension   . Hypopotassemia 06/01/2007  . Intervertebral disc disorder with myelopathy, unspecified region   . Lupus erythematosus   . Migraines   . Other convulsions   . PLMD (periodic limb movement disorder)   . Presence of dental bridge    upper, permanent  . S/P bunionectomy 09/28/2013   [facility].   Right.  [name] Unspecified sleep apnea    CPAP titrated to 7cm   Past Surgical History:  Procedure Laterality Date  . BRAIN AVM REPAIR    . BRAIN SURGERY  2013  . BREAST BIOPSY Bilateral    neg  . BREAST CYST ASPIRATION Bilateral   . BREAST SURGERY     x 5 due to fibrocystic disease  . COLONOSCOPY WITH PROPOFOL N/A 07/12/2016   Procedure: COLONOSCOPY WITH PROPOFOL;  Surgeon: Lucilla Lame, MD;  Location: [facility];  Service: Endoscopy;  Laterality: N/A;  sleep apnea  . DILATION AND CURETTAGE OF UTERUS     x 5  . FOOT SURGERY    . HEMORRHOID SURGERY    . HERNIA REPAIR    . lumbar surgery  2000  . POLYPECTOMY  07/12/2016   Procedure: POLYPECTOMY;  Surgeon: Lucilla Lame, MD;  Location: [facility];  Service: Endoscopy;;  . SPINE SURGERY    . TONSILLECTOMY AND ADENOIDECTOMY    . TOTAL ABDOMINAL HYSTERECTOMY  1979   postpartum hemorrhage; ovaries intact.   Allergies  Allergen Reactions  . Lisinopril Swelling    Face and lips   Current Outpatient Medications on File Prior to Visit  Medication Sig Dispense Refill  . atenolol (TENORMIN) 50 MG tablet Take 1 tablet (50 mg total) by mouth 2 (two) times daily. 180 tablet 3  . cetirizine (ZYRTEC) 10 MG tablet TAKE 1 TABLET (10 MG TOTAL) BY MOUTH DAILY. 90 tablet 3  . Ciclopirox 1 % shampoo LATHER TO SCALP 1-2 TIMES A WEEK. ALLOW TO  SIT 5 MINUTES BEFORE RINSING.  1  . clobetasol cream (TEMOVATE) 0.05 % APPLY A THIN LAYER TO THE AFFECTED AREAS TWICE DAILY AS NEEDED FOR A MAX OF 4 WEEKS PER FLARE  1  . furosemide (LASIX) 40 MG tablet Take 1 tablet (40 mg total) by mouth daily. 90 tablet 1  . gabapentin (NEURONTIN) 800 MG tablet TAKE 1 TABLET (800 MG TOTAL) BY MOUTH 4 (FOUR) TIMES DAILY. 360 tablet 3  . hydroxychloroquine (PLAQUENIL) 200 MG tablet Take 200 mg by mouth 2 (two) times daily.      . hydrOXYzine (ATARAX/VISTARIL) 25 MG tablet Take 0.5-1 tablets (12.5-25 mg total) by mouth at bedtime as needed (insomnia). 30 tablet 2  .  lidocaine (LIDODERM) 5 % Place 3 patches onto the skin daily. Remove & Discard patch within 12 hours or as directed by MD 90 patch 1  . methotrexate 2.5 MG tablet Take by mouth once a week.    . mometasone (ELOCON) 0.1 % ointment Apply topically Twice daily as needed.    . nitroGLYCERIN (NITROSTAT) 0.4 MG SL tablet Place 1 tablet (0.4 mg total) under the tongue every 5 (five) minutes as needed for chest pain. 30 tablet 0  . nortriptyline (PAMELOR) 25 MG capsule Take 1 capsule (25 mg total) by mouth 3 (three) times daily. 270 capsule 3  . pantoprazole (PROTONIX) 40 MG tablet Take 1 tablet (40 mg total) by mouth daily. 90 tablet 3  . triamterene-hydrochlorothiazide (MAXZIDE-25) 37.5-25 MG tablet Take 1 tablet by mouth daily. 90 tablet 3   No current facility-administered medications on file prior to visit.    Social History   Socioeconomic History  . Marital status: Married    Spouse name: Not on file  . Number of children: 2  . Years of education: Not on file  . Highest education level: Not on file  Occupational History  . Occupation: Retired  Scientific laboratory technician  . Financial resource strain: Not on file  . Food insecurity:    Worry: Not on file    Inability: Not on file  . Transportation needs:    Medical: Not on file    Non-medical: Not on file  Tobacco Use  . Smoking status: Never Smoker  . Smokeless tobacco: Never Used  Substance and Sexual Activity  . Alcohol use: No    Alcohol/week: 0.0 oz  . Drug use: No  . Sexual activity: Yes    Birth control/protection: Post-menopausal, Surgical  Lifestyle  . Physical activity:    Days per week: Not on file    Minutes per session: Not on file  . Stress: Not on file  Relationships  . Social connections:    Talks on phone: Not on file    Gets together: Not on file    Attends religious service: Not on file    Active member of club or organization: Not on file    Attends meetings of clubs or organizations: Not on file    Relationship  status: Not on file  . Intimate partner violence:    Fear of current or ex partner: Not on file    Emotionally abused: Not on file    Physically abused: Not on file    Forced sexual activity: Not on file  Other Topics Concern  . Not on file  Social History Narrative   Marital status:  Married  X 47 years, happily , no abuse.       Children:  2 children; 4 grandchildren.  Lives: with husband.      Employment: disability.  Works part-time at [employer]; working PRN.      Tobacco: never      Alcohol: wine once per month; 2-3 times per month      Drugs: none     Exercise: none     Caffeine Use: Some.    Seatbelt:  Always uses seat belts.       Smoke alarm in the home.   Exercise: Some   Family History  Problem Relation Age of Onset  . Heart disease Mother        CHF  . Heart failure Mother        CHF  . Hypertension Mother   . Stroke Mother   . Cancer Mother 62       unknown primary  . Hypertension Sister   . Crohn's disease Sister   . Arthritis Sister        partial knee replacement.  . Diabetes Daughter   . Coronary artery disease Other        family history  . Cancer Other        family history  . Breast cancer Paternal Aunt        Objective:    BP 120/82   Pulse 75   Temp 97.6 F (36.4 C) (Oral)   Resp 16   Ht 5' 1.22" (1.555 m)   Wt 193 lb (87.5 kg)   SpO2 98%   BMI 36.21 kg/m  Physical Exam  Constitutional: She is oriented to person, place, and time. She appears well-developed and well-nourished. No distress.  HENT:  Head: Normocephalic and atraumatic.  Eyes: Pupils are equal, round, and reactive to light. Conjunctivae are normal.  Neck: Normal range of motion. Neck supple.  Cardiovascular: Normal rate, regular rhythm and normal heart sounds. Exam reveals no gallop and no friction rub.  No murmur heard. Pulmonary/Chest: Effort normal and breath sounds normal. She has no wheezes. She has no rales.  Musculoskeletal:       Left hip: She exhibits  decreased range of motion. She exhibits normal strength, no tenderness, no bony tenderness, no swelling, no crepitus, no deformity and no laceration.       Lumbar back: She exhibits decreased range of motion, tenderness, pain and spasm. She exhibits no bony tenderness, no swelling, no edema, no deformity, no laceration and normal pulse.  Neurological: She is alert and oriented to person, place, and time.  Skin: She is not diaphoretic.  Psychiatric: She has a normal mood and affect. Her behavior is normal.  Nursing note and vitals reviewed.  No results found.         Assessment & Plan:   1. Left hip pain   2. Urinary frequency   3. Spinal stenosis of lumbar region without neurogenic claudication   4. Polymyalgia rheumatica syndrome ([place])     New onset LEFT hip pain; yet history of DDD lumbar spine; s/p recent imaging of LEFT hip and lumbar spine by rheumatology; thus, refer to orthopedics for further evaluation; avoid NSAIDs due to Prednisone therapy for PMR.  Continue Tramadol for pain.  Home exercise program recommended.   Urinary frequency: New onset; obtain urine cx.    Orders Placed This Encounter  Procedures  . Urine Culture  . Urine Culture  . Urinalysis, dipstick only  . Urine Microscopic  . Urine Microscopic  . Ambulatory referral to Orthopedic Surgery    Referral Priority:   Routine  Referral Type:   Surgical    Referral Reason:   Specialty Services Required    Requested Specialty:   Orthopedic Surgery    Number of Visits Requested:   1   No orders of the defined types were placed in this encounter.   Return if symptoms worsen or fail to improve.   Matraca Hunkins Elayne Guerin, M.D. Primary Care at [facility] previously [facility] [address] [place], [place]  [address] [phone] phone [phone] fax

## 2017-04-27 NOTE — Patient Instructions (Signed)
     IF you received an x-ray today, you will receive an invoice from [facility]. Please contact [facility] at [phone] with questions or concerns regarding your invoice.   IF you received labwork today, you will receive an invoice from [facility]. Please contact [facility] at [phone] with questions or concerns regarding your invoice.   Our billing staff will not be able to assist you with questions regarding bills from these companies.  You will be contacted with the lab results as soon as they are available. The fastest way to get your results is to activate your My Chart account. Instructions are located on the last page of this paperwork. If you have not heard from us regarding the results in 2 weeks, please contact this office.     

## 2017-04-28 LAB — URINALYSIS, MICROSCOPIC ONLY
CASTS: NONE SEEN /LPF
WBC, UA: NONE SEEN /hpf (ref 0–5)

## 2017-04-28 LAB — URINALYSIS, DIPSTICK ONLY
Bilirubin, UA: NEGATIVE
Glucose, UA: NEGATIVE
KETONES UA: NEGATIVE
LEUKOCYTES UA: NEGATIVE
NITRITE UA: NEGATIVE
Protein, UA: NEGATIVE
RBC UA: NEGATIVE
SPEC GRAV UA: 1.016 (ref 1.005–1.030)
Urobilinogen, Ur: 0.2 mg/dL (ref 0.2–1.0)
pH, UA: 7.5 (ref 5.0–7.5)

## 2017-04-28 LAB — URINE CULTURE

## 2017-05-05 ENCOUNTER — Other Ambulatory Visit: Payer: Self-pay | Admitting: Family Medicine

## 2017-05-05 DIAGNOSIS — M25552 Pain in left hip: Secondary | ICD-10-CM

## 2017-05-05 NOTE — Telephone Encounter (Signed)
Copied from [place] [id number]. Topic: Quick Communication - Rx Refill/Question >> May 05, 2017  9:20 AM Robina Ade, Helene Kelp D wrote: Medication: traMADol (ULTRAM) 50 MG tablet Has the patient contacted their pharmacy? Yes (Agent: If no, request that the patient contact the pharmacy for the refill.) Preferred Pharmacy (with phone number or street name): [id number] - [place], [place] [address] Agent: Please be advised that RX refills may take up to 3 business days. We ask that you follow-up with your pharmacy.

## 2017-05-05 NOTE — Telephone Encounter (Signed)
LOV 02/09/17 Dr. Tamala Julian Last refill 04/13/17  # 60  0 refills

## 2017-05-06 ENCOUNTER — Other Ambulatory Visit: Payer: Self-pay | Admitting: Family Medicine

## 2017-05-06 NOTE — Telephone Encounter (Signed)
Refill request Tramadol  LOV 04/27/2017  PHARMACY ON FILE

## 2017-05-13 MED ORDER — TRAMADOL HCL 50 MG PO TABS: 50.0000 mg | ORAL_TABLET | Freq: Two times a day (BID) | ORAL | 0 refills | Status: DC | PRN

## 2017-05-18 ENCOUNTER — Ambulatory Visit: Payer: 59 | Admitting: Family Medicine

## 2017-05-23 ENCOUNTER — Other Ambulatory Visit: Payer: Self-pay | Admitting: Sports Medicine

## 2017-05-23 DIAGNOSIS — M48062 Spinal stenosis, lumbar region with neurogenic claudication: Secondary | ICD-10-CM

## 2017-05-23 DIAGNOSIS — M25552 Pain in left hip: Secondary | ICD-10-CM

## 2017-05-23 DIAGNOSIS — M4726 Other spondylosis with radiculopathy, lumbar region: Secondary | ICD-10-CM

## 2017-05-25 ENCOUNTER — Ambulatory Visit: Payer: 59 | Admitting: Family Medicine

## 2017-05-31 ENCOUNTER — Encounter: Payer: Self-pay | Admitting: Family Medicine

## 2017-06-07 ENCOUNTER — Other Ambulatory Visit: Payer: Self-pay | Admitting: Family Medicine

## 2017-06-07 DIAGNOSIS — M25552 Pain in left hip: Secondary | ICD-10-CM

## 2017-06-08 NOTE — Telephone Encounter (Signed)
Rx refill request Ultram 50 mg  LOV 04-27-2017 Dr Tamala Julian  Last filled 05-13-2017  60 tabs 1 tab q 12 hr prn  Dr Tamala Julian  Pharmacy on file

## 2017-06-08 NOTE — Telephone Encounter (Signed)
Pt says that she lost the rx and never got it filled

## 2017-06-09 ENCOUNTER — Ambulatory Visit
Admission: RE | Admit: 2017-06-09 | Discharge: 2017-06-09 | Disposition: A | Discharge status: Home / Self Care | Payer: 59 | Source: Ambulatory Visit | Attending: Sports Medicine | Admitting: Sports Medicine

## 2017-06-09 DIAGNOSIS — M5116 Intervertebral disc disorders with radiculopathy, lumbar region: Secondary | ICD-10-CM | POA: Diagnosis not present

## 2017-06-09 DIAGNOSIS — M8938 Hypertrophy of bone, other site: Secondary | ICD-10-CM | POA: Insufficient documentation

## 2017-06-09 DIAGNOSIS — M5416 Radiculopathy, lumbar region: Secondary | ICD-10-CM | POA: Diagnosis present

## 2017-06-09 DIAGNOSIS — G8929 Other chronic pain: Secondary | ICD-10-CM | POA: Diagnosis present

## 2017-06-09 DIAGNOSIS — M25552 Pain in left hip: Secondary | ICD-10-CM | POA: Diagnosis present

## 2017-06-09 DIAGNOSIS — M4726 Other spondylosis with radiculopathy, lumbar region: Secondary | ICD-10-CM | POA: Insufficient documentation

## 2017-06-09 DIAGNOSIS — M4807 Spinal stenosis, lumbosacral region: Secondary | ICD-10-CM | POA: Insufficient documentation

## 2017-06-09 DIAGNOSIS — M48062 Spinal stenosis, lumbar region with neurogenic claudication: Secondary | ICD-10-CM

## 2017-06-10 NOTE — Telephone Encounter (Signed)
This was not originally routed

## 2017-06-15 ENCOUNTER — Other Ambulatory Visit: Payer: Self-pay

## 2017-06-15 ENCOUNTER — Encounter: Payer: Self-pay | Admitting: Family Medicine

## 2017-06-15 ENCOUNTER — Ambulatory Visit (INDEPENDENT_AMBULATORY_CARE_PROVIDER_SITE_OTHER): Payer: 59 | Admitting: Family Medicine

## 2017-06-15 VITALS — BP 108/72 | HR 70 | Temp 98.6°F | Resp 16 | Ht 61.0 in | Wt 193.0 lb

## 2017-06-15 DIAGNOSIS — M5136 Other intervertebral disc degeneration, lumbar region: Secondary | ICD-10-CM

## 2017-06-15 DIAGNOSIS — E876 Hypokalemia: Secondary | ICD-10-CM

## 2017-06-15 DIAGNOSIS — K219 Gastro-esophageal reflux disease without esophagitis: Secondary | ICD-10-CM

## 2017-06-15 DIAGNOSIS — G4733 Obstructive sleep apnea (adult) (pediatric): Secondary | ICD-10-CM | POA: Diagnosis not present

## 2017-06-15 DIAGNOSIS — M5137 Other intervertebral disc degeneration, lumbosacral region: Secondary | ICD-10-CM

## 2017-06-15 DIAGNOSIS — Z9989 Dependence on other enabling machines and devices: Secondary | ICD-10-CM

## 2017-06-15 DIAGNOSIS — I10 Essential (primary) hypertension: Secondary | ICD-10-CM | POA: Diagnosis not present

## 2017-06-15 LAB — POCT URINALYSIS DIP (MANUAL ENTRY)
BILIRUBIN UA: NEGATIVE
BILIRUBIN UA: NEGATIVE mg/dL
Blood, UA: NEGATIVE
GLUCOSE UA: NEGATIVE mg/dL
Nitrite, UA: NEGATIVE
Protein Ur, POC: NEGATIVE mg/dL
SPEC GRAV UA: 1.02 (ref 1.010–1.025)
Urobilinogen, UA: 0.2 E.U./dL
pH, UA: 6.5 (ref 5.0–8.0)

## 2017-06-15 LAB — COMPREHENSIVE METABOLIC PANEL
ALBUMIN: 3.9 g/dL (ref 3.6–4.8)
ALK PHOS: 89 IU/L (ref 39–117)
ALT: 14 IU/L (ref 0–32)
AST: 24 IU/L (ref 0–40)
Albumin/Globulin Ratio: 1.9 (ref 1.2–2.2)
BILIRUBIN TOTAL: 0.3 mg/dL (ref 0.0–1.2)
BUN / CREAT RATIO: 10 — AB (ref 12–28)
BUN: 9 mg/dL (ref 8–27)
CHLORIDE: 100 mmol/L (ref 96–106)
CO2: 25 mmol/L (ref 20–29)
CREATININE: 0.87 mg/dL (ref 0.57–1.00)
Calcium: 9.3 mg/dL (ref 8.7–10.3)
GFR calc Af Amer: 81 mL/min/{1.73_m2} (ref >59)
GFR calc non Af Amer: 71 mL/min/{1.73_m2} (ref >59)
GLUCOSE: 100 mg/dL — AB (ref 65–99)
Globulin, Total: 2.1 g/dL (ref 1.5–4.5)
Potassium: 3.3 mmol/L — ABNORMAL LOW (ref 3.5–5.2)
Sodium: 144 mmol/L (ref 134–144)
Total Protein: 6 g/dL (ref 6.0–8.5)

## 2017-06-15 LAB — CBC WITH DIFFERENTIAL/PLATELET
BASOS ABS: 0 10*3/uL (ref 0.0–0.2)
Basos: 0 %
EOS (ABSOLUTE): 0.1 10*3/uL (ref 0.0–0.4)
Eos: 1 %
Hematocrit: 36.4 % (ref 34.0–46.6)
Hemoglobin: 12.3 g/dL (ref 11.1–15.9)
Immature Grans (Abs): 0 10*3/uL (ref 0.0–0.1)
Immature Granulocytes: 0 %
LYMPHS ABS: 2.4 10*3/uL (ref 0.7–3.1)
Lymphs: 25 %
MCH: 30.8 pg (ref 26.6–33.0)
MCHC: 33.8 g/dL (ref 31.5–35.7)
MCV: 91 fL (ref 79–97)
MONOS ABS: 0.4 10*3/uL (ref 0.1–0.9)
Monocytes: 4 %
Neutrophils Absolute: 6.7 10*3/uL (ref 1.4–7.0)
Neutrophils: 70 %
PLATELETS: 268 10*3/uL (ref 150–450)
RBC: 3.99 x10E6/uL (ref 3.77–5.28)
RDW: 14.7 % (ref 12.3–15.4)
WBC: 9.6 10*3/uL (ref 3.4–10.8)

## 2017-06-15 MED ORDER — TRAMADOL HCL 50 MG PO TABS: 50.0000 mg | ORAL_TABLET | Freq: Four times a day (QID) | ORAL | 2 refills | Status: AC | PRN

## 2017-06-15 NOTE — Patient Instructions (Addendum)
  Call in August at [facility] in [place] for a September appointment. [phone].      IF you received an x-ray today, you will receive an invoice from [facility]. Please contact [facility] at [phone] with questions or concerns regarding your invoice.   IF you received labwork today, you will receive an invoice from [facility]. Please contact [facility] at [phone] with questions or concerns regarding your invoice.   Our billing staff will not be able to assist you with questions regarding bills from these companies.  You will be contacted with the lab results as soon as they are available. The fastest way to get your results is to activate your My Chart account. Instructions are located on the last page of this paperwork. If you have not heard from [place] regarding the results in 2 weeks, please contact this office.

## 2017-06-15 NOTE — Progress Notes (Signed)
Subjective:    Patient ID: Maureen Hodge, female    DOB: 07/10/1950, 67 y.o.   MRN: [id number]  06/15/2017  Chronic Conditions (3 month follow-up)    HPI This 67 y.o. female presents for six week follow-up evaluation of hypertension, glucose intolerance, OSA, GERD.  S/P MRI lumbar spine; degenerative disc disease; scheduled for EMG on 06/22/17.  Will not prescribe medication. Eating less.  All lower back with radiculopathy.   S/p ortho consultation. S/p rheumatology consultation. Referral to pain management.  Wants to avoid surgery.  Upcoming appointment with pain management is pending.   BP Readings from Last 3 Encounters:  06/15/17 108/72  04/27/17 120/82  02/09/17 122/82   Wt Readings from Last 3 Encounters:  06/15/17 193 lb (87.5 kg)  04/27/17 193 lb (87.5 kg)  02/09/17 192 lb (87.1 kg)   Immunization History  Administered Date(s) Administered  . Influenza,inj,Quad PF,6+ Mos 10/23/2012, 09/24/2013, 11/15/2014, 11/05/2015, 10/04/2016  . Tdap 10/05/2010    Review of Systems  Constitutional: Negative for activity change, appetite change, chills, diaphoresis, fatigue, fever and unexpected weight change.  HENT: Negative for congestion, dental problem, drooling, ear discharge, ear pain, facial swelling, hearing loss, mouth sores, nosebleeds, postnasal drip, rhinorrhea, sinus pressure, sneezing, sore throat, tinnitus, trouble swallowing and voice change.   Eyes: Negative for photophobia, pain, discharge, redness, itching and visual disturbance.  Respiratory: Negative for apnea, cough, choking, chest tightness, shortness of breath, wheezing and stridor.   Cardiovascular: Negative for chest pain, palpitations and leg swelling.  Gastrointestinal: Negative for abdominal distention, abdominal pain, anal bleeding, blood in stool, constipation, diarrhea, nausea, rectal pain and vomiting.  Endocrine: Negative for cold intolerance, heat intolerance, polydipsia, polyphagia and  polyuria.  Genitourinary: Negative for decreased urine volume, difficulty urinating, dyspareunia, dysuria, enuresis, flank pain, frequency, genital sores, hematuria, menstrual problem, pelvic pain, urgency, vaginal bleeding, vaginal discharge and vaginal pain.  Musculoskeletal: Positive for back pain. Negative for arthralgias, gait problem, joint swelling, myalgias, neck pain and neck stiffness.  Skin: Negative for color change, pallor, rash and wound.  Allergic/Immunologic: Negative for environmental allergies, food allergies and immunocompromised state.  Neurological: Negative for dizziness, tremors, seizures, syncope, facial asymmetry, speech difficulty, weakness, light-headedness, numbness and headaches.  Hematological: Negative for adenopathy. Does not bruise/bleed easily.  Psychiatric/Behavioral: Negative for agitation, behavioral problems, confusion, decreased concentration, dysphoric mood, hallucinations, self-injury, sleep disturbance and suicidal ideas. The patient is not nervous/anxious and is not hyperactive.     Past Medical History:  Diagnosis Date  . Allergy   . Arthritis   . Brain tumor ([place]) 05/2011   s/p resection  [facility].  [name] Cerebral aneurysm, nonruptured   . Diabetes mellitus    Pre-diabetes  . Diffuse cystic mastopathy   . Esophageal reflux   . Hypertension   . Hypopotassemia 06/01/2007  . Intervertebral disc disorder with myelopathy, unspecified region   . Lupus erythematosus   . Migraines   . Other convulsions   . PLMD (periodic limb movement disorder)   . Presence of dental bridge    upper, permanent  . S/P bunionectomy 09/28/2013   [facility].  Right.  [name] Unspecified sleep apnea    CPAP titrated to 7cm   Past Surgical History:  Procedure Laterality Date  . BRAIN AVM REPAIR    . BRAIN SURGERY  2013  . BREAST BIOPSY Bilateral    neg  . BREAST CYST ASPIRATION Bilateral   . BREAST SURGERY     x 5 due to fibrocystic disease  .  COLONOSCOPY WITH  PROPOFOL N/A 07/12/2016   Procedure: COLONOSCOPY WITH PROPOFOL;  Surgeon: Lucilla Lame, MD;  Location: [facility];  Service: Endoscopy;  Laterality: N/A;  sleep apnea  . DILATION AND CURETTAGE OF UTERUS     x 5  . FOOT SURGERY    . HEMORRHOID SURGERY    . HERNIA REPAIR    . lumbar surgery  2000  . POLYPECTOMY  07/12/2016   Procedure: POLYPECTOMY;  Surgeon: Lucilla Lame, MD;  Location: [facility];  Service: Endoscopy;;  . SPINE SURGERY    . TONSILLECTOMY AND ADENOIDECTOMY    . TOTAL ABDOMINAL HYSTERECTOMY  1979   postpartum hemorrhage; ovaries intact.   Allergies  Allergen Reactions  . Lisinopril Swelling    Face and lips   Current Outpatient Medications on File Prior to Visit  Medication Sig Dispense Refill  . atenolol (TENORMIN) 50 MG tablet Take 1 tablet (50 mg total) by mouth 2 (two) times daily. 180 tablet 3  . cetirizine (ZYRTEC) 10 MG tablet TAKE 1 TABLET (10 MG TOTAL) BY MOUTH DAILY. 90 tablet 3  . Ciclopirox 1 % shampoo LATHER TO SCALP 1-2 TIMES A WEEK. ALLOW TO SIT 5 MINUTES BEFORE RINSING.  1  . clobetasol cream (TEMOVATE) 0.05 % APPLY A THIN LAYER TO THE AFFECTED AREAS TWICE DAILY AS NEEDED FOR A MAX OF 4 WEEKS PER FLARE  1  . furosemide (LASIX) 40 MG tablet Take 1 tablet (40 mg total) by mouth daily. 90 tablet 1  . gabapentin (NEURONTIN) 800 MG tablet TAKE 1 TABLET (800 MG TOTAL) BY MOUTH 4 (FOUR) TIMES DAILY. 360 tablet 3  . hydroxychloroquine (PLAQUENIL) 200 MG tablet Take 200 mg by mouth 2 (two) times daily.      . hydrOXYzine (ATARAX/VISTARIL) 25 MG tablet Take 0.5-1 tablets (12.5-25 mg total) by mouth at bedtime as needed (insomnia). 30 tablet 2  . lidocaine (LIDODERM) 5 % Place 3 patches onto the skin daily. Remove & Discard patch within 12 hours or as directed by MD 90 patch 1  . methotrexate 2.5 MG tablet Take by mouth once a week.    . mometasone (ELOCON) 0.1 % ointment Apply topically Twice daily as needed.    . nitroGLYCERIN (NITROSTAT) 0.4 MG SL  tablet Place 1 tablet (0.4 mg total) under the tongue every 5 (five) minutes as needed for chest pain. 30 tablet 0  . nortriptyline (PAMELOR) 25 MG capsule Take 1 capsule (25 mg total) by mouth 3 (three) times daily. 270 capsule 3  . pantoprazole (PROTONIX) 40 MG tablet Take 1 tablet (40 mg total) by mouth daily. 90 tablet 3  . predniSONE (DELTASONE) 5 MG tablet Take 5 mg 1 tab daily, 30 days    . triamterene-hydrochlorothiazide (MAXZIDE-25) 37.5-25 MG tablet Take 1 tablet by mouth daily. 90 tablet 3   No current facility-administered medications on file prior to visit.    Social History   Socioeconomic History  . Marital status: Married    Spouse name: Not on file  . Number of children: 2  . Years of education: Not on file  . Highest education level: Not on file  Occupational History  . Occupation: Retired  Scientific laboratory technician  . Financial resource strain: Not on file  . Food insecurity:    Worry: Not on file    Inability: Not on file  . Transportation needs:    Medical: Not on file    Non-medical: Not on file  Tobacco Use  . Smoking status:  Never Smoker  . Smokeless tobacco: Never Used  Substance and Sexual Activity  . Alcohol use: No    Alcohol/week: 0.0 oz  . Drug use: No  . Sexual activity: Yes    Birth control/protection: Post-menopausal, Surgical  Lifestyle  . Physical activity:    Days per week: Not on file    Minutes per session: Not on file  . Stress: Not on file  Relationships  . Social connections:    Talks on phone: Not on file    Gets together: Not on file    Attends religious service: Not on file    Active member of club or organization: Not on file    Attends meetings of clubs or organizations: Not on file    Relationship status: Not on file  . Intimate partner violence:    Fear of current or ex partner: Not on file    Emotionally abused: Not on file    Physically abused: Not on file    Forced sexual activity: Not on file  Other Topics Concern  . Not on  file  Social History Narrative   Marital status:  Married  X 47 years, happily , no abuse.       Children:  2 children; 4 grandchildren.      Lives: with husband.      Employment: disability.  Works part-time at [employer]; working PRN.      Tobacco: never      Alcohol: wine once per month; 2-3 times per month      Drugs: none     Exercise: none     Caffeine Use: Some.    Seatbelt:  Always uses seat belts.       Smoke alarm in the home.   Exercise: Some   Family History  Problem Relation Age of Onset  . Heart disease Mother        CHF  . Heart failure Mother        CHF  . Hypertension Mother   . Stroke Mother   . Cancer Mother 30       unknown primary  . Hypertension Sister   . Crohn's disease Sister   . Arthritis Sister        partial knee replacement.  . Diabetes Daughter   . Coronary artery disease Other        family history  . Cancer Other        family history  . Breast cancer Paternal Aunt        Objective:    BP 108/72   Pulse 70   Temp 98.6 F (37 C) (Oral)   Resp 16   Ht 5\' 1"  (1.549 m)   Wt 193 lb (87.5 kg)   SpO2 98%   BMI 36.47 kg/m  Physical Exam  Constitutional: She is oriented to person, place, and time. She appears well-developed and well-nourished. No distress.  HENT:  Head: Normocephalic and atraumatic.  Right Ear: External ear normal.  Left Ear: External ear normal.  Nose: Nose normal.  Mouth/Throat: Oropharynx is clear and moist.  Eyes: Pupils are equal, round, and reactive to light. Conjunctivae and EOM are normal.  Neck: Normal range of motion. Neck supple. Carotid bruit is not present. No thyromegaly present.  Cardiovascular: Normal rate, regular rhythm, normal heart sounds and intact distal pulses. Exam reveals no gallop and no friction rub.  No murmur heard. Pulmonary/Chest: Effort normal and breath sounds normal. She has no wheezes. She has no  rales.  Abdominal: Soft. Bowel sounds are normal. She exhibits no distension and no  mass. There is no tenderness. There is no rebound and no guarding.  Musculoskeletal:       Lumbar back: She exhibits decreased range of motion and pain. She exhibits no tenderness, no bony tenderness, no spasm and normal pulse.  Lymphadenopathy:    She has no cervical adenopathy.  Neurological: She is alert and oriented to person, place, and time. No cranial nerve deficit.  Skin: Skin is warm and dry. No rash noted. She is not diaphoretic. No erythema. No pallor.  Psychiatric: She has a normal mood and affect. Her behavior is normal.   No results found. Depression screen [facility] 2/9 06/15/2017 04/27/2017 02/09/2017 10/04/2016 07/28/2016  Decreased Interest 0 0 0 0 0  Down, Depressed, Hopeless 0 0 0 0 0  PHQ - 2 Score 0 0 0 0 0   Fall Risk  06/15/2017 04/27/2017 02/09/2017 10/04/2016 07/28/2016  Falls in the past year? No No No No No        Assessment & Plan:   1. HYPERTENSION, BENIGN   2. OSA on CPAP   3. Hypokalemia   4. Gastroesophageal reflux disease without esophagitis   5. Degenerative disc disease at L5-S1 level     Acute lower back pain with DDD lumbar: s/p orthopedic consultation; s/p MRI; refill of Tramadol provided.  Referral to pain management by ortho.  Hypertension, hypokalemia, OSA, GERD: well controlled; obtain labs; continue current medications. Orders Placed This Encounter  Procedures  . CBC with Differential/Platelet  . Comprehensive metabolic panel    Order Specific Question:   Has the patient fasted?    Answer:   No  . POCT urinalysis dipstick   Meds ordered this encounter  Medications  . traMADol (ULTRAM) 50 MG tablet    Sig: Take 1 tablet (50 mg total) by mouth every 6 (six) hours as needed.    Dispense:  120 tablet    Refill:  2    No follow-ups on file.   Kristi Elayne Guerin, M.D. Primary Care at [facility] previously [facility] [place] [address] [place],   [address] [phone] phone [phone] fax

## 2017-06-29 ENCOUNTER — Other Ambulatory Visit: Payer: Self-pay | Admitting: Family Medicine

## 2017-07-19 ENCOUNTER — Telehealth: Payer: Self-pay | Admitting: Family Medicine

## 2017-07-19 NOTE — Telephone Encounter (Signed)
Pt advised to contact pharmacy for refill. Current prescription has refills available at requested pharmacy. Pt verbalized understanding.

## 2017-07-19 NOTE — Telephone Encounter (Signed)
Copied from [place] [phone]. Topic: Quick Communication - See Telephone Encounter >> Jul 19, 2017  9:32 AM Ahmed Prima L wrote: CRM for notification. See Telephone encounter for: 07/19/17.  traMADol (ULTRAM) 50 MG tablet  [id number] - [place], [place] - [address]

## 2017-07-27 ENCOUNTER — Encounter

## 2017-07-27 ENCOUNTER — Encounter: Payer: Self-pay | Admitting: Student in an Organized Health Care Education/Training Program

## 2017-07-27 ENCOUNTER — Other Ambulatory Visit: Payer: Self-pay

## 2017-07-27 ENCOUNTER — Ambulatory Visit
Payer: 59 | Attending: Student in an Organized Health Care Education/Training Program | Admitting: Student in an Organized Health Care Education/Training Program

## 2017-07-27 VITALS — BP 118/102 | HR 69 | Temp 97.0°F | Resp 18 | Ht 62.0 in | Wt 190.0 lb

## 2017-07-27 DIAGNOSIS — B029 Zoster without complications: Secondary | ICD-10-CM

## 2017-07-27 DIAGNOSIS — M353 Polymyalgia rheumatica: Secondary | ICD-10-CM | POA: Diagnosis not present

## 2017-07-27 DIAGNOSIS — M48061 Spinal stenosis, lumbar region without neurogenic claudication: Secondary | ICD-10-CM | POA: Diagnosis not present

## 2017-07-27 DIAGNOSIS — G629 Polyneuropathy, unspecified: Secondary | ICD-10-CM | POA: Insufficient documentation

## 2017-07-27 DIAGNOSIS — Z90710 Acquired absence of both cervix and uterus: Secondary | ICD-10-CM | POA: Diagnosis not present

## 2017-07-27 DIAGNOSIS — N6019 Diffuse cystic mastopathy of unspecified breast: Secondary | ICD-10-CM | POA: Insufficient documentation

## 2017-07-27 DIAGNOSIS — G4761 Periodic limb movement disorder: Secondary | ICD-10-CM | POA: Diagnosis not present

## 2017-07-27 DIAGNOSIS — Z9889 Other specified postprocedural states: Secondary | ICD-10-CM | POA: Diagnosis not present

## 2017-07-27 DIAGNOSIS — I10 Essential (primary) hypertension: Secondary | ICD-10-CM | POA: Insufficient documentation

## 2017-07-27 DIAGNOSIS — Z6834 Body mass index (BMI) 34.0-34.9, adult: Secondary | ICD-10-CM | POA: Insufficient documentation

## 2017-07-27 DIAGNOSIS — K219 Gastro-esophageal reflux disease without esophagitis: Secondary | ICD-10-CM | POA: Insufficient documentation

## 2017-07-27 DIAGNOSIS — G4733 Obstructive sleep apnea (adult) (pediatric): Secondary | ICD-10-CM | POA: Insufficient documentation

## 2017-07-27 DIAGNOSIS — E669 Obesity, unspecified: Secondary | ICD-10-CM | POA: Diagnosis not present

## 2017-07-27 DIAGNOSIS — Z79899 Other long term (current) drug therapy: Secondary | ICD-10-CM | POA: Diagnosis not present

## 2017-07-27 DIAGNOSIS — M5416 Radiculopathy, lumbar region: Secondary | ICD-10-CM | POA: Diagnosis not present

## 2017-07-27 DIAGNOSIS — Z7952 Long term (current) use of systemic steroids: Secondary | ICD-10-CM | POA: Diagnosis not present

## 2017-07-27 DIAGNOSIS — Z888 Allergy status to other drugs, medicaments and biological substances status: Secondary | ICD-10-CM | POA: Diagnosis not present

## 2017-07-27 DIAGNOSIS — L930 Discoid lupus erythematosus: Secondary | ICD-10-CM | POA: Insufficient documentation

## 2017-07-27 DIAGNOSIS — Z8261 Family history of arthritis: Secondary | ICD-10-CM | POA: Diagnosis not present

## 2017-07-27 DIAGNOSIS — Z8249 Family history of ischemic heart disease and other diseases of the circulatory system: Secondary | ICD-10-CM | POA: Diagnosis present

## 2017-07-27 DIAGNOSIS — Z833 Family history of diabetes mellitus: Secondary | ICD-10-CM | POA: Insufficient documentation

## 2017-07-27 DIAGNOSIS — M79605 Pain in left leg: Secondary | ICD-10-CM | POA: Diagnosis present

## 2017-07-27 DIAGNOSIS — Z8379 Family history of other diseases of the digestive system: Secondary | ICD-10-CM | POA: Insufficient documentation

## 2017-07-27 DIAGNOSIS — G894 Chronic pain syndrome: Secondary | ICD-10-CM | POA: Insufficient documentation

## 2017-07-27 DIAGNOSIS — G4089 Other seizures: Secondary | ICD-10-CM | POA: Diagnosis not present

## 2017-07-27 DIAGNOSIS — E7800 Pure hypercholesterolemia, unspecified: Secondary | ICD-10-CM | POA: Insufficient documentation

## 2017-07-27 DIAGNOSIS — Z823 Family history of stroke: Secondary | ICD-10-CM | POA: Insufficient documentation

## 2017-07-27 NOTE — Progress Notes (Signed)
Safety precautions to be maintained throughout the outpatient stay will include: orient to surroundings, keep bed in low position, maintain call bell within reach at all times, provide assistance with transfer out of bed and ambulation.  

## 2017-07-27 NOTE — Patient Instructions (Addendum)
GENERAL RISKS AND COMPLICATIONS  What are the risk, side effects and possible complications? Generally speaking, most procedures are safe.  However, with any procedure there are risks, side effects, and the possibility of complications.  The risks and complications are dependent upon the sites that are lesioned, or the type of nerve block to be performed.  The closer the procedure is to the spine, the more serious the risks are.  Great care is taken when placing the radio frequency needles, block needles or lesioning probes, but sometimes complications can occur. 1. Infection: Any time there is an injection through the skin, there is a risk of infection.  This is why sterile conditions are used for these blocks.  There are four possible types of infection. 1. Localized skin infection. 2. Central Nervous System Infection-This can be in the form of Meningitis, which can be deadly. 3. Epidural Infections-This can be in the form of an epidural abscess, which can cause pressure inside of the spine, causing compression of the spinal cord with subsequent paralysis. This would require an emergency surgery to decompress, and there are no guarantees that the patient would recover from the paralysis. 4. Discitis-This is an infection of the intervertebral discs.  It occurs in about 1% of discography procedures.  It is difficult to treat and it may lead to surgery.        2. Pain: the needles have to go through skin and soft tissues, will cause soreness.       3. Damage to internal structures:  The nerves to be lesioned may be near blood vessels or    other nerves which can be potentially damaged.       4. Bleeding: Bleeding is more common if the patient is taking blood thinners such as  aspirin, Coumadin, Ticiid, Plavix, etc., or if he/she have some genetic predisposition  such as hemophilia. Bleeding into the spinal canal can cause compression of the spinal  cord with subsequent paralysis.  This would require an  emergency surgery to  decompress and there are no guarantees that the patient would recover from the  paralysis.       5. Pneumothorax:  Puncturing of a lung is a possibility, every time a needle is introduced in  the area of the chest or upper back.  Pneumothorax refers to free air around the  collapsed lung(s), inside of the thoracic cavity (chest cavity).  Another two possible  complications related to a similar event would include: Hemothorax and Chylothorax.   These are variations of the Pneumothorax, where instead of air around the collapsed  lung(s), you may have blood or chyle, respectively.       6. Spinal headaches: They may occur with any procedures in the area of the spine.       7. Persistent CSF (Cerebro-Spinal Fluid) leakage: This is a rare problem, but may occur  with prolonged intrathecal or epidural catheters either due to the formation of a fistulous  track or a dural tear.       8. Nerve damage: By working so close to the spinal cord, there is always a possibility of  nerve damage, which could be as serious as a permanent spinal cord injury with  paralysis.       9. Death:  Although rare, severe deadly allergic reactions known as "Anaphylactic  reaction" can occur to any of the medications used.      10. Worsening of the symptoms:  We can always make thing worse.    What are the chances of something like this happening? Chances of any of this occuring are extremely low.  By statistics, you have more of a chance of getting killed in a motor vehicle accident: while driving to the hospital than any of the above occurring .  Nevertheless, you should be aware that they are possibilities.  In general, it is similar to taking a shower.  Everybody knows that you can slip, hit your head and get killed.  Does that mean that you should not shower again?  Nevertheless always keep in mind that statistics do not mean anything if you happen to be on the wrong side of them.  Even if a procedure has a 1  (one) in a 1,000,000 (million) chance of going wrong, it you happen to be that one..Also, keep in mind that by statistics, you have more of a chance of having something go wrong when taking medications.  Who should not have this procedure? If you are on a blood thinning medication (e.g. Coumadin, Plavix, see list of "Blood Thinners"), or if you have an active infection going on, you should not have the procedure.  If you are taking any blood thinners, please inform your physician.  How should I prepare for this procedure?  Do not eat or drink anything at least six hours prior to the procedure.  Bring a driver with you .  It cannot be a taxi.  Come accompanied by an adult that can drive you back, and that is strong enough to help you if your legs get weak or numb from the local anesthetic.  Take all of your medicines the morning of the procedure with just enough water to swallow them.  If you have diabetes, make sure that you are scheduled to have your procedure done first thing in the morning, whenever possible.  If you have diabetes, take only half of your insulin dose and notify our nurse that you have done so as soon as you arrive at the clinic.  If you are diabetic, but only take blood sugar pills (oral hypoglycemic), then do not take them on the morning of your procedure.  You may take them after you have had the procedure.  Do not take aspirin or any aspirin-containing medications, at least eleven (11) days prior to the procedure.  They may prolong bleeding.  Wear loose fitting clothing that may be easy to take off and that you would not mind if it got stained with Betadine or blood.  Do not wear any jewelry or perfume  Remove any nail coloring.  It will interfere with some of our monitoring equipment.  NOTE: Remember that this is not meant to be interpreted as a complete list of all possible complications.  Unforeseen problems may occur.  BLOOD THINNERS The following drugs  contain aspirin or other products, which can cause increased bleeding during surgery and should not be taken for 2 weeks prior to and 1 week after surgery.  If you should need take something for relief of minor pain, you may take acetaminophen which is found in Tylenol,m Datril, Anacin-3 and Panadol. It is not blood thinner. The products listed below are.  Do not take any of the products listed below in addition to any listed on your instruction sheet.  A.P.C or A.P.C with Codeine Codeine Phosphate Capsules #3 Ibuprofen Ridaura  ABC compound Congesprin Imuran rimadil  Advil Cope Indocin Robaxisal  Alka-Seltzer Effervescent Pain Reliever and Antacid Coricidin or Coricidin-D  Indomethacin Rufen    Alka-Seltzer plus Cold Medicine Cosprin Ketoprofen S-A-C Tablets  Anacin Analgesic Tablets or Capsules Coumadin Korlgesic Salflex  Anacin Extra Strength Analgesic tablets or capsules CP-2 Tablets Lanoril Salicylate  Anaprox Cuprimine Capsules Levenox Salocol  Anexsia-D Dalteparin Magan Salsalate  Anodynos Darvon compound Magnesium Salicylate Sine-off  Ansaid Dasin Capsules Magsal Sodium Salicylate  Anturane Depen Capsules Marnal Soma  APF Arthritis pain formula Dewitt's Pills Measurin Stanback  Argesic Dia-Gesic Meclofenamic Sulfinpyrazone  Arthritis Bayer Timed Release Aspirin Diclofenac Meclomen Sulindac  Arthritis pain formula Anacin Dicumarol Medipren Supac  Analgesic (Safety coated) Arthralgen Diffunasal Mefanamic Suprofen  Arthritis Strength Bufferin Dihydrocodeine Mepro Compound Suprol  Arthropan liquid Dopirydamole Methcarbomol with Aspirin Synalgos  ASA tablets/Enseals Disalcid Micrainin Tagament  Ascriptin Doan's Midol Talwin  Ascriptin A/D Dolene Mobidin Tanderil  Ascriptin Extra Strength Dolobid Moblgesic Ticlid  Ascriptin with Codeine Doloprin or Doloprin with Codeine Momentum Tolectin  Asperbuf Duoprin Mono-gesic Trendar  Aspergum Duradyne Motrin or Motrin IB Triminicin  Aspirin  plain, buffered or enteric coated Durasal Myochrisine Trigesic  Aspirin Suppositories Easprin Nalfon Trillsate  Aspirin with Codeine Ecotrin Regular or Extra Strength Naprosyn Uracel  Atromid-S Efficin Naproxen Ursinus  Auranofin Capsules Elmiron Neocylate Vanquish  Axotal Emagrin Norgesic Verin  Azathioprine Empirin or Empirin with Codeine Normiflo Vitamin E  Azolid Emprazil Nuprin Voltaren  Bayer Aspirin plain, buffered or children's or timed BC Tablets or powders Encaprin Orgaran Warfarin Sodium  Buff-a-Comp Enoxaparin Orudis Zorpin  Buff-a-Comp with Codeine Equegesic Os-Cal-Gesic   Buffaprin Excedrin plain, buffered or Extra Strength Oxalid   Bufferin Arthritis Strength Feldene Oxphenbutazone   Bufferin plain or Extra Strength Feldene Capsules Oxycodone with Aspirin   Bufferin with Codeine Fenoprofen Fenoprofen Pabalate or Pabalate-SF   Buffets II Flogesic Panagesic   Buffinol plain or Extra Strength Florinal or Florinal with Codeine Panwarfarin   Buf-Tabs Flurbiprofen Penicillamine   Butalbital Compound Four-way cold tablets Penicillin   Butazolidin Fragmin Pepto-Bismol   Carbenicillin Geminisyn Percodan   Carna Arthritis Reliever Geopen Persantine   Carprofen Gold's salt Persistin   Chloramphenicol Goody's Phenylbutazone   Chloromycetin Haltrain Piroxlcam   Clmetidine heparin Plaquenil   Cllnoril Hyco-pap Ponstel   Clofibrate Hydroxy chloroquine Propoxyphen         Before stopping any of these medications, be sure to consult the physician who ordered them.  Some, such as Coumadin (Warfarin) are ordered to prevent or treat serious conditions such as "deep thrombosis", "pumonary embolisms", and other heart problems.  The amount of time that you may need off of the medication may also vary with the medication and the reason for which you were taking it.  If you are taking any of these medications, please make sure you notify your pain physician before you undergo any  procedures.         Epidural Steroid Injection Patient Information  Description: The epidural space surrounds the nerves as they exit the spinal cord.  In some patients, the nerves can be compressed and inflamed by a bulging disc or a tight spinal canal (spinal stenosis).  By injecting steroids into the epidural space, we can bring irritated nerves into direct contact with a potentially helpful medication.  These steroids act directly on the irritated nerves and can reduce swelling and inflammation which often leads to decreased pain.  Epidural steroids may be injected anywhere along the spine and from the neck to the low back depending upon the location of your pain.   After numbing the skin with local anesthetic (like Novocaine), a small needle is passed   into the epidural space slowly.  You may experience a sensation of pressure while this is being done.  The entire block usually last less than 10 minutes.  Conditions which may be treated by epidural steroids:   Low back and leg pain  Neck and arm pain  Spinal stenosis  Post-laminectomy syndrome  Herpes zoster (shingles) pain  Pain from compression fractures  Preparation for the injection:  1. Do not eat any solid food or dairy products within 8 hours of your appointment.  2. You may drink clear liquids up to 3 hours before appointment.  Clear liquids include water, black coffee, juice or soda.  No milk or cream please. 3. You may take your regular medication, including pain medications, with a sip of water before your appointment  Diabetics should hold regular insulin (if taken separately) and take 1/2 normal NPH dos the morning of the procedure.  Carry some sugar containing items with you to your appointment. 4. A driver must accompany you and be prepared to drive you home after your procedure.  5. Bring all your current medications with your. 6. An IV may be inserted and sedation may be given at the discretion of the  physician.   7. A blood pressure cuff, EKG and other monitors will often be applied during the procedure.  Some patients may need to have extra oxygen administered for a short period. 8. You will be asked to provide medical information, including your allergies, prior to the procedure.  We must know immediately if you are taking blood thinners (like Coumadin/Warfarin)  Or if you are allergic to IV iodine contrast (dye). We must know if you could possible be pregnant.  Possible side-effects:  Bleeding from needle site  Infection (rare, may require surgery)  Nerve injury (rare)  Numbness & tingling (temporary)  Difficulty urinating (rare, temporary)  Spinal headache ( a headache worse with upright posture)  Light -headedness (temporary)  Pain at injection site (several days)  Decreased blood pressure (temporary)  Weakness in arm/leg (temporary)  Pressure sensation in back/neck (temporary)  Call if you experience:  Fever/chills associated with headache or increased back/neck pain.  Headache worsened by an upright position.  New onset weakness or numbness of an extremity below the injection site  Hives or difficulty breathing (go to the emergency room)  Inflammation or drainage at the infection site  Severe back/neck pain  Any new symptoms which are concerning to you  Please note:  Although the local anesthetic injected can often make your back or neck feel good for several hours after the injection, the pain will likely return.  It takes 3-7 days for steroids to work in the epidural space.  You may not notice any pain relief for at least that one week.  If effective, we will often do a series of three injections spaced 3-6 weeks apart to maximally decrease your pain.  After the initial series, we generally will wait several months before considering a repeat injection of the same type.  If you have any questions, please call [phone] [facility] 

## 2017-07-27 NOTE — Progress Notes (Signed)
Patient's Name: Maureen Hodge  MRN: [id number]  Referring Provider: Diamond Nickel, MD  DOB: 1950/09/26  PCP: Wardell Honour, MD  DOS: 07/27/2017  Note by: Gillis Santa, MD  Service setting: Ambulatory outpatient  Specialty: Interventional Pain Management  Location: [facility] (AMB) Pain Management Facility  Visit type: Initial Patient Evaluation  Patient type: New Patient   Primary Reason(s) for Visit: Encounter for initial evaluation of one or more chronic problems (new to examiner) potentially causing chronic pain, and posing a threat to normal musculoskeletal function. (Level of risk: High) CC: Leg Pain (left) and Back Pain  HPI  Maureen Hodge is a 67 y.o. year old, female patient, who comes today to see [place] for the first time for an initial evaluation of her chronic pain. 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 Today she comes in for evaluation of her Leg Pain (left) and Back Pain  Pain Assessment: Location: Left Leg Radiating: pain radiates from left lower leg and down on the side Onset: More than a month ago Duration: Chronic pain Quality: Nagging, Sharp, Shooting Severity: 10-Worst pain ever/10 (subjective, self-reported pain score)  Note: Reported level is inconsistent with clinical observations. Clinically the patient looks like a 3/10 A 3/10 is viewed as "Moderate" and described as significantly interfering with activities of daily living (ADL). It becomes difficult to feed, bathe, get dressed, get on and off the toilet or to perform personal hygiene functions. Difficult to get in and out of bed  or a chair without assistance. Very distracting. With effort, it can be ignored when deeply involved in activities.       When using our objective Pain Scale, levels between 6 and 10/10 are said to belong in an emergency room, as it progressively worsens from a 6/10, described as severely limiting, requiring emergency care not usually available at an outpatient pain management facility. At a 6/10 level, communication becomes difficult and requires great effort. Assistance to reach the emergency department may be required. Facial flushing and profuse sweating along with potentially dangerous increases in heart rate and blood pressure will be evident. Effect on ADL: "I cant even lay down at night" Timing: Constant Modifying factors: denies BP: (!) 118/102  HR: 69  Onset and Duration: Gradual and Present longer than 3 months Cause of pain: Unknown Severity: Getting worse and NAS-11 at its worse: 10/10 Timing: Not influenced by the time of the day Aggravating Factors: Prolonged sitting and Prolonged standing Alleviating Factors: Lying down and Medications Associated Problems: Color changes and Sweating Quality of Pain: Uncomfortable Previous Examinations or Tests: MRI scan Previous Treatments: Chiropractic manipulations  The patient comes into the clinics today for the first time for a chronic pain management evaluation.   67 year old female with a history of left hip and left leg pain that starts primarily at her left distal thigh and extends down to her left ankle in a dermatomal fashion.  Patient is status post greater trochanteric bursa injections on 05/13/2017 with Dr. Candelaria Stagers which were not effective for her hip pain.  Patient does have a history of shingles last year.  She has had 2 previous lumbar spine surgeries including history of L5-S1 foraminotomy as well as  left L5-S1 laminectomy.  Patient is referred here from Dr. Candelaria Stagers for consideration of lumbar epidural steroid injection and  possible spinal cord stimulation for her radicular pain.   Patient to continue medication management with PCP.    Today I took the time to provide the patient with information regarding my pain practice. The patient was informed that my practice is divided into two sections: an interventional pain management section, as well as a completely separate and distinct medication management section. I explained that I have procedure days for my interventional therapies, and evaluation days for follow-ups and medication management. Because of the amount of documentation required during both, they are kept separated. This means that there is the possibility that she may be scheduled for a procedure on one day, and medication management the next. I have also informed her that because of staffing and facility limitations, I no longer take patients for medication management only. To illustrate the reasons for this, I gave the patient the example of surgeons, and how inappropriate it would be to refer a patient to his/her care, just to write for the post-surgical antibiotics on a surgery done by a different surgeon.   Because interventional pain management is my board-certified specialty, the patient was informed that joining my practice means that they are open to any and all interventional therapies. I made it clear that this does not mean that they will be forced to have any procedures done. What this means is that I believe interventional therapies to be essential part of the diagnosis and proper management of chronic pain conditions. Therefore, patients not interested in these interventional alternatives will be better served under the care of a different practitioner.  The patient was also made aware of my Comprehensive Pain Management Safety Guidelines where by joining my practice, they limit all of their nerve blocks and joint injections to those done by our practice, for as long as we are retained to manage  their care.   Historic Controlled Substance Pharmacotherapy Review  PMP and historical list of controlled substances: Tramadol 50 mg, quantity 120, last fill 07/19/2017 MME/day: 20 mg/day Medications: The patient did not bring the medication(s) to the appointment, as requested in our "New Patient Package" Pharmacodynamics: Desired effects: Analgesia: The patient reports >50% benefit. Reported improvement in function: The patient reports medication allows her to accomplish basic ADLs. Clinically meaningful improvement in function (CMIF): Sustained CMIF goals met Perceived effectiveness: Described as relatively effective, allowing for increase in activities of daily living (ADL) Undesirable effects: Side-effects or Adverse reactions: None reported Historical Monitoring: The patient  reports that she does not use drugs. List of all UDS Test(s): No results found for: MDMA, COCAINSCRNUR, [place], [place], CANNABQUANT, [place], [place] List of other Serum/Urine Drug Screening Test(s):  No results found for: AMPHSCRSER, BARBSCRSER, BENZOSCRSER, COCAINSCRSER, COCAINSCRNUR, PCPSCRSER, PCPQUANT, THCSCRSER, THCU, CANNABQUANT, OPIATESCRSER, OXYSCRSER, PROPOXSCRSER, ETH Historical Background Evaluation: [place] PMP: Six (6) year initial data search conducted.             [place] Department of public safety, offender search: Editor, commissioning Information) Non-contributory Risk Assessment Profile: Aberrant behavior: None observed or detected today Risk factors for fatal opioid overdose: None identified today Fatal overdose hazard ratio (HR): Calculation deferred Non-fatal overdose hazard ratio (HR): Calculation deferred Risk of opioid abuse or dependence: 0.7-3.0% with doses ? 36 MME/day and 6.1-26% with doses ? 120 MME/day. Substance use disorder (SUD) risk level: See below Opioid risk tool (ORT) (Total Score): 0 Opioid Risk Tool - 07/27/17 1333  Family History of Substance Abuse   Alcohol  Negative    Illegal Drugs   Negative    Rx Drugs  Negative      Personal History of Substance Abuse   Alcohol  Negative    Illegal Drugs  Negative    Rx Drugs  Negative      Age   Age between 36-45 years   No      Psychological Disease   Psychological Disease  Negative    Depression  Negative      Total Score   Opioid Risk Tool Scoring  0    Opioid Risk Interpretation  Low Risk      ORT Scoring interpretation table:  Score <3 = Low Risk for SUD  Score between 4-7 = Moderate Risk for SUD  Score >8 = High Risk for Opioid Abuse   PHQ-2 Depression Scale:  Total score: 0  PHQ-2 Scoring interpretation table: (Score and probability of major depressive disorder)  Score 0 = No depression  Score 1 = 15.4% Probability  Score 2 = 21.1% Probability  Score 3 = 38.4% Probability  Score 4 = 45.5% Probability  Score 5 = 56.4% Probability  Score 6 = 78.6% Probability   PHQ-9 Depression Scale:  Total score: 0  PHQ-9 Scoring interpretation table:  Score 0-4 = No depression  Score 5-9 = Mild depression  Score 10-14 = Moderate depression  Score 15-19 = Moderately severe depression  Score 20-27 = Severe depression (2.4 times higher risk of SUD and 2.89 times higher risk of overuse)   Pharmacologic Plan: Medication management to continue with PCP.  We will focus on interventional therapies.            Initial impression: Pending review of available data and ordered tests.  Meds   Current Outpatient Medications:  .  atenolol (TENORMIN) 50 MG tablet, Take 1 tablet (50 mg total) by mouth 2 (two) times daily., Disp: 180 tablet, Rfl: 3 .  cetirizine (ZYRTEC) 10 MG tablet, TAKE 1 TABLET (10 MG TOTAL) BY MOUTH DAILY., Disp: 90 tablet, Rfl: 3 .  Ciclopirox 1 % shampoo, LATHER TO SCALP 1-2 TIMES A WEEK. ALLOW TO SIT 5 MINUTES BEFORE RINSING., Disp: , Rfl: 1 .  clobetasol cream (TEMOVATE) 0.05 %, APPLY A THIN LAYER TO THE AFFECTED AREAS TWICE DAILY AS NEEDED FOR A MAX OF 4 WEEKS PER FLARE, Disp: , Rfl: 1 .  furosemide  (LASIX) 40 MG tablet, Take 1 tablet (40 mg total) by mouth daily. (Patient taking differently: Take 40 mg by mouth daily as needed. ), Disp: 90 tablet, Rfl: 1 .  gabapentin (NEURONTIN) 800 MG tablet, TAKE 1 TABLET (800 MG TOTAL) BY MOUTH 4 (FOUR) TIMES DAILY., Disp: 360 tablet, Rfl: 3 .  hydroxychloroquine (PLAQUENIL) 200 MG tablet, Take 200 mg by mouth 2 (two) times daily.  , Disp: , Rfl:  .  hydrOXYzine (ATARAX/VISTARIL) 25 MG tablet, Take 0.5-1 tablets (12.5-25 mg total) by mouth at bedtime as needed (insomnia)., Disp: 30 tablet, Rfl: 2 .  lidocaine (LIDODERM) 5 %, Place 3 patches onto the skin daily. Remove & Discard patch within 12 hours or as directed by MD, Disp: 90 patch, Rfl: 1 .  methotrexate 2.5 MG tablet, Take by mouth once a week., Disp: , Rfl:  .  mometasone (ELOCON) 0.1 % ointment, Apply topically Twice daily as needed., Disp: , Rfl:  .  nitroGLYCERIN (NITROSTAT) 0.4 MG SL tablet, Place 1 tablet (0.4 mg total) under the tongue every  5 (five) minutes as needed for chest pain., Disp: 30 tablet, Rfl: 0 .  nortriptyline (PAMELOR) 25 MG capsule, Take 1 capsule (25 mg total) by mouth 3 (three) times daily., Disp: 270 capsule, Rfl: 3 .  pantoprazole (PROTONIX) 40 MG tablet, Take 1 tablet (40 mg total) by mouth daily., Disp: 90 tablet, Rfl: 3 .  predniSONE (DELTASONE) 5 MG tablet, Take 5 mg 1 tab daily, 30 days, Disp: , Rfl:  .  traMADol (ULTRAM) 50 MG tablet, Take 1 tablet (50 mg total) by mouth every 6 (six) hours as needed., Disp: 120 tablet, Rfl: 2 .  triamterene-hydrochlorothiazide (MAXZIDE-25) 37.5-25 MG tablet, Take 1 tablet by mouth daily., Disp: 90 tablet, Rfl: 3  Imaging Review   Lumbosacral Imaging: Lumbar MR wo contrast:  Results for orders placed during the hospital encounter of 06/09/17  MR LUMBAR SPINE WO CONTRAST   Narrative CLINICAL DATA:  Left hip pain. Lumbar stenosis with neurogenic claudication. Osteoarthritis of the spine with radiculopathy, lumbar region.  Low  back pain extending into left lower extremity.  EXAM: MRI LUMBAR SPINE WITHOUT CONTRAST  TECHNIQUE: Multiplanar, multisequence MR imaging of the lumbar spine was performed. No intravenous contrast was administered.  COMPARISON:  Lumbar spine radiographs 07/28/2016. MRI of the lumbar spine 11/06/2014.  FINDINGS: Segmentation: Transitional anatomy is present. A transitional S1 segment is present. Anterolisthesis is at L5-S1.  Alignment: Grade 1 anterolisthesis at L2-3 measures 8 mm, slightly increased from the prior exam. AP alignment is otherwise anatomic.  Vertebrae: Mild endplate marrow changes have progressed at L5-S1. Marrow signal and vertebral body heights are otherwise normal.  Conus medullaris and cauda equina: Conus extends to the L1 level. Conus and cauda equina appear normal.  Paraspinal and other soft tissues: Limited imaging of the abdomen is unremarkable. There is no significant adenopathy.  Disc levels:  L1-2: Mild facet hypertrophy is present bilaterally. There is no focal disc protrusion or stenosis.  L2-3: Mild facet hypertrophy is present bilaterally. There is no focal disc protrusion or stenosis.  L3-4: Facet hypertrophy is asymmetric on the right. Mild disc bulging is present. The combination results in progressive mild right foraminal stenosis. The central canal is patent.  L4-5: A rightward disc protrusion is present. Moderate facet hypertrophy has progressed, right greater than left. No significant stenosis is present.  L5-S1: Left laminectomy is again noted. Advanced facet hypertrophy is present. Moderate subarticular narrowing is present bilaterally. Severe left and moderate right foraminal stenosis has progressed.  IMPRESSION: 1. Advanced facet hypertrophy with progressive anterolisthesis and severe left foraminal stenosis at L5-S1. 2. Moderate bilateral subarticular narrowing at L5-S1 may impact the S1 nerve roots as well. 3. Progressive  moderate right foraminal stenosis at L5-S1. 4. Progressive facet hypertrophy at L4-5 without significant stenosis. 5. Progressive right foraminal narrowing at L3-4 secondary to a rightward disc protrusion and facet hypertrophy.   Electronically Signed   By: San Morelle M.D.   On: 06/09/2017 10:54     Lumbar DG (Complete) 4+V:  Results for orders placed in visit on 07/28/16  DG Lumbar Spine Complete   Narrative CLINICAL DATA:  Right lower back pain .  EXAM: LUMBAR SPINE - COMPLETE 4+ VIEW  COMPARISON:  MRI 11/06/2014 .  FINDINGS: Postsurgical changes lumbar spine. Diffuse degenerative change with prominent disc space loss at L5-S1. Associated 6 mm anterolisthesis again noted at this level. No interim change from prior exam. No acute bony abnormality identified. Punctate calcification in the right kidney. Tiny right renal stone cannot be excluded.  Punctate calcification right flank. Right urolithiasis cannot be excluded. Pelvic calcifications noted consistent phleboliths. Distal ureteral stones cannot be excluded. Prominent stool noted throughout the colon. Constipation cannot be excluded .  IMPRESSION: 1. Postsurgical changes lower lumbar spine. Diffuse degenerative change. Prominent disc space loss and endplate osteophyte formation L5-S1. Stable 6 mm anterolisthesis L5-S1 . No acute bony abnormality identified.  2. Punctate calcifications in the right kidney. Right renal stone cannot be excluded. Punctate calcific density noted over the right flank. A right ureteral stone cannot be excluded. Punctate calcifications in the pelvis most likely phleboliths. Distal ureteral stones cannot be excluded.  3. Prominent amount of stool noted throughout the colon. Constipation cannot be excluded .   Electronically Signed   By: Marcello Moores  Register   On: 07/28/2016 13:08     Lumbar DG Epidurogram OP:  Results for orders placed during the hospital encounter of 03/20/03   DG Epidurography   Narrative Clinical Data: Back pain.  The patient did not significant relief from the last injection. I elected to try a caudal approach today.  CAUDAL EPIDURAL  Following informed consent, sterile preparation of the back, and adequate local anesthesia, a 22 gauge spinal needle was placed in the caudal epidural space. Contrast injection showed good cephalad spread to L5.      I injected 120 mg Depo-Medrol along 3 cc of 1% Lidocaine. Post procedure, the patient was comfortable.  IMPRESSION  Technically successful caudal epidural #3. This completes a series of injections in this patient.                Provider: Candis Schatz    Complexity Note: Imaging results reviewed. Results shared with Ms. Hambly, using Layman's terms.                         ROS  Cardiovascular: High blood pressure Pulmonary or Respiratory: No reported pulmonary signs or symptoms such as wheezing and difficulty taking a deep full breath (Asthma), difficulty blowing air out (Emphysema), coughing up mucus (Bronchitis), persistent dry cough, or temporary stoppage of breathing during sleep Neurological: Seizures (Epilepsy) Review of Past Neurological Studies: No results found for this or any previous visit. Psychological-Psychiatric: No reported psychological or psychiatric signs or symptoms such as difficulty sleeping, anxiety, depression, delusions or hallucinations (schizophrenial), mood swings (bipolar disorders) or suicidal ideations or attempts Gastrointestinal: Vomiting blood (Ulcers), Reflux or heatburn and No reported gastrointestinal signs or symptoms such as vomiting or evacuating blood, reflux, heartburn, alternating episodes of diarrhea and constipation, inflamed or scarred liver, or pancreas or irrregular and/or infrequent bowel movements Genitourinary: No reported renal or genitourinary signs or symptoms such as difficulty voiding or producing urine, peeing blood, non-functioning  kidney, kidney stones, difficulty emptying the bladder, difficulty controlling the flow of urine, or chronic kidney disease Hematological: No reported hematological signs or symptoms such as prolonged bleeding, low or poor functioning platelets, bruising or bleeding easily, hereditary bleeding problems, low energy levels due to low hemoglobin or being anemic Endocrine: No reported endocrine signs or symptoms such as high or low blood sugar, rapid heart rate due to high thyroid levels, obesity or weight gain due to slow thyroid or thyroid disease Rheumatologic: Butterfly-like facial rash (Lupus) and Rheumatoid arthritis Musculoskeletal: Negative for myasthenia gravis, muscular dystrophy, multiple sclerosis or malignant hyperthermia Work History:unknown  Allergies  Ms. Wombles is allergic to lisinopril.  Laboratory Chemistry  Inflammation Markers (CRP: Acute Phase) (ESR: Chronic Phase) No results found for: CRP, ESRSEDRATE, LATICACIDVEN  Rheumatology Markers No results found for: RF, ANA, LABURIC, URICUR, LYMEIGGIGMAB, LYMEABIGMQN, HLAB27                      Renal Function Markers Lab Results  Component Value Date   BUN 9 06/15/2017   CREATININE 0.87 06/15/2017   BCR 10 (L) 06/15/2017   GFRAA 81 06/15/2017   GFRNONAA 71 06/15/2017                             Hepatic Function Markers Lab Results  Component Value Date   AST 24 06/15/2017   ALT 14 06/15/2017   ALBUMIN 3.9 06/15/2017   ALKPHOS 89 06/15/2017   HCVAB NEGATIVE 11/15/2014   LIPASE 24 12/16/2016                        Electrolytes Lab Results  Component Value Date   NA 144 06/15/2017   K 3.3 (L) 06/15/2017   CL 100 06/15/2017   CALCIUM 9.3 06/15/2017   MG 1.5 11/05/2015                        Neuropathy Markers Lab Results  Component Value Date   VITAMINB12 471 01/24/2012   HGBA1C 6.2 (H) 02/09/2017   HIV NONREACTIVE 11/15/2014                        Bone Pathology Markers Lab  Results  Component Value Date   VD25OH 43 01/24/2012                         Coagulation Parameters Lab Results  Component Value Date   PLT 268 06/15/2017                        Cardiovascular Markers Lab Results  Component Value Date   BNP 15.4 07/22/2015   TROPONINI <0.03 12/16/2016   HGB 12.3 06/15/2017   HCT 36.4 06/15/2017                         CA Markers No results found for: CEA, CA125, LABCA2                      Note: Lab results reviewed.  [place]  Drug: Ms. Rhoads  reports that she does not use drugs. Alcohol:  reports that she does not drink alcohol. Tobacco:  reports that she has never smoked. She has never used smokeless tobacco. Medical:  has a past medical history of Allergy, Arthritis, Brain tumor ([place]) (05/2011), Cerebral aneurysm, nonruptured, Diabetes mellitus, Diffuse cystic mastopathy, Esophageal reflux, Hypertension, Hypopotassemia (06/01/2007), Intervertebral disc disorder with myelopathy, unspecified region, Lupus erythematosus, Migraines, Other convulsions, PLMD (periodic limb movement disorder), Presence of dental bridge, S/P bunionectomy (09/28/2013), and Unspecified sleep apnea. Family: family history includes Arthritis in her sister; Breast cancer in her paternal aunt; Cancer in her other; Cancer (age of onset: 71) in her mother; Coronary artery disease in her other; Crohn's disease in her sister; Diabetes in her daughter; Heart disease in her mother; Heart failure in her mother; Hypertension in her mother and sister; Stroke in her mother.  Past Surgical History:  Procedure Laterality Date  . BRAIN AVM REPAIR    . BRAIN SURGERY  2013  . BREAST  BIOPSY Bilateral    neg  . BREAST CYST ASPIRATION Bilateral   . BREAST SURGERY     x 5 due to fibrocystic disease  . COLONOSCOPY WITH PROPOFOL N/A 07/12/2016   Procedure: COLONOSCOPY WITH PROPOFOL;  Surgeon: Lucilla Lame, MD;  Location: [facility];  Service: Endoscopy;  Laterality: N/A;  sleep apnea   . DILATION AND CURETTAGE OF UTERUS     x 5  . FOOT SURGERY    . HEMORRHOID SURGERY    . HERNIA REPAIR    . lumbar surgery  2000  . POLYPECTOMY  07/12/2016   Procedure: POLYPECTOMY;  Surgeon: Lucilla Lame, MD;  Location: [facility];  Service: Endoscopy;;  . SPINE SURGERY    . TONSILLECTOMY AND ADENOIDECTOMY    . TOTAL ABDOMINAL HYSTERECTOMY  1979   postpartum hemorrhage; ovaries intact.   Active Ambulatory Problems    Diagnosis Date Noted  . HYPERTENSION, BENIGN 03/20/2009  . EDEMA 05/21/2009  . Hypokalemia 10/25/2011  . GERD (gastroesophageal reflux disease) 10/25/2011  . Meningioma ([place]) 10/25/2011  . 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 Benign neoplasm of cerebral meninges ([place]) 06/01/2011  . Benign cerebral hemangioma ([place]) 06/22/2001  . Lumbar canal stenosis 08/22/2012  . OSA on CPAP 07/10/2014  . 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 Obesity 06/01/2011  . Benign neoplasm of ascending colon   . Benign neoplasm of cecum   . Polymyalgia rheumatica syndrome ([place]) 09/01/2016   Resolved Ambulatory Problems    Diagnosis Date Noted  . SLEEP RELATED MOVEMENT DISORDER UNSPECIFIED 03/20/2009  . CHEST PAIN-UNSPECIFIED 03/20/2009  . PRE-DIABETES 03/20/2009  . Low oxygen saturation 08/17/2011  . Routine general medical examination at a health care facility 01/24/2012  . Routine gynecological examination 01/24/2012  . Anemia 01/24/2012  . Pure hypercholesterolemia 01/24/2012  . Pain of left lower leg 01/24/2012  . Need for Zostavax administration 01/23/2013  . Cough 01/23/2013  . 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 Cephalalgia 04/11/2011  . Hemangioma of intracranial structure ([place]) 06/22/2001  . Glucose intolerance (impaired glucose tolerance) 11/05/2015  . Special screening for malignant neoplasms, colon    Past Medical History:  Diagnosis Date  . Allergy   . Arthritis   . Brain tumor ([place]) 05/2011   . Cerebral aneurysm, nonruptured   . Diabetes mellitus   . Diffuse cystic mastopathy   . Esophageal reflux   . Hypertension   . Hypopotassemia 06/01/2007  . Intervertebral disc disorder with myelopathy, unspecified region   . Lupus erythematosus   . Migraines   . Other convulsions   . PLMD (periodic limb movement disorder)   . Presence of dental bridge   . S/P bunionectomy 09/28/2013  . Unspecified sleep apnea    Constitutional Exam  General appearance: Well nourished, well developed, and well hydrated. In no apparent acute distress Vitals:   07/27/17 1324  BP: (!) 118/102  Pulse: 69  Resp: 18  Temp: (!) 97 F (36.1 C)  SpO2: 98%  Weight: 190 lb (86.2 kg)  Height: '5\' 2"'$  (1.575 m)   BMI Assessment: Estimated body mass index is 34.75 kg/m as calculated from the following:   Height as of this encounter: '5\' 2"'$  (1.575 m).   Weight as of this encounter: 190 lb (86.2 kg).  BMI interpretation table: BMI level Category Range association with higher incidence of chronic pain  <18 kg/m2 Underweight   18.5-24.9 kg/m2 Ideal body weight   25-29.9 kg/m2 Overweight Increased incidence  by 20%  30-34.9 kg/m2 Obese (Class I) Increased incidence by 68%  35-39.9 kg/m2 Severe obesity (Class II) Increased incidence by 136%  >40 kg/m2 Extreme obesity (Class III) Increased incidence by 254%   Patient's current BMI Ideal Body weight  Body mass index is 34.75 kg/m. Ideal body weight: 50.1 kg (110 lb 7.2 oz) Adjusted ideal body weight: 64.5 kg (142 lb 4.3 oz)   BMI Readings from Last 4 Encounters:  07/27/17 34.75 kg/m  06/15/17 36.47 kg/m  04/27/17 36.21 kg/m  02/09/17 35.79 kg/m   Wt Readings from Last 4 Encounters:  07/27/17 190 lb (86.2 kg)  06/15/17 193 lb (87.5 kg)  04/27/17 193 lb (87.5 kg)  02/09/17 192 lb (87.1 kg)  Psych/Mental status: Alert, oriented x 3 (person, place, & time)       Eyes: PERLA Respiratory: No evidence of acute respiratory distress  Cervical Spine  Area Exam  Skin & Axial Inspection: No masses, redness, edema, swelling, or associated skin lesions Alignment: Symmetrical Functional ROM: Unrestricted ROM      Stability: No instability detected Muscle Tone/Strength: Functionally intact. No obvious neuro-muscular anomalies detected. Sensory (Neurological): Unimpaired Palpation: No palpable anomalies              Upper Extremity (UE) Exam    Side: Right upper extremity  Side: Left upper extremity  Skin & Extremity Inspection: Skin color, temperature, and hair growth are WNL. No peripheral edema or cyanosis. No masses, redness, swelling, asymmetry, or associated skin lesions. No contractures.  Skin & Extremity Inspection: Skin color, temperature, and hair growth are WNL. No peripheral edema or cyanosis. No masses, redness, swelling, asymmetry, or associated skin lesions. No contractures.  Functional ROM: Unrestricted ROM          Functional ROM: Unrestricted ROM          Muscle Tone/Strength: Functionally intact. No obvious neuro-muscular anomalies detected.  Muscle Tone/Strength: Functionally intact. No obvious neuro-muscular anomalies detected.  Sensory (Neurological): Unimpaired          Sensory (Neurological): Unimpaired          Palpation: No palpable anomalies              Palpation: No palpable anomalies              Provocative Test(s):  Phalen's test: deferred Tinel's test: deferred Apley's scratch test (touch opposite shoulder):  Action 1 (Across chest): deferred Action 2 (Overhead): deferred Action 3 (LB reach): deferred   Provocative Test(s):  Phalen's test: deferred Tinel's test: deferred Apley's scratch test (touch opposite shoulder):  Action 1 (Across chest): deferred Action 2 (Overhead): deferred Action 3 (LB reach): deferred    Thoracic Spine Area Exam  Skin & Axial Inspection: No masses, redness, or swelling Alignment: Symmetrical Functional ROM: Unrestricted ROM Stability: No instability detected Muscle  Tone/Strength: Functionally intact. No obvious neuro-muscular anomalies detected. Sensory (Neurological): Unimpaired Muscle strength & Tone: No palpable anomalies  Lumbar Spine Area Exam  Skin & Axial Inspection: No masses, redness, or swelling Alignment: Symmetrical Functional ROM: Decreased ROM affecting primarily the left Stability: No instability detected Muscle Tone/Strength: Functionally intact. No obvious neuro-muscular anomalies detected. Sensory (Neurological): Dermatomal pain pattern Palpation: No palpable anomalies       Provocative Tests: Lumbar Hyperextension/rotation test: (+) due to pain. Lumbar quadrant test (Kemp's test): deferred today       Lumbar Lateral bending test: (+) ipsilateral radicular pain, on the left. Positive for left-sided foraminal stenosis. Patrick's Maneuver: deferred today  FABER test: deferred today                   Thigh-thrust test: deferred today       S-I compression test: deferred today       S-I distraction test: deferred today        Gait & Posture Assessment  Ambulation: Unassisted Gait: Relatively normal for age and body habitus Posture: WNL   Lower Extremity Exam    Side: Right lower extremity  Side: Left lower extremity  Stability: No instability observed          Stability: No instability observed          Skin & Extremity Inspection: Skin color, temperature, and hair growth are WNL. No peripheral edema or cyanosis. No masses, redness, swelling, asymmetry, or associated skin lesions. No contractures.  Skin & Extremity Inspection: Skin color, temperature, and hair growth are WNL. No peripheral edema or cyanosis. No masses, redness, swelling, asymmetry, or associated skin lesions. No contractures.  Functional ROM: Unrestricted ROM                  Functional ROM: Unrestricted ROM                  Muscle Tone/Strength: Functionally intact. No obvious neuro-muscular anomalies detected.  Muscle Tone/Strength:  Functionally intact. No obvious neuro-muscular anomalies detected.  Sensory (Neurological): Unimpaired  Sensory (Neurological): Unimpaired  Palpation: No palpable anomalies  Palpation: No palpable anomalies   Assessment  Primary Diagnosis & Pertinent Problem List: The primary encounter diagnosis was Lumbar radiculopathy. Diagnoses of Hx of lumbosacral spine surgery (Left L5/S1 foraminotomy), Spinal stenosis of lumbar region without neurogenic claudication, Herpes zoster without complication, and Chronic pain syndrome were also pertinent to this visit.  Visit Diagnosis (New problems to examiner): 1. Lumbar radiculopathy   2. Hx of lumbosacral spine surgery (Left L5/S1 foraminotomy)   3. Spinal stenosis of lumbar region without neurogenic claudication   4. Herpes zoster without complication   5. Chronic pain syndrome   General Recommendations: The pain condition that the patient suffers from is best treated with a multidisciplinary approach that involves an increase in physical activity to prevent de-conditioning and worsening of the pain cycle, as well as psychological counseling (formal and/or informal) to address the co-morbid psychological affects of pain. Treatment will often involve judicious use of pain medications and interventional procedures to decrease the pain, allowing the patient to participate in the physical activity that will ultimately produce long-lasting pain reductions. The goal of the multidisciplinary approach is to return the patient to a higher level of overall function and to restore their ability to perform activities of daily living.  67 year old female with a history of left hip and left leg pain that starts primarily at her left distal thigh and extends down to her left ankle in a dermatomal fashion.  Patient is status post greater trochanteric bursa injections on 05/13/2017 with Dr. Candelaria Stagers which were not effective for her hip pain.  Patient does have a history of  shingles last year.  She has had 2 previous lumbar spine surgeries including history of L5-S1 foraminotomy as well as left L5-S1 laminectomy.  Patient is referred here from Dr. Candelaria Stagers for consideration of lumbar epidural steroid injection and possible spinal cord stimulation for her radicular pain.   It is interesting that the patient's neuropathic pain seems localized between her mid thigh to her lower ankle.  Her MRI is concerning for possible L4 and L5 radiculopathy,  left greater than right..  Risks and benefits of lumbar epidural steroid injection were discussed.  Patient would like to proceed.  In regards to medication management, instructed patient to continue seeing her primary care physician for that.  We will focus on interventional therapies.  Plan: -Plan for left L3/4 or L4/5 ESI (patient does have a history of left L5-S1 laminectomy, will avoid this level given distorted lumbar anatomy possibly) -Possible spinal cord stimulation in the future. -Continue medication management with PCP   Ordered Lab-work, Procedure(s), Referral(s), & Consult(s): Orders Placed This Encounter  Procedures  . Lumbar Epidural Injection     Interventional management options: Ms. Laperle was informed that there is no guarantee that she would be a candidate for interventional therapies. The decision will be based on the results of diagnostic studies, as well as Ms. Frady's risk profile.  Procedure(s) under consideration:  Lumbar epidural steroid injection Lumbar facet medial branch nerve blocks with possible lumbar radio frequency ablation Thoracolumbar spinal cord stimulation   Provider-requested follow-up: Return in about 2 weeks (around 08/10/2017) for Procedure.  No future appointments.  Primary Care Physician: Wardell Honour, MD Location: [facility] Note by: Gillis Santa, M.D, Date: 07/27/2017; Time: 4:03 PM  Patient Instructions     GENERAL RISKS AND  COMPLICATIONS  What are the risk, side effects and possible complications? Generally speaking, most procedures are safe.  However, with any procedure there are risks, side effects, and the possibility of complications.  The risks and complications are dependent upon the sites that are lesioned, or the type of nerve block to be performed.  The closer the procedure is to the spine, the more serious the risks are.  Great care is taken when placing the radio frequency needles, block needles or lesioning probes, but sometimes complications can occur. 1. Infection: Any time there is an injection through the skin, there is a risk of infection.  This is why sterile conditions are used for these blocks.  There are four possible types of infection. 1. Localized skin infection. 2. Central Nervous System Infection-This can be in the form of Meningitis, which can be deadly. 3. Epidural Infections-This can be in the form of an epidural abscess, which can cause pressure inside of the spine, causing compression of the spinal cord with subsequent paralysis. This would require an emergency surgery to decompress, and there are no guarantees that the patient would recover from the paralysis. 4. Discitis-This is an infection of the intervertebral discs.  It occurs in about 1% of discography procedures.  It is difficult to treat and it may lead to surgery.        2. Pain: the needles have to go through skin and soft tissues, will cause soreness.       3. Damage to internal structures:  The nerves to be lesioned may be near blood vessels or    other nerves which can be potentially damaged.       4. Bleeding: Bleeding is more common if the patient is taking blood thinners such as  aspirin, Coumadin, Ticiid, Plavix, etc., or if he/she have some genetic predisposition  such as hemophilia. Bleeding into the spinal canal can cause compression of the spinal  cord with subsequent paralysis.  This would require an emergency surgery  to  decompress and there are no guarantees that the patient would recover from the  paralysis.       5. Pneumothorax:  Puncturing of a lung is a possibility, every  time a needle is introduced in  the area of the chest or upper back.  Pneumothorax refers to free air around the  collapsed lung(s), inside of the thoracic cavity (chest cavity).  Another two possible  complications related to a similar event would include: Hemothorax and Chylothorax.   These are variations of the Pneumothorax, where instead of air around the collapsed  lung(s), you may have blood or chyle, respectively.       6. Spinal headaches: They may occur with any procedures in the area of the spine.       7. Persistent CSF (Cerebro-Spinal Fluid) leakage: This is a rare problem, but may occur  with prolonged intrathecal or epidural catheters either due to the formation of a fistulous  track or a dural tear.       8. Nerve damage: By working so close to the spinal cord, there is always a possibility of  nerve damage, which could be as serious as a permanent spinal cord injury with  paralysis.       9. Death:  Although rare, severe deadly allergic reactions known as "Anaphylactic  reaction" can occur to any of the medications used.      10. Worsening of the symptoms:  We can always make thing worse.  What are the chances of something like this happening? Chances of any of this occuring are extremely low.  By statistics, you have more of a chance of getting killed in a motor vehicle accident: while driving to the hospital than any of the above occurring .  Nevertheless, you should be aware that they are possibilities.  In general, it is similar to taking a shower.  Everybody knows that you can slip, hit your head and get killed.  Does that mean that you should not shower again?  Nevertheless always keep in mind that statistics do not mean anything if you happen to be on the wrong side of them.  Even if a procedure has a 1 (one) in a 1,000,000  (million) chance of going wrong, it you happen to be that one..Also, keep in mind that by statistics, you have more of a chance of having something go wrong when taking medications.  Who should not have this procedure? If you are on a blood thinning medication (e.g. Coumadin, Plavix, see list of "Blood Thinners"), or if you have an active infection going on, you should not have the procedure.  If you are taking any blood thinners, please inform your physician.  How should I prepare for this procedure?  Do not eat or drink anything at least six hours prior to the procedure.  Bring a driver with you .  It cannot be a taxi.  Come accompanied by an adult that can drive you back, and that is strong enough to help you if your legs get weak or numb from the local anesthetic.  Take all of your medicines the morning of the procedure with just enough water to swallow them.  If you have diabetes, make sure that you are scheduled to have your procedure done first thing in the morning, whenever possible.  If you have diabetes, take only half of your insulin dose and notify our nurse that you have done so as soon as you arrive at the clinic.  If you are diabetic, but only take blood sugar pills (oral hypoglycemic), then do not take them on the morning of your procedure.  You may take them after you have had the procedure.  Do not take aspirin or any aspirin-containing medications, at least eleven (11) days prior to the procedure.  They may prolong bleeding.  Wear loose fitting clothing that may be easy to take off and that you would not mind if it got stained with Betadine or blood.  Do not wear any jewelry or perfume  Remove any nail coloring.  It will interfere with some of our monitoring equipment.  NOTE: Remember that this is not meant to be interpreted as a complete list of all possible complications.  Unforeseen problems may occur.  BLOOD THINNERS The following drugs contain aspirin or other  products, which can cause increased bleeding during surgery and should not be taken for 2 weeks prior to and 1 week after surgery.  If you should need take something for relief of minor pain, you may take acetaminophen which is found in Tylenol,m Datril, Anacin-3 and Panadol. It is not blood thinner. The products listed below are.  Do not take any of the products listed below in addition to any listed on your instruction sheet.  A.P.C or A.P.C with Codeine Codeine Phosphate Capsules #3 Ibuprofen Ridaura  ABC compound Congesprin Imuran rimadil  Advil Cope Indocin Robaxisal  Alka-Seltzer Effervescent Pain Reliever and Antacid Coricidin or Coricidin-D  Indomethacin Rufen  Alka-Seltzer plus Cold Medicine Cosprin Ketoprofen S-A-C Tablets  Anacin Analgesic Tablets or Capsules Coumadin Korlgesic Salflex  Anacin Extra Strength Analgesic tablets or capsules CP-2 Tablets Lanoril Salicylate  Anaprox Cuprimine Capsules Levenox Salocol  Anexsia-D Dalteparin Magan Salsalate  Anodynos Darvon compound Magnesium Salicylate Sine-off  Ansaid Dasin Capsules Magsal Sodium Salicylate  Anturane Depen Capsules Marnal Soma  APF Arthritis pain formula Dewitt's Pills Measurin Stanback  Argesic Dia-Gesic Meclofenamic Sulfinpyrazone  Arthritis Bayer Timed Release Aspirin Diclofenac Meclomen Sulindac  Arthritis pain formula Anacin Dicumarol Medipren Supac  Analgesic (Safety coated) Arthralgen Diffunasal Mefanamic Suprofen  Arthritis Strength Bufferin Dihydrocodeine Mepro Compound Suprol  Arthropan liquid Dopirydamole Methcarbomol with Aspirin Synalgos  ASA tablets/Enseals Disalcid Micrainin Tagament  Ascriptin Doan's Midol Talwin  Ascriptin A/D Dolene Mobidin Tanderil  Ascriptin Extra Strength Dolobid Moblgesic Ticlid  Ascriptin with Codeine Doloprin or Doloprin with Codeine Momentum Tolectin  Asperbuf Duoprin Mono-gesic Trendar  Aspergum Duradyne Motrin or Motrin IB Triminicin  Aspirin plain, buffered or enteric  coated Durasal Myochrisine Trigesic  Aspirin Suppositories Easprin Nalfon Trillsate  Aspirin with Codeine Ecotrin Regular or Extra Strength Naprosyn Uracel  Atromid-S Efficin Naproxen Ursinus  Auranofin Capsules Elmiron Neocylate Vanquish  Axotal Emagrin Norgesic Verin  Azathioprine Empirin or Empirin with Codeine Normiflo Vitamin E  Azolid Emprazil Nuprin Voltaren  Bayer Aspirin plain, buffered or children's or timed BC Tablets or powders Encaprin Orgaran Warfarin Sodium  Buff-a-Comp Enoxaparin Orudis Zorpin  Buff-a-Comp with Codeine Equegesic Os-Cal-Gesic   Buffaprin Excedrin plain, buffered or Extra Strength Oxalid   Bufferin Arthritis Strength Feldene Oxphenbutazone   Bufferin plain or Extra Strength Feldene Capsules Oxycodone with Aspirin   Bufferin with Codeine Fenoprofen Fenoprofen Pabalate or Pabalate-SF   Buffets II Flogesic Panagesic   Buffinol plain or Extra Strength Florinal or Florinal with Codeine Panwarfarin   Buf-Tabs Flurbiprofen Penicillamine   Butalbital Compound Four-way cold tablets Penicillin   Butazolidin Fragmin Pepto-Bismol   Carbenicillin Geminisyn Percodan   Carna Arthritis Reliever Geopen Persantine   Carprofen Gold's salt Persistin   Chloramphenicol Goody's Phenylbutazone   Chloromycetin Haltrain Piroxlcam   Clmetidine heparin Plaquenil   Cllnoril Hyco-pap Ponstel   Clofibrate Hydroxy chloroquine Propoxyphen         Before stopping  any of these medications, be sure to consult the physician who ordered them.  Some, such as Coumadin (Warfarin) are ordered to prevent or treat serious conditions such as "deep thrombosis", "pumonary embolisms", and other heart problems.  The amount of time that you may need off of the medication may also vary with the medication and the reason for which you were taking it.  If you are taking any of these medications, please make sure you notify your pain physician before you undergo any procedures.         Epidural  Steroid Injection Patient Information  Description: The epidural space surrounds the nerves as they exit the spinal cord.  In some patients, the nerves can be compressed and inflamed by a bulging disc or a tight spinal canal (spinal stenosis).  By injecting steroids into the epidural space, we can bring irritated nerves into direct contact with a potentially helpful medication.  These steroids act directly on the irritated nerves and can reduce swelling and inflammation which often leads to decreased pain.  Epidural steroids may be injected anywhere along the spine and from the neck to the low back depending upon the location of your pain.   After numbing the skin with local anesthetic (like Novocaine), a small needle is passed into the epidural space slowly.  You may experience a sensation of pressure while this is being done.  The entire block usually last less than 10 minutes.  Conditions which may be treated by epidural steroids:   Low back and leg pain  Neck and arm pain  Spinal stenosis  Post-laminectomy syndrome  Herpes zoster (shingles) pain  Pain from compression fractures  Preparation for the injection:  1. Do not eat any solid food or dairy products within 8 hours of your appointment.  2. You may drink clear liquids up to 3 hours before appointment.  Clear liquids include water, black coffee, juice or soda.  No milk or cream please. 3. You may take your regular medication, including pain medications, with a sip of water before your appointment  Diabetics should hold regular insulin (if taken separately) and take 1/2 normal NPH dos the morning of the procedure.  Carry some sugar containing items with you to your appointment. 4. A driver must accompany you and be prepared to drive you home after your procedure.  5. Bring all your current medications with your. 6. An IV may be inserted and sedation may be given at the discretion of the physician.   7. A blood pressure cuff, EKG  and other monitors will often be applied during the procedure.  Some patients may need to have extra oxygen administered for a short period. 8. You will be asked to provide medical information, including your allergies, prior to the procedure.  We must know immediately if you are taking blood thinners (like Coumadin/Warfarin)  Or if you are allergic to IV iodine contrast (dye). We must know if you could possible be pregnant.  Possible side-effects:  Bleeding from needle site  Infection (rare, may require surgery)  Nerve injury (rare)  Numbness & tingling (temporary)  Difficulty urinating (rare, temporary)  Spinal headache ( a headache worse with upright posture)  Light -headedness (temporary)  Pain at injection site (several days)  Decreased blood pressure (temporary)  Weakness in arm/leg (temporary)  Pressure sensation in back/neck (temporary)  Call if you experience:  Fever/chills associated with headache or increased back/neck pain.  Headache worsened by an upright position.  New onset weakness or  numbness of an extremity below the injection site  Hives or difficulty breathing (go to the emergency room)  Inflammation or drainage at the infection site  Severe back/neck pain  Any new symptoms which are concerning to you  Please note:  Although the local anesthetic injected can often make your back or neck feel good for several hours after the injection, the pain will likely return.  It takes 3-7 days for steroids to work in the epidural space.  You may not notice any pain relief for at least that one week.  If effective, we will often do a series of three injections spaced 3-6 weeks apart to maximally decrease your pain.  After the initial series, we generally will wait several months before considering a repeat injection of the same type.  If you have any questions, please call [phone] [facility]

## 2017-08-15 ENCOUNTER — Encounter: Payer: Self-pay | Admitting: Student in an Organized Health Care Education/Training Program

## 2017-08-15 ENCOUNTER — Ambulatory Visit (HOSPITAL_BASED_OUTPATIENT_CLINIC_OR_DEPARTMENT_OTHER): Payer: 59 | Admitting: Student in an Organized Health Care Education/Training Program

## 2017-08-15 ENCOUNTER — Ambulatory Visit
Admission: RE | Admit: 2017-08-15 | Discharge: 2017-08-15 | Disposition: A | Discharge status: Home / Self Care | Payer: 59 | Source: Ambulatory Visit | Attending: Student in an Organized Health Care Education/Training Program | Admitting: Student in an Organized Health Care Education/Training Program

## 2017-08-15 DIAGNOSIS — Q282 Arteriovenous malformation of cerebral vessels: Secondary | ICD-10-CM | POA: Diagnosis not present

## 2017-08-15 DIAGNOSIS — Z79899 Other long term (current) drug therapy: Secondary | ICD-10-CM | POA: Diagnosis not present

## 2017-08-15 DIAGNOSIS — M5416 Radiculopathy, lumbar region: Secondary | ICD-10-CM | POA: Diagnosis not present

## 2017-08-15 DIAGNOSIS — Z888 Allergy status to other drugs, medicaments and biological substances status: Secondary | ICD-10-CM | POA: Insufficient documentation

## 2017-08-15 DIAGNOSIS — Z9889 Other specified postprocedural states: Secondary | ICD-10-CM | POA: Diagnosis not present

## 2017-08-15 MED ORDER — ROPIVACAINE HCL 2 MG/ML IJ SOLN
INTRAMUSCULAR | Status: AC
  Filled 2017-08-15: qty 10

## 2017-08-15 MED ORDER — FENTANYL CITRATE (PF) 100 MCG/2ML IJ SOLN
INTRAMUSCULAR | Status: AC
  Filled 2017-08-15: qty 2

## 2017-08-15 MED ORDER — DEXAMETHASONE SODIUM PHOSPHATE 10 MG/ML IJ SOLN
INTRAMUSCULAR | Status: AC
  Filled 2017-08-15: qty 1

## 2017-08-15 MED ORDER — SODIUM CHLORIDE 0.9 % IJ SOLN
INTRAMUSCULAR | Status: AC
  Filled 2017-08-15: qty 10

## 2017-08-15 MED ORDER — FENTANYL CITRATE (PF) 100 MCG/2ML IJ SOLN
25.0000 ug | INTRAMUSCULAR | Status: DC | PRN
  Administered 2017-08-15: 25 ug via INTRAVENOUS

## 2017-08-15 MED ORDER — DEXAMETHASONE SODIUM PHOSPHATE 10 MG/ML IJ SOLN
10.0000 mg | Freq: Once | INTRAMUSCULAR | Status: AC
  Administered 2017-08-15: 10 mg

## 2017-08-15 MED ORDER — LIDOCAINE HCL (PF) 1 % IJ SOLN
INTRAMUSCULAR | Status: AC
  Filled 2017-08-15: qty 5

## 2017-08-15 MED ORDER — SODIUM CHLORIDE 0.9% FLUSH
2.0000 mL | Freq: Once | INTRAVENOUS | Status: AC
  Administered 2017-08-15: 10 mL

## 2017-08-15 MED ORDER — IOPAMIDOL (ISOVUE-M 200) INJECTION 41%
10.0000 mL | Freq: Once | INTRAMUSCULAR | Status: AC
  Administered 2017-08-15: 10 mL via EPIDURAL

## 2017-08-15 MED ORDER — LIDOCAINE HCL 2 % IJ SOLN: 10.0000 mL | Freq: Once | INTRAMUSCULAR | Status: DC

## 2017-08-15 MED ORDER — IOPAMIDOL (ISOVUE-M 200) INJECTION 41%
INTRAMUSCULAR | Status: AC
Start: 2017-08-15 — End: ?
  Filled 2017-08-15: qty 10

## 2017-08-15 MED ORDER — ROPIVACAINE HCL 2 MG/ML IJ SOLN
2.0000 mL | Freq: Once | INTRAMUSCULAR | Status: AC
  Administered 2017-08-15: 2 mL via EPIDURAL

## 2017-08-15 MED ORDER — LACTATED RINGERS IV SOLN
1000.0000 mL | Freq: Once | INTRAVENOUS | Status: AC
  Administered 2017-08-15: 1000 mL via INTRAVENOUS

## 2017-08-15 NOTE — Progress Notes (Signed)
Patient's Name: Maureen Hodge  MRN: [id number]  Referring Provider: Gillis Santa, MD  DOB: April 12, 1950  PCP: Wardell Honour, MD  DOS: 08/15/2017  Note by: Gillis Santa, MD  Service setting: Ambulatory outpatient  Specialty: Interventional Pain Management  Patient type: Established  Location: [facility] (AMB) Pain Management Facility  Visit type: Interventional Procedure   Primary Reason for Visit: Interventional Pain Management Treatment. CC: Back Pain (lower worse on the left )  Procedure:          Anesthesia, Analgesia, Anxiolysis:  Type: Therapeutic Inter-Laminar Epidural Steroid Injection #1  Region: Lumbar Level: L3-4 Level. Laterality: Left-Sided         Type: Moderate (Conscious) Sedation combined with Local Anesthesia Indication(s): Analgesia and Anxiety Route: Intravenous (IV) IV Access: Secured Sedation: Meaningful verbal contact was maintained at all times during the procedure  Local Anesthetic: Lidocaine 1-2%   Indications: 1. Lumbar radiculopathy    Pain Score: Pre-procedure: 10-Worst pain ever/10 Post-procedure: 0-No pain/10  Pre-op Assessment:  Maureen Hodge is a 67 y.o. (year old), female patient, seen today for interventional treatment. She  has a past surgical history that includes Dilation and curettage of uterus; Hemorrhoid surgery; Breast surgery; Foot surgery; Brain surgery (2013); lumbar surgery (2000); Brain AVM surgery; Tonsillectomy and adenoidectomy; Spine surgery; Breast cyst aspiration (Bilateral); Hernia repair; Total abdominal hysterectomy (1979); Colonoscopy with propofol (N/A, 07/12/2016); polypectomy (07/12/2016); and Breast biopsy (Bilateral). Maureen Hodge has a current medication list which includes the following prescription(s): acetaminophen, atenolol, cetirizine, ciclopirox, clobetasol cream, furosemide, gabapentin, hydroxychloroquine, hydroxyzine, lidocaine, methotrexate, mometasone, nitroglycerin, nortriptyline, pantoprazole, potassium chloride sa, prednisone,  tramadol, and triamterene-hydrochlorothiazide, and the following Facility-Administered Medications: fentanyl and lidocaine. Her primarily concern today is the Back Pain (lower worse on the left )  Initial Vital Signs:  Pulse/HCG Rate: 60ECG Heart Rate: 61 Temp: 97.8 F (36.6 C) Resp: 16 BP: 129/65 SpO2: 100 %  BMI: Estimated body mass index is 34.96 kg/m as calculated from the following:   Height as of this encounter: 5\' 1"  (1.549 m).   Weight as of this encounter: 185 lb (83.9 kg).  Risk Assessment: Allergies: Reviewed. She is allergic to lisinopril.  Allergy Precautions: None required Coagulopathies: Reviewed. None identified.  Blood-thinner therapy: None at this time Active Infection(s): Reviewed. None identified. Maureen Hodge is afebrile  Site Confirmation: Maureen Hodge was asked to confirm the procedure and laterality before marking the site Procedure checklist: Completed Consent: Before the procedure and under the influence of no sedative(s), amnesic(s), or anxiolytics, the patient was informed of the treatment options, risks and possible complications. To fulfill our ethical and legal obligations, as recommended by the American Medical Association's Code of Ethics, I have informed the patient of my clinical impression; the nature and purpose of the treatment or procedure; the risks, benefits, and possible complications of the intervention; the alternatives, including doing nothing; the risk(s) and benefit(s) of the alternative treatment(s) or procedure(s); and the risk(s) and benefit(s) of doing nothing. The patient was provided information about the general risks and possible complications associated with the procedure. These may include, but are not limited to: failure to achieve desired goals, infection, bleeding, organ or nerve damage, allergic reactions, paralysis, and death. In addition, the patient was informed of those risks and complications associated to Spine-related  procedures, such as failure to decrease pain; infection (i.e.: Meningitis, epidural or intraspinal abscess); bleeding (i.e.: epidural hematoma, subarachnoid hemorrhage, or any other type of intraspinal or peri-dural bleeding); organ or nerve damage (i.e.: Any type of peripheral  nerve, nerve root, or spinal cord injury) with subsequent damage to sensory, motor, and/or autonomic systems, resulting in permanent pain, numbness, and/or weakness of one or several areas of the body; allergic reactions; (i.e.: anaphylactic reaction); and/or death. Furthermore, the patient was informed of those risks and complications associated with the medications. These include, but are not limited to: allergic reactions (i.e.: anaphylactic or anaphylactoid reaction(s)); adrenal axis suppression; blood sugar elevation that in diabetics may result in ketoacidosis or comma; water retention that in patients with history of congestive heart failure may result in shortness of breath, pulmonary edema, and decompensation with resultant heart failure; weight gain; swelling or edema; medication-induced neural toxicity; particulate matter embolism and blood vessel occlusion with resultant organ, and/or nervous system infarction; and/or aseptic necrosis of one or more joints. Finally, the patient was informed that Medicine is not an exact science; therefore, there is also the possibility of unforeseen or unpredictable risks and/or possible complications that may result in a catastrophic outcome. The patient indicated having understood very clearly. We have given the patient no guarantees and we have made no promises. Enough time was given to the patient to ask questions, all of which were answered to the patient's satisfaction. Ms. Maureen Hodge has indicated that she wanted to continue with the procedure. Attestation: I, the ordering provider, attest that I have discussed with the patient the benefits, risks, side-effects, alternatives, likelihood of  achieving goals, and potential problems during recovery for the procedure that I have provided informed consent. Date  Time: 08/15/2017  8:19 AM  Pre-Procedure Preparation:  Monitoring: As per clinic protocol. Respiration, ETCO2, SpO2, BP, heart rate and rhythm monitor placed and checked for adequate function Safety Precautions: Patient was assessed for positional comfort and pressure points before starting the procedure. Time-out: I initiated and conducted the "Time-out" before starting the procedure, as per protocol. The patient was asked to participate by confirming the accuracy of the "Time Out" information. Verification of the correct person, site, and procedure were performed and confirmed by me, the nursing staff, and the patient. "Time-out" conducted as per Joint Commission's Universal Protocol (UP.01.01.01). Time: 0932  Description of Procedure:          Position: Prone with head of the table was raised to facilitate breathing. Target Area: The interlaminar space, initially targeting the lower laminar border of the superior vertebral body. Approach: Paramedial approach. Area Prepped: Entire Posterior Lumbar Region Prepping solution: ChloraPrep (2% chlorhexidine gluconate and 70% isopropyl alcohol) Safety Precautions: Aspiration looking for blood return was conducted prior to all injections. At no point did we inject any substances, as a needle was being advanced. No attempts were made at seeking any paresthesias. Safe injection practices and needle disposal techniques used. Medications properly checked for expiration dates. SDV (single dose vial) medications used. Description of the Procedure: Protocol guidelines were followed. The procedure needle was introduced through the skin, ipsilateral to the reported pain, and advanced to the target area. Bone was contacted and the needle walked caudad, until the lamina was cleared. The epidural space was identified using "loss-of-resistance  technique" with 2-3 ml of PF-NaCl (0.9% NSS), in a 5cc LOR glass syringe. Vitals:   08/15/17 0938 08/15/17 0947 08/15/17 0958 08/15/17 1007  BP: (!) 121/91 117/73 118/78 108/78  Pulse:      Resp: 16 18 16 16   Temp:  98.7 F (37.1 C)  98.6 F (37 C)  TempSrc:      SpO2: 96% 97% 98% 98%  Weight:  Height:        Start Time: 0932 hrs. End Time: 0937 hrs. Materials:  Needle(s) Type: Epidural needle Gauge: 17G Length: 3.5-in Medication(s): Please see orders for medications and dosing details. 9 cc solution consisting of 6 cc of preservative-free saline, 1 cc of Decadron 10 mg/cc, 2 cc of 0.2% ropivacaine Imaging Guidance (Spinal):          Type of Imaging Technique: Fluoroscopy Guidance (Spinal) Indication(s): Assistance in needle guidance and placement for procedures requiring needle placement in or near specific anatomical locations not easily accessible without such assistance. Exposure Time: Please see nurses notes. Contrast: Before injecting any contrast, we confirmed that the patient did not have an allergy to iodine, shellfish, or radiological contrast. Once satisfactory needle placement was completed at the desired level, radiological contrast was injected. Contrast injected under live fluoroscopy. No contrast complications. See chart for type and volume of contrast used. Fluoroscopic Guidance: I was personally present during the use of fluoroscopy. "Tunnel Vision Technique" used to obtain the best possible view of the target area. Parallax error corrected before commencing the procedure. "Direction-depth-direction" technique used to introduce the needle under continuous pulsed fluoroscopy. Once target was reached, antero-posterior, oblique, and lateral fluoroscopic projection used confirm needle placement in all planes. Images permanently stored in EMR. Interpretation: I personally interpreted the imaging intraoperatively. Adequate needle placement confirmed in multiple planes.  Appropriate spread of contrast into desired area was observed. No evidence of afferent or efferent intravascular uptake. No intrathecal or subarachnoid spread observed. Permanent images saved into the patient's record.  Antibiotic Prophylaxis:   Anti-infectives (From admission, onward)   None     Indication(s): None identified  Post-operative Assessment:  Post-procedure Vital Signs:  Pulse/HCG Rate: 60(!) 58 Temp: 98.6 F (37 C) Resp: 16 BP: 108/78 SpO2: 98 %  EBL: None  Complications: No immediate post-treatment complications observed by team, or reported by patient.  Note: The patient tolerated the entire procedure well. A repeat set of vitals were taken after the procedure and the patient was kept under observation following institutional policy, for this type of procedure. Post-procedural neurological assessment was performed, showing return to baseline, prior to discharge. The patient was provided with post-procedure discharge instructions, including a section on how to identify potential problems. Should any problems arise concerning this procedure, the patient was given instructions to immediately contact us, at any time, without hesitation. In any case, we plan to contact the patient by telephone for a follow-up status report regarding this interventional procedure.  Comments:  No additional relevant information. 5 out of 5 strength bilateral lower extremity: Plantar flexion, dorsiflexion, knee flexion, knee extension.  Plan of Care    Imaging Orders     DG C-Arm 1-60 Min-No Report Procedure Orders    No procedure(s) ordered today    Medications ordered for procedure: Meds ordered this encounter  Medications  . lactated ringers infusion 1,000 mL  . fentaNYL (SUBLIMAZE) injection 25-100 mcg    Make sure Narcan is available in the pyxis when using this medication. In the event of respiratory depression (RR< 8/min): Titrate NARCAN (naloxone) in increments of 0.1 to 0.2  mg IV at 2-3 minute intervals, until desired degree of reversal.  . iopamidol (ISOVUE-M) 41 % intrathecal injection 10 mL  . ropivacaine (PF) 2 mg/mL (0.2%) (NAROPIN) injection 2 mL  . sodium chloride flush (NS) 0.9 % injection 2 mL  . lidocaine (XYLOCAINE) 2 % (with pres) injection 200 mg  . dexamethasone (DECADRON) injection 10 mg  Medications administered: We administered lactated ringers, fentaNYL, iopamidol, ropivacaine (PF) 2 mg/mL (0.2%), sodium chloride flush, and dexamethasone.  See the medical record for exact dosing, route, and time of administration.  New Prescriptions   No medications on file   Disposition: Discharge home  Discharge Date & Time: 08/15/2017; 1008 hrs.   Physician-requested Follow-up: Return in about 4 weeks (around 09/12/2017) for Post Procedure Evaluation.  Future Appointments  Date Time Provider Barrett  09/12/2017 12:30 PM Gillis Santa, MD [facility] None   Primary Care Physician: Wardell Honour, MD Location: [facility] Note by: Gillis Santa, MD Date: 08/15/2017; Time: 10:39 AM  Disclaimer:  Medicine is not an exact science. The only guarantee in medicine is that nothing is guaranteed. It is important to note that the decision to proceed with this intervention was based on the information collected from the patient. The Data and conclusions were drawn from the patient's questionnaire, the interview, and the physical examination. Because the information was provided in large part by the patient, it cannot be guaranteed that it has not been purposely or unconsciously manipulated. Every effort has been made to obtain as much relevant data as possible for this evaluation. It is important to note that the conclusions that lead to this procedure are derived in large part from the available data. Always take into account that the treatment will also be dependent on availability of resources and existing treatment guidelines,  considered by other Pain Management Practitioners as being common knowledge and practice, at the time of the intervention. For Medico-Legal purposes, it is also important to point out that variation in procedural techniques and pharmacological choices are the acceptable norm. The indications, contraindications, technique, and results of the above procedure should only be interpreted and judged by a Board-Certified Interventional Pain Specialist with extensive familiarity and expertise in the same exact procedure and technique.

## 2017-08-15 NOTE — Progress Notes (Signed)
Safety precautions to be maintained throughout the outpatient stay will include: orient to surroundings, keep bed in low position, maintain call bell within reach at all times, provide assistance with transfer out of bed and ambulation.  

## 2017-08-16 ENCOUNTER — Telehealth: Payer: Self-pay

## 2017-08-16 NOTE — Telephone Encounter (Signed)
Post procedure phone call.  Patient states she is doing well.  

## 2017-09-12 ENCOUNTER — Other Ambulatory Visit: Payer: Self-pay

## 2017-09-12 ENCOUNTER — Ambulatory Visit
Payer: 59 | Attending: Student in an Organized Health Care Education/Training Program | Admitting: Student in an Organized Health Care Education/Training Program

## 2017-09-12 ENCOUNTER — Encounter: Payer: Self-pay | Admitting: Student in an Organized Health Care Education/Training Program

## 2017-09-12 VITALS — BP 141/91 | HR 62 | Temp 98.0°F | Resp 16 | Ht 61.0 in | Wt 188.0 lb

## 2017-09-12 DIAGNOSIS — M48061 Spinal stenosis, lumbar region without neurogenic claudication: Secondary | ICD-10-CM | POA: Diagnosis not present

## 2017-09-12 DIAGNOSIS — G4733 Obstructive sleep apnea (adult) (pediatric): Secondary | ICD-10-CM | POA: Insufficient documentation

## 2017-09-12 DIAGNOSIS — R600 Localized edema: Secondary | ICD-10-CM | POA: Diagnosis not present

## 2017-09-12 DIAGNOSIS — K219 Gastro-esophageal reflux disease without esophagitis: Secondary | ICD-10-CM | POA: Diagnosis not present

## 2017-09-12 DIAGNOSIS — E876 Hypokalemia: Secondary | ICD-10-CM | POA: Diagnosis not present

## 2017-09-12 DIAGNOSIS — E669 Obesity, unspecified: Secondary | ICD-10-CM | POA: Insufficient documentation

## 2017-09-12 DIAGNOSIS — M79605 Pain in left leg: Secondary | ICD-10-CM | POA: Diagnosis present

## 2017-09-12 DIAGNOSIS — I671 Cerebral aneurysm, nonruptured: Secondary | ICD-10-CM | POA: Diagnosis not present

## 2017-09-12 DIAGNOSIS — I10 Essential (primary) hypertension: Secondary | ICD-10-CM | POA: Diagnosis not present

## 2017-09-12 DIAGNOSIS — L930 Discoid lupus erythematosus: Secondary | ICD-10-CM | POA: Diagnosis not present

## 2017-09-12 DIAGNOSIS — M961 Postlaminectomy syndrome, not elsewhere classified: Secondary | ICD-10-CM | POA: Diagnosis not present

## 2017-09-12 DIAGNOSIS — Z9889 Other specified postprocedural states: Secondary | ICD-10-CM | POA: Diagnosis not present

## 2017-09-12 DIAGNOSIS — M5416 Radiculopathy, lumbar region: Secondary | ICD-10-CM | POA: Diagnosis not present

## 2017-09-12 DIAGNOSIS — E119 Type 2 diabetes mellitus without complications: Secondary | ICD-10-CM | POA: Diagnosis not present

## 2017-09-12 DIAGNOSIS — D329 Benign neoplasm of meninges, unspecified: Secondary | ICD-10-CM | POA: Insufficient documentation

## 2017-09-12 DIAGNOSIS — M542 Cervicalgia: Secondary | ICD-10-CM | POA: Diagnosis not present

## 2017-09-12 NOTE — Progress Notes (Signed)
Patient's Name: Maureen Hodge  MRN: [id number]  Referring Provider: Wardell Honour, MD  DOB: 02/14/1950  PCP: Wardell Honour, MD  DOS: 09/12/2017  Note by: Gillis Santa, MD  Service setting: Ambulatory outpatient  Specialty: Interventional Pain Management  Location: [facility] (AMB) Pain Management Facility    Patient type: Established   Primary Reason(s) for Visit: Encounter for post-procedure evaluation of chronic illness with mild to moderate exacerbation CC: Leg Pain (left, lower)  HPI  Maureen Hodge is a 67 y.o. year old, female patient, who comes today for a post-procedure evaluation. 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 Her primarily concern today is the Leg Pain (left, lower)  Pain Assessment: Location: Left, Lower Leg Radiating: denies Onset: More than a month ago Duration: Chronic pain Quality: (pulling) Severity: 6 /10 (subjective, self-reported pain score)  Note: Reported level is compatible with observation.                         When using our objective Pain Scale, levels between 6 and 10/10 are said to belong in an emergency room, as it progressively worsens from a 6/10, described as severely limiting, requiring emergency care not usually available at an outpatient pain management facility. At a 6/10 level, communication becomes difficult and requires great effort. Assistance to reach the emergency department may be required. Facial flushing and profuse sweating along with potentially dangerous increases in heart rate and blood pressure will be evident. Effect on ADL:   Timing:  Constant Modifying factors: medication BP: (!) 141/91  HR: 62  Maureen Hodge comes in today for post-procedure evaluation after the treatment done on 08/16/2017.  Further details on both, my assessment(s), as well as the proposed treatment plan, please see below.  Post-Procedure Assessment  08/15/2017 Procedure: Left L3-L4 ESI #1 Pre-procedure pain score:  10/10 Post-procedure pain score: 0/10         Influential Factors: BMI: 35.52 kg/m Intra-procedural challenges: None observed.         Assessment challenges: None detected.              Reported side-effects: None.        Post-procedural adverse reactions or complications: None reported         Sedation: Please see nurses note. When no sedatives are used, the analgesic levels obtained are directly associated to the effectiveness of the local anesthetics. However, when sedation is provided, the level of analgesia obtained during the initial 1 hour following the intervention, is believed to be the result of a combination of factors. These factors may include, but are not limited to: 1. The effectiveness of the local anesthetics used. 2. The effects of the analgesic(s) and/or anxiolytic(s) used. 3. The degree of discomfort experienced by the patient at the time of the procedure. 4. The patients ability and reliability in recalling and recording the events. 5. The presence and influence of possible secondary gains and/or psychosocial factors. Reported result: Relief experienced during the 1st hour after the procedure: 100 % (Ultra-Short Term Relief)            Interpretative annotation: Clinically appropriate result. Analgesia during this period is likely to  be Local Anesthetic and/or IV Sedative (Analgesic/Anxiolytic) related.          Effects of local anesthetic: The analgesic effects attained during this period are directly associated to the localized infiltration of local anesthetics and therefore cary significant diagnostic value as to  the etiological location, or anatomical origin, of the pain. Expected duration of relief is directly dependent on the pharmacodynamics of the local anesthetic used. Long-acting (4-6 hours) anesthetics used.  Reported result: Relief during the next 4 to 6 hour after the procedure: 100 % (Short-Term Relief)            Interpretative annotation: Clinically appropriate result. Analgesia during this period is likely to be Local Anesthetic-related.          Long-term benefit: Defined as the period of time past the expected duration of local anesthetics (1 hour for short-acting and 4-6 hours for long-acting). With the possible exception of prolonged sympathetic blockade from the local anesthetics, benefits during this period are typically attributed to, or associated with, other factors such as analgesic sensory neuropraxia, antiinflammatory effects, or beneficial biochemical changes provided by agents other than the local anesthetics.  Reported result: Extended relief following procedure: 40 % (Long-Term Relief)            Interpretative annotation: Clinically possible results. Good relief. No permanent benefit expected. Inflammation plays a part in the etiology to the pain.          Current benefits: Defined as reported results that persistent at this point in time.   Analgesia: 50 %            Function: Somewhat improved ROM: Somewhat improved Interpretative annotation: Recurrence of symptoms. No permanent benefit expected. Effective diagnostic intervention.          Interpretation: Results would suggest a successful diagnostic intervention.                  Plan:  Please see "Plan of Care" for details.                Laboratory Chemistry  Inflammation Markers (CRP: Acute Phase) (ESR: Chronic Phase) No results found for: CRP, ESRSEDRATE, LATICACIDVEN                       Rheumatology Markers No results found for: RF, ANA, LABURIC, URICUR, LYMEIGGIGMAB, LYMEABIGMQN, HLAB27                       Renal Function Markers Lab Results  Component Value Date   BUN 9 06/15/2017   CREATININE 0.87 06/15/2017   BCR 10 (L) 06/15/2017   GFRAA 81 06/15/2017   GFRNONAA 71 06/15/2017                             Hepatic Function Markers Lab Results  Component Value Date   AST 24 06/15/2017   ALT 14 06/15/2017   ALBUMIN 3.9 06/15/2017   ALKPHOS 89 06/15/2017   HCVAB NEGATIVE 11/15/2014   LIPASE 24 12/16/2016                        Electrolytes Lab Results  Component Value Date   NA 144 06/15/2017   K 3.3 (L) 06/15/2017   CL 100 06/15/2017   CALCIUM 9.3 06/15/2017   MG 1.5 11/05/2015  Neuropathy Markers Lab Results  Component Value Date   OVFIEPPI95 188 01/24/2012   HGBA1C 6.2 (H) 02/09/2017   HIV NONREACTIVE 11/15/2014                        CNS Tests Lab Results  Component Value Date   GRAMSTAIN Rare 08/14/2015   GRAMSTAIN WBC present-predominately PMN 08/14/2015   GRAMSTAIN No Squamous Epithelial Cells Seen 08/14/2015   GRAMSTAIN Rare GRAM POSITIVE COCCI IN PAIRS 08/14/2015                        Bone Pathology Markers Lab Results  Component Value Date   VD25OH 43 01/24/2012                         Coagulation Parameters Lab Results  Component Value Date   PLT 268 06/15/2017                        Cardiovascular Markers Lab Results  Component Value Date   BNP 15.4 07/22/2015   TROPONINI <0.03 12/16/2016   HGB 12.3 06/15/2017   HCT 36.4 06/15/2017                         CA Markers No results found for: CEA, CA125, LABCA2                      Note: Lab results reviewed.  Recent Diagnostic Imaging Results  DG C-Arm 1-60 Min-No Report Fluoroscopy was utilized by the requesting physician.  No radiographic  interpretation.   Complexity Note: Imaging results reviewed. Results shared with Ms. Pagliuca, using Layman's terms.                         Meds   Current Outpatient Medications:  .  acetaminophen (TYLENOL) 500 MG  tablet, Take 1,000 mg by mouth every 8 (eight) hours as needed., Disp: , Rfl:  .  atenolol (TENORMIN) 50 MG tablet, Take 1 tablet (50 mg total) by mouth 2 (two) times daily., Disp: 180 tablet, Rfl: 3 .  cetirizine (ZYRTEC) 10 MG tablet, TAKE 1 TABLET (10 MG TOTAL) BY MOUTH DAILY., Disp: 90 tablet, Rfl: 3 .  Ciclopirox 1 % shampoo, LATHER TO SCALP 1-2 TIMES A WEEK. ALLOW TO SIT 5 MINUTES BEFORE RINSING., Disp: , Rfl: 1 .  clobetasol cream (TEMOVATE) 0.05 %, APPLY A THIN LAYER TO THE AFFECTED AREAS TWICE DAILY AS NEEDED FOR A MAX OF 4 WEEKS PER FLARE, Disp: , Rfl: 1 .  furosemide (LASIX) 40 MG tablet, Take 1 tablet (40 mg total) by mouth daily. (Patient taking differently: Take 40 mg by mouth daily as needed. ), Disp: 90 tablet, Rfl: 1 .  gabapentin (NEURONTIN) 800 MG tablet, TAKE 1 TABLET (800 MG TOTAL) BY MOUTH 4 (FOUR) TIMES DAILY., Disp: 360 tablet, Rfl: 3 .  hydroxychloroquine (PLAQUENIL) 200 MG tablet, Take 200 mg by mouth 2 (two) times daily.  , Disp: , Rfl:  .  hydrOXYzine (ATARAX/VISTARIL) 25 MG tablet, Take 0.5-1 tablets (12.5-25 mg total) by mouth at bedtime as needed (insomnia)., Disp: 30 tablet, Rfl: 2 .  lidocaine (LIDODERM) 5 %, Place 3 patches onto the skin daily. Remove & Discard patch within 12 hours or as directed by MD, Disp: 90 patch, Rfl: 1 .  methotrexate 2.5 MG  tablet, Take by mouth once a week., Disp: , Rfl:  .  mometasone (ELOCON) 0.1 % ointment, Apply topically Twice daily as needed., Disp: , Rfl:  .  nitroGLYCERIN (NITROSTAT) 0.4 MG SL tablet, Place 1 tablet (0.4 mg total) under the tongue every 5 (five) minutes as needed for chest pain., Disp: 30 tablet, Rfl: 0 .  nortriptyline (PAMELOR) 25 MG capsule, Take 1 capsule (25 mg total) by mouth 3 (three) times daily., Disp: 270 capsule, Rfl: 3 .  pantoprazole (PROTONIX) 40 MG tablet, Take 1 tablet (40 mg total) by mouth daily., Disp: 90 tablet, Rfl: 3 .  potassium chloride SA (KLOR-CON M20) 20 MEQ tablet, Take 20 mEq by mouth  daily., Disp: , Rfl:  .  predniSONE (DELTASONE) 5 MG tablet, Take 5 mg 1 tab daily, 30 days, Disp: , Rfl:  .  traMADol (ULTRAM) 50 MG tablet, Take 1 tablet (50 mg total) by mouth every 6 (six) hours as needed., Disp: 120 tablet, Rfl: 2 .  triamterene-hydrochlorothiazide (MAXZIDE-25) 37.5-25 MG tablet, Take 1 tablet by mouth daily., Disp: 90 tablet, Rfl: 3  ROS  Constitutional: Denies any fever or chills Gastrointestinal: No reported hemesis, hematochezia, vomiting, or acute GI distress Musculoskeletal: Denies any acute onset joint swelling, redness, loss of ROM, or weakness Neurological: No reported episodes of acute onset apraxia, aphasia, dysarthria, agnosia, amnesia, paralysis, loss of coordination, or loss of consciousness  Allergies  Ms. Alderfer is allergic to lisinopril.  [place]  Drug: Ms. Mcfall  reports that she does not use drugs. Alcohol:  reports that she does not drink alcohol. Tobacco:  reports that she has never smoked. She has never used smokeless tobacco. Medical:  has a past medical history of Allergy, Arthritis, Brain tumor ([place]) (05/2011), Cerebral aneurysm, nonruptured, Diabetes mellitus, Diffuse cystic mastopathy, Esophageal reflux, Hypertension, Hypopotassemia (06/01/2007), Intervertebral disc disorder with myelopathy, unspecified region, Lupus erythematosus, Migraines, Other convulsions, PLMD (periodic limb movement disorder), Presence of dental bridge, S/P bunionectomy (09/28/2013), and Unspecified sleep apnea. Surgical: Ms. Trimmer  has a past surgical history that includes Dilation and curettage of uterus; Hemorrhoid surgery; Breast surgery; Foot surgery; Brain surgery (2013); lumbar surgery (2000); Brain AVM surgery; Tonsillectomy and adenoidectomy; Spine surgery; Breast cyst aspiration (Bilateral); Hernia repair; Total abdominal hysterectomy (1979); Colonoscopy with propofol (N/A, 07/12/2016); polypectomy (07/12/2016); and Breast biopsy (Bilateral). Family: family history  includes Arthritis in her sister; Breast cancer in her paternal aunt; Cancer in her other; Cancer (age of onset: 62) in her mother; Coronary artery disease in her other; Crohn's disease in her sister; Diabetes in her daughter; Heart disease in her mother; Heart failure in her mother; Hypertension in her mother and sister; Stroke in her mother.  Constitutional Exam  General appearance: Well nourished, well developed, and well hydrated. In no apparent acute distress Vitals:   09/12/17 1213  BP: (!) 141/91  Pulse: 62  Resp: 16  Temp: 98 F (36.7 C)  TempSrc: Oral  SpO2: 97%  Weight: 188 lb (85.3 kg)  Height: '5\' 1"'  (1.549 m)   BMI Assessment: Estimated body mass index is 35.52 kg/m as calculated from the following:   Height as of this encounter: '5\' 1"'  (1.549 m).   Weight as of this encounter: 188 lb (85.3 kg).  BMI interpretation table: BMI level Category Range association with higher incidence of chronic pain  <18 kg/m2 Underweight   18.5-24.9 kg/m2 Ideal body weight   25-29.9 kg/m2 Overweight Increased incidence by 20%  30-34.9 kg/m2 Obese (Class I) Increased incidence by 68%  35-39.9 kg/m2 Severe obesity (Class II) Increased incidence by 136%  >40 kg/m2 Extreme obesity (Class III) Increased incidence by 254%   Patient's current BMI Ideal Body weight  Body mass index is 35.52 kg/m. Ideal body weight: 47.8 kg (105 lb 6.1 oz) Adjusted ideal body weight: 62.8 kg (138 lb 6.8 oz)   BMI Readings from Last 4 Encounters:  09/12/17 35.52 kg/m  08/15/17 34.96 kg/m  07/27/17 34.75 kg/m  06/15/17 36.47 kg/m   Wt Readings from Last 4 Encounters:  09/12/17 188 lb (85.3 kg)  08/15/17 185 lb (83.9 kg)  07/27/17 190 lb (86.2 kg)  06/15/17 193 lb (87.5 kg)  Psych/Mental status: Alert, oriented x 3 (person, place, & time)       Eyes: PERLA Respiratory: No evidence of acute respiratory distress  Cervical Spine Area Exam  Skin & Axial Inspection: No masses, redness, edema, swelling,  or associated skin lesions Alignment: Symmetrical Functional ROM: Unrestricted ROM      Stability: No instability detected Muscle Tone/Strength: Functionally intact. No obvious neuro-muscular anomalies detected. Sensory (Neurological): Unimpaired Palpation: No palpable anomalies              Upper Extremity (UE) Exam    Side: Right upper extremity  Side: Left upper extremity  Skin & Extremity Inspection: Skin color, temperature, and hair growth are WNL. No peripheral edema or cyanosis. No masses, redness, swelling, asymmetry, or associated skin lesions. No contractures.  Skin & Extremity Inspection: Skin color, temperature, and hair growth are WNL. No peripheral edema or cyanosis. No masses, redness, swelling, asymmetry, or associated skin lesions. No contractures.  Functional ROM: Unrestricted ROM          Functional ROM: Unrestricted ROM          Muscle Tone/Strength: Functionally intact. No obvious neuro-muscular anomalies detected.  Muscle Tone/Strength: Functionally intact. No obvious neuro-muscular anomalies detected.  Sensory (Neurological): Unimpaired          Sensory (Neurological): Unimpaired          Palpation: No palpable anomalies              Palpation: No palpable anomalies              Provocative Test(s):  Phalen's test: deferred Tinel's test: deferred Apley's scratch test (touch opposite shoulder):  Action 1 (Across chest): deferred Action 2 (Overhead): deferred Action 3 (LB reach): deferred   Provocative Test(s):  Phalen's test: deferred Tinel's test: deferred Apley's scratch test (touch opposite shoulder):  Action 1 (Across chest): deferred Action 2 (Overhead): deferred Action 3 (LB reach): deferred    Thoracic Spine Area Exam  Skin & Axial Inspection: No masses, redness, or swelling Alignment: Symmetrical Functional ROM: Unrestricted ROM Stability: No instability detected Muscle Tone/Strength: Functionally intact. No obvious neuro-muscular anomalies  detected. Sensory (Neurological): Unimpaired Muscle strength & Tone: No palpable anomalies  Lumbar Spine Area Exam  Skin & Axial Inspection: No masses, redness, or swelling Alignment: Symmetrical Functional ROM: Decreased ROM affecting primarily the left Stability: No instability detected Muscle Tone/Strength: Functionally intact. No obvious neuro-muscular anomalies detected. Sensory (Neurological): Dermatomal pain pattern Palpation: No palpable anomalies       Provocative Tests: Lumbar Hyperextension/rotation test: (+) due to pain. Lumbar quadrant test (Kemp's test): deferred today       Lumbar Lateral bending test: (+) ipsilateral radicular pain, on the left. Positive for left-sided foraminal stenosis. Patrick's Maneuver: deferred today  FABER test: deferred today                   Thigh-thrust test: deferred today       S-I compression test: deferred today       S-I distraction test: deferred today        Gait & Posture Assessment  Ambulation: Unassisted Gait: Relatively normal for age and body habitus Posture: WNL   Lower Extremity Exam    Side: Right lower extremity  Side: Left lower extremity  Stability: No instability observed          Stability: No instability observed          Skin & Extremity Inspection: Skin color, temperature, and hair growth are WNL. No peripheral edema or cyanosis. No masses, redness, swelling, asymmetry, or associated skin lesions. No contractures.  Skin & Extremity Inspection: Skin color, temperature, and hair growth are WNL. No peripheral edema or cyanosis. No masses, redness, swelling, asymmetry, or associated skin lesions. No contractures.  Functional ROM: Unrestricted ROM                  Functional ROM: Unrestricted ROM                  Muscle Tone/Strength: Functionally intact. No obvious neuro-muscular anomalies detected.  Muscle Tone/Strength: Functionally intact. No obvious neuro-muscular anomalies detected.  Sensory  (Neurological): Unimpaired  Sensory (Neurological): Unimpaired  Palpation: No palpable anomalies  Palpation: No palpable anomalies   Assessment  Primary Diagnosis & Pertinent Problem List: The primary encounter diagnosis was Lumbar radiculopathy. Diagnoses of Hx of lumbosacral spine surgery (Left L5/S1 foraminotomy) and Spinal stenosis of lumbar region without neurogenic claudication were also pertinent to this visit.  Status Diagnosis  Responding Persistent Persistent 1. Lumbar radiculopathy   2. Hx of lumbosacral spine surgery (Left L5/S1 foraminotomy)   3. Spinal stenosis of lumbar region without neurogenic claudication      General Recommendations: The pain condition that the patient suffers from is best treated with a multidisciplinary approach that involves an increase in physical activity to prevent de-conditioning and worsening of the pain cycle, as well as psychological counseling (formal and/or informal) to address the co-morbid psychological affects of pain. Treatment will often involve judicious use of pain medications and interventional procedures to decrease the pain, allowing the patient to participate in the physical activity that will ultimately produce long-lasting pain reductions. The goal of the multidisciplinary approach is to return the patient to a higher level of overall function and to restore their ability to perform activities of daily living.  67 year old female with a history of left hip and left leg pain that starts primarily at her left distal thigh and extends down to her left ankle in a dermatomal fashion.  Patient is status post greater trochanteric bursa injections on 05/13/2017 with Dr. Candelaria Stagers which were not effective for her hip pain.  Patient does have a history of shingles last year.  She has had 2 previous lumbar spine surgeries including history of L5-S1 foraminotomy as well as left L5-S1 laminectomy.  Patient follows up today status post left L3-L4 ESI.   Patient states that the injection was effective for her pain and range of motion.  She states that over the last week she has experienced return of her left leg pain.  However for 2 to 3 weeks after the procedure, patient states that she had improved range of motion and was able to ambulate for an extended period of time  without having as much pain.  We discussed repeating epidural steroid injection #2, adding more volume to see if we can extend her pain relief duration.  Risks and benefits of procedure discussed and patient would like to proceed.  Plan: -Left L3-4 ESI #2.  Patient does have history of left L5-S1 laminectomy. -Possible SCS in future -Continue medication management with PCP.  Plan of Care   Lab-work, procedure(s), and/or referral(s): Orders Placed This Encounter  Procedures  . Lumbar Epidural Injection     Interventional management options: Ms. Rounds was informed that there is no guarantee that she would be a candidate for interventional therapies. The decision will be based on the results of diagnostic studies, as well as Ms. Pineda's risk profile.  Procedure(s) under consideration:  Lumbar epidural steroid injection Lumbar facet medial branch nerve blocks with possible lumbar radio frequency ablation Thoracolumbar spinal cord stimulation   Time Note: Greater than 50% of the 25 minute(s) of face-to-face time spent with Ms. Seabury, was spent in counseling/coordination of care regarding: Ms. Foxworthy primary cause of pain, the treatment plan, treatment alternatives, the risks and possible complications of proposed treatment, going over the informed consent, the results, interpretation and significance of  her recent diagnostic interventional treatment(s), realistic expectations and the goals of pain management (increased in functionality).  Provider-requested follow-up: Return in about 1 week (around 09/19/2017) for Procedure.  Future Appointments  Date Time Provider  [place]  09/19/2017  8:00 AM Gillis Santa, MD [facility] None    Primary Care Physician: Wardell Honour, MD Location: [facility] Outpatient Pain Management Facility Note by: Gillis Santa, M.D Date: 09/12/2017; Time: 3:24 PM  Patient Instructions   GENERAL RISKS AND COMPLICATIONS  What are the risk, side effects and possible complications? Generally speaking, most procedures are safe.  However, with any procedure there are risks, side effects, and the possibility of complications.  The risks and complications are dependent upon the sites that are lesioned, or the type of nerve block to be performed.  The closer the procedure is to the spine, the more serious the risks are.  Great care is taken when placing the radio frequency needles, block needles or lesioning probes, but sometimes complications can occur. 1. Infection: Any time there is an injection through the skin, there is a risk of infection.  This is why sterile conditions are used for these blocks.  There are four possible types of infection. 1. Localized skin infection. 2. Central Nervous System Infection-This can be in the form of Meningitis, which can be deadly. 3. Epidural Infections-This can be in the form of an epidural abscess, which can cause pressure inside of the spine, causing compression of the spinal cord with subsequent paralysis. This would require an emergency surgery to decompress, and there are no guarantees that the patient would recover from the paralysis. 4. Discitis-This is an infection of the intervertebral discs.  It occurs in about 1% of discography procedures.  It is difficult to treat and it may lead to surgery.        2. Pain: the needles have to go through skin and soft tissues, will cause soreness.       3. Damage to internal structures:  The nerves to be lesioned may be near blood vessels or    other nerves which can be potentially damaged.       4. Bleeding: Bleeding is more common if the patient is  taking blood thinners such as  aspirin, Coumadin, Ticiid, Plavix, etc., or  if he/she have some genetic predisposition  such as hemophilia. Bleeding into the spinal canal can cause compression of the spinal  cord with subsequent paralysis.  This would require an emergency surgery to  decompress and there are no guarantees that the patient would recover from the  paralysis.       5. Pneumothorax:  Puncturing of a lung is a possibility, every time a needle is introduced in  the area of the chest or upper back.  Pneumothorax refers to free air around the  collapsed lung(s), inside of the thoracic cavity (chest cavity).  Another two possible  complications related to a similar event would include: Hemothorax and Chylothorax.   These are variations of the Pneumothorax, where instead of air around the collapsed  lung(s), you may have blood or chyle, respectively.       6. Spinal headaches: They may occur with any procedures in the area of the spine.       7. Persistent CSF (Cerebro-Spinal Fluid) leakage: This is a rare problem, but may occur  with prolonged intrathecal or epidural catheters either due to the formation of a fistulous  track or a dural tear.       8. Nerve damage: By working so close to the spinal cord, there is always a possibility of  nerve damage, which could be as serious as a permanent spinal cord injury with  paralysis.       9. Death:  Although rare, severe deadly allergic reactions known as "Anaphylactic  reaction" can occur to any of the medications used.      10. Worsening of the symptoms:  We can always make thing worse.  What are the chances of something like this happening? Chances of any of this occuring are extremely low.  By statistics, you have more of a chance of getting killed in a motor vehicle accident: while driving to the hospital than any of the above occurring .  Nevertheless, you should be aware that they are possibilities.  In general, it is similar to taking a shower.   Everybody knows that you can slip, hit your head and get killed.  Does that mean that you should not shower again?  Nevertheless always keep in mind that statistics do not mean anything if you happen to be on the wrong side of them.  Even if a procedure has a 1 (one) in a 1,000,000 (million) chance of going wrong, it you happen to be that one..Also, keep in mind that by statistics, you have more of a chance of having something go wrong when taking medications.  Who should not have this procedure? If you are on a blood thinning medication (e.g. Coumadin, Plavix, see list of "Blood Thinners"), or if you have an active infection going on, you should not have the procedure.  If you are taking any blood thinners, please inform your physician.  How should I prepare for this procedure?  Do not eat or drink anything at least six hours prior to the procedure.  Bring a driver with you .  It cannot be a taxi.  Come accompanied by an adult that can drive you back, and that is strong enough to help you if your legs get weak or numb from the local anesthetic.  Take all of your medicines the morning of the procedure with just enough water to swallow them.  If you have diabetes, make sure that you are scheduled to have your procedure done first thing in the  morning, whenever possible.  If you have diabetes, take only half of your insulin dose and notify our nurse that you have done so as soon as you arrive at the clinic.  If you are diabetic, but only take blood sugar pills (oral hypoglycemic), then do not take them on the morning of your procedure.  You may take them after you have had the procedure.  Do not take aspirin or any aspirin-containing medications, at least eleven (11) days prior to the procedure.  They may prolong bleeding.  Wear loose fitting clothing that may be easy to take off and that you would not mind if it got stained with Betadine or blood.  Do not wear any jewelry or perfume  Remove  any nail coloring.  It will interfere with some of our monitoring equipment.  NOTE: Remember that this is not meant to be interpreted as a complete list of all possible complications.  Unforeseen problems may occur.  BLOOD THINNERS The following drugs contain aspirin or other products, which can cause increased bleeding during surgery and should not be taken for 2 weeks prior to and 1 week after surgery.  If you should need take something for relief of minor pain, you may take acetaminophen which is found in Tylenol,m Datril, Anacin-3 and Panadol. It is not blood thinner. The products listed below are.  Do not take any of the products listed below in addition to any listed on your instruction sheet.  A.P.C or A.P.C with Codeine Codeine Phosphate Capsules #3 Ibuprofen Ridaura  ABC compound Congesprin Imuran rimadil  Advil Cope Indocin Robaxisal  Alka-Seltzer Effervescent Pain Reliever and Antacid Coricidin or Coricidin-D  Indomethacin Rufen  Alka-Seltzer plus Cold Medicine Cosprin Ketoprofen S-A-C Tablets  Anacin Analgesic Tablets or Capsules Coumadin Korlgesic Salflex  Anacin Extra Strength Analgesic tablets or capsules CP-2 Tablets Lanoril Salicylate  Anaprox Cuprimine Capsules Levenox Salocol  Anexsia-D Dalteparin Magan Salsalate  Anodynos Darvon compound Magnesium Salicylate Sine-off  Ansaid Dasin Capsules Magsal Sodium Salicylate  Anturane Depen Capsules Marnal Soma  APF Arthritis pain formula Dewitt's Pills Measurin Stanback  Argesic Dia-Gesic Meclofenamic Sulfinpyrazone  Arthritis Bayer Timed Release Aspirin Diclofenac Meclomen Sulindac  Arthritis pain formula Anacin Dicumarol Medipren Supac  Analgesic (Safety coated) Arthralgen Diffunasal Mefanamic Suprofen  Arthritis Strength Bufferin Dihydrocodeine Mepro Compound Suprol  Arthropan liquid Dopirydamole Methcarbomol with Aspirin Synalgos  ASA tablets/Enseals Disalcid Micrainin Tagament  Ascriptin Doan's Midol Talwin  Ascriptin A/D  Dolene Mobidin Tanderil  Ascriptin Extra Strength Dolobid Moblgesic Ticlid  Ascriptin with Codeine Doloprin or Doloprin with Codeine Momentum Tolectin  Asperbuf Duoprin Mono-gesic Trendar  Aspergum Duradyne Motrin or Motrin IB Triminicin  Aspirin plain, buffered or enteric coated Durasal Myochrisine Trigesic  Aspirin Suppositories Easprin Nalfon Trillsate  Aspirin with Codeine Ecotrin Regular or Extra Strength Naprosyn Uracel  Atromid-S Efficin Naproxen Ursinus  Auranofin Capsules Elmiron Neocylate Vanquish  Axotal Emagrin Norgesic Verin  Azathioprine Empirin or Empirin with Codeine Normiflo Vitamin E  Azolid Emprazil Nuprin Voltaren  Bayer Aspirin plain, buffered or children's or timed BC Tablets or powders Encaprin Orgaran Warfarin Sodium  Buff-a-Comp Enoxaparin Orudis Zorpin  Buff-a-Comp with Codeine Equegesic Os-Cal-Gesic   Buffaprin Excedrin plain, buffered or Extra Strength Oxalid   Bufferin Arthritis Strength Feldene Oxphenbutazone   Bufferin plain or Extra Strength Feldene Capsules Oxycodone with Aspirin   Bufferin with Codeine Fenoprofen Fenoprofen Pabalate or Pabalate-SF   Buffets II Flogesic Panagesic   Buffinol plain or Extra Strength Florinal or Florinal with Codeine Panwarfarin   Buf-Tabs Flurbiprofen  Penicillamine   Butalbital Compound Four-way cold tablets Penicillin   Butazolidin Fragmin Pepto-Bismol   Carbenicillin Geminisyn Percodan   Carna Arthritis Reliever Geopen Persantine   Carprofen Gold's salt Persistin   Chloramphenicol Goody's Phenylbutazone   Chloromycetin Haltrain Piroxlcam   Clmetidine heparin Plaquenil   Cllnoril Hyco-pap Ponstel   Clofibrate Hydroxy chloroquine Propoxyphen         Before stopping any of these medications, be sure to consult the physician who ordered them.  Some, such as Coumadin (Warfarin) are ordered to prevent or treat serious conditions such as "deep thrombosis", "pumonary embolisms", and other heart problems.  The amount of time  that you may need off of the medication may also vary with the medication and the reason for which you were taking it.  If you are taking any of these medications, please make sure you notify your pain physician before you undergo any procedures.         Epidural Steroid Injection Patient Information  Description: The epidural space surrounds the nerves as they exit the spinal cord.  In some patients, the nerves can be compressed and inflamed by a bulging disc or a tight spinal canal (spinal stenosis).  By injecting steroids into the epidural space, we can bring irritated nerves into direct contact with a potentially helpful medication.  These steroids act directly on the irritated nerves and can reduce swelling and inflammation which often leads to decreased pain.  Epidural steroids may be injected anywhere along the spine and from the neck to the low back depending upon the location of your pain.   After numbing the skin with local anesthetic (like Novocaine), a small needle is passed into the epidural space slowly.  You may experience a sensation of pressure while this is being done.  The entire block usually last less than 10 minutes.  Conditions which may be treated by epidural steroids:   Low back and leg pain  Neck and arm pain  Spinal stenosis  Post-laminectomy syndrome  Herpes zoster (shingles) pain  Pain from compression fractures  Preparation for the injection:  1. Do not eat any solid food or dairy products within 8 hours of your appointment.  2. You may drink clear liquids up to 3 hours before appointment.  Clear liquids include water, black coffee, juice or soda.  No milk or cream please. 3. You may take your regular medication, including pain medications, with a sip of water before your appointment  Diabetics should hold regular insulin (if taken separately) and take 1/2 normal NPH dos the morning of the procedure.  Carry some sugar containing items with you to your  appointment. 4. A driver must accompany you and be prepared to drive you home after your procedure.  5. Bring all your current medications with your. 6. An IV may be inserted and sedation may be given at the discretion of the physician.   7. A blood pressure cuff, EKG and other monitors will often be applied during the procedure.  Some patients may need to have extra oxygen administered for a short period. 8. You will be asked to provide medical information, including your allergies, prior to the procedure.  We must know immediately if you are taking blood thinners (like Coumadin/Warfarin)  Or if you are allergic to IV iodine contrast (dye). We must know if you could possible be pregnant.  Possible side-effects:  Bleeding from needle site  Infection (rare, may require surgery)  Nerve injury (rare)  Numbness & tingling (temporary)  Difficulty urinating (rare, temporary)  Spinal headache ( a headache worse with upright posture)  Light -headedness (temporary)  Pain at injection site (several days)  Decreased blood pressure (temporary)  Weakness in arm/leg (temporary)  Pressure sensation in back/neck (temporary)  Call if you experience:  Fever/chills associated with headache or increased back/neck pain.  Headache worsened by an upright position.  New onset weakness or numbness of an extremity below the injection site  Hives or difficulty breathing (go to the emergency room)  Inflammation or drainage at the infection site  Severe back/neck pain  Any new symptoms which are concerning to you  Please note:  Although the local anesthetic injected can often make your back or neck feel good for several hours after the injection, the pain will likely return.  It takes 3-7 days for steroids to work in the epidural space.  You may not notice any pain relief for at least that one week.  If effective, we will often do a series of three injections spaced 3-6 weeks apart to maximally  decrease your pain.  After the initial series, we generally will wait several months before considering a repeat injection of the same type.  If you have any questions, please call [phone] [facility]

## 2017-09-12 NOTE — Progress Notes (Signed)
Safety precautions to be maintained throughout the outpatient stay will include: orient to surroundings, keep bed in low position, maintain call bell within reach at all times, provide assistance with transfer out of bed and ambulation.  

## 2017-09-12 NOTE — Patient Instructions (Signed)
GENERAL RISKS AND COMPLICATIONS  What are the risk, side effects and possible complications? Generally speaking, most procedures are safe.  However, with any procedure there are risks, side effects, and the possibility of complications.  The risks and complications are dependent upon the sites that are lesioned, or the type of nerve block to be performed.  The closer the procedure is to the spine, the more serious the risks are.  Great care is taken when placing the radio frequency needles, block needles or lesioning probes, but sometimes complications can occur. 1. Infection: Any time there is an injection through the skin, there is a risk of infection.  This is why sterile conditions are used for these blocks.  There are four possible types of infection. 1. Localized skin infection. 2. Central Nervous System Infection-This can be in the form of Meningitis, which can be deadly. 3. Epidural Infections-This can be in the form of an epidural abscess, which can cause pressure inside of the spine, causing compression of the spinal cord with subsequent paralysis. This would require an emergency surgery to decompress, and there are no guarantees that the patient would recover from the paralysis. 4. Discitis-This is an infection of the intervertebral discs.  It occurs in about 1% of discography procedures.  It is difficult to treat and it may lead to surgery.        2. Pain: the needles have to go through skin and soft tissues, will cause soreness.       3. Damage to internal structures:  The nerves to be lesioned may be near blood vessels or    other nerves which can be potentially damaged.       4. Bleeding: Bleeding is more common if the patient is taking blood thinners such as  aspirin, Coumadin, Ticiid, Plavix, etc., or if he/she have some genetic predisposition  such as hemophilia. Bleeding into the spinal canal can cause compression of the spinal  cord with subsequent paralysis.  This would require an  emergency surgery to  decompress and there are no guarantees that the patient would recover from the  paralysis.       5. Pneumothorax:  Puncturing of a lung is a possibility, every time a needle is introduced in  the area of the chest or upper back.  Pneumothorax refers to free air around the  collapsed lung(s), inside of the thoracic cavity (chest cavity).  Another two possible  complications related to a similar event would include: Hemothorax and Chylothorax.   These are variations of the Pneumothorax, where instead of air around the collapsed  lung(s), you may have blood or chyle, respectively.       6. Spinal headaches: They may occur with any procedures in the area of the spine.       7. Persistent CSF (Cerebro-Spinal Fluid) leakage: This is a rare problem, but may occur  with prolonged intrathecal or epidural catheters either due to the formation of a fistulous  track or a dural tear.       8. Nerve damage: By working so close to the spinal cord, there is always a possibility of  nerve damage, which could be as serious as a permanent spinal cord injury with  paralysis.       9. Death:  Although rare, severe deadly allergic reactions known as "Anaphylactic  reaction" can occur to any of the medications used.      10. Worsening of the symptoms:  We can always make thing worse.    What are the chances of something like this happening? Chances of any of this occuring are extremely low.  By statistics, you have more of a chance of getting killed in a motor vehicle accident: while driving to the hospital than any of the above occurring .  Nevertheless, you should be aware that they are possibilities.  In general, it is similar to taking a shower.  Everybody knows that you can slip, hit your head and get killed.  Does that mean that you should not shower again?  Nevertheless always keep in mind that statistics do not mean anything if you happen to be on the wrong side of them.  Even if a procedure has a 1  (one) in a 1,000,000 (million) chance of going wrong, it you happen to be that one..Also, keep in mind that by statistics, you have more of a chance of having something go wrong when taking medications.  Who should not have this procedure? If you are on a blood thinning medication (e.g. Coumadin, Plavix, see list of "Blood Thinners"), or if you have an active infection going on, you should not have the procedure.  If you are taking any blood thinners, please inform your physician.  How should I prepare for this procedure?  Do not eat or drink anything at least six hours prior to the procedure.  Bring a driver with you .  It cannot be a taxi.  Come accompanied by an adult that can drive you back, and that is strong enough to help you if your legs get weak or numb from the local anesthetic.  Take all of your medicines the morning of the procedure with just enough water to swallow them.  If you have diabetes, make sure that you are scheduled to have your procedure done first thing in the morning, whenever possible.  If you have diabetes, take only half of your insulin dose and notify our nurse that you have done so as soon as you arrive at the clinic.  If you are diabetic, but only take blood sugar pills (oral hypoglycemic), then do not take them on the morning of your procedure.  You may take them after you have had the procedure.  Do not take aspirin or any aspirin-containing medications, at least eleven (11) days prior to the procedure.  They may prolong bleeding.  Wear loose fitting clothing that may be easy to take off and that you would not mind if it got stained with Betadine or blood.  Do not wear any jewelry or perfume  Remove any nail coloring.  It will interfere with some of our monitoring equipment.  NOTE: Remember that this is not meant to be interpreted as a complete list of all possible complications.  Unforeseen problems may occur.  BLOOD THINNERS The following drugs  contain aspirin or other products, which can cause increased bleeding during surgery and should not be taken for 2 weeks prior to and 1 week after surgery.  If you should need take something for relief of minor pain, you may take acetaminophen which is found in Tylenol,m Datril, Anacin-3 and Panadol. It is not blood thinner. The products listed below are.  Do not take any of the products listed below in addition to any listed on your instruction sheet.  A.P.C or A.P.C with Codeine Codeine Phosphate Capsules #3 Ibuprofen Ridaura  ABC compound Congesprin Imuran rimadil  Advil Cope Indocin Robaxisal  Alka-Seltzer Effervescent Pain Reliever and Antacid Coricidin or Coricidin-D  Indomethacin Rufen    Alka-Seltzer plus Cold Medicine Cosprin Ketoprofen S-A-C Tablets  Anacin Analgesic Tablets or Capsules Coumadin Korlgesic Salflex  Anacin Extra Strength Analgesic tablets or capsules CP-2 Tablets Lanoril Salicylate  Anaprox Cuprimine Capsules Levenox Salocol  Anexsia-D Dalteparin Magan Salsalate  Anodynos Darvon compound Magnesium Salicylate Sine-off  Ansaid Dasin Capsules Magsal Sodium Salicylate  Anturane Depen Capsules Marnal Soma  APF Arthritis pain formula Dewitt's Pills Measurin Stanback  Argesic Dia-Gesic Meclofenamic Sulfinpyrazone  Arthritis Bayer Timed Release Aspirin Diclofenac Meclomen Sulindac  Arthritis pain formula Anacin Dicumarol Medipren Supac  Analgesic (Safety coated) Arthralgen Diffunasal Mefanamic Suprofen  Arthritis Strength Bufferin Dihydrocodeine Mepro Compound Suprol  Arthropan liquid Dopirydamole Methcarbomol with Aspirin Synalgos  ASA tablets/Enseals Disalcid Micrainin Tagament  Ascriptin Doan's Midol Talwin  Ascriptin A/D Dolene Mobidin Tanderil  Ascriptin Extra Strength Dolobid Moblgesic Ticlid  Ascriptin with Codeine Doloprin or Doloprin with Codeine Momentum Tolectin  Asperbuf Duoprin Mono-gesic Trendar  Aspergum Duradyne Motrin or Motrin IB Triminicin  Aspirin  plain, buffered or enteric coated Durasal Myochrisine Trigesic  Aspirin Suppositories Easprin Nalfon Trillsate  Aspirin with Codeine Ecotrin Regular or Extra Strength Naprosyn Uracel  Atromid-S Efficin Naproxen Ursinus  Auranofin Capsules Elmiron Neocylate Vanquish  Axotal Emagrin Norgesic Verin  Azathioprine Empirin or Empirin with Codeine Normiflo Vitamin E  Azolid Emprazil Nuprin Voltaren  Bayer Aspirin plain, buffered or children's or timed BC Tablets or powders Encaprin Orgaran Warfarin Sodium  Buff-a-Comp Enoxaparin Orudis Zorpin  Buff-a-Comp with Codeine Equegesic Os-Cal-Gesic   Buffaprin Excedrin plain, buffered or Extra Strength Oxalid   Bufferin Arthritis Strength Feldene Oxphenbutazone   Bufferin plain or Extra Strength Feldene Capsules Oxycodone with Aspirin   Bufferin with Codeine Fenoprofen Fenoprofen Pabalate or Pabalate-SF   Buffets II Flogesic Panagesic   Buffinol plain or Extra Strength Florinal or Florinal with Codeine Panwarfarin   Buf-Tabs Flurbiprofen Penicillamine   Butalbital Compound Four-way cold tablets Penicillin   Butazolidin Fragmin Pepto-Bismol   Carbenicillin Geminisyn Percodan   Carna Arthritis Reliever Geopen Persantine   Carprofen Gold's salt Persistin   Chloramphenicol Goody's Phenylbutazone   Chloromycetin Haltrain Piroxlcam   Clmetidine heparin Plaquenil   Cllnoril Hyco-pap Ponstel   Clofibrate Hydroxy chloroquine Propoxyphen         Before stopping any of these medications, be sure to consult the physician who ordered them.  Some, such as Coumadin (Warfarin) are ordered to prevent or treat serious conditions such as "deep thrombosis", "pumonary embolisms", and other heart problems.  The amount of time that you may need off of the medication may also vary with the medication and the reason for which you were taking it.  If you are taking any of these medications, please make sure you notify your pain physician before you undergo any  procedures.         Epidural Steroid Injection Patient Information  Description: The epidural space surrounds the nerves as they exit the spinal cord.  In some patients, the nerves can be compressed and inflamed by a bulging disc or a tight spinal canal (spinal stenosis).  By injecting steroids into the epidural space, we can bring irritated nerves into direct contact with a potentially helpful medication.  These steroids act directly on the irritated nerves and can reduce swelling and inflammation which often leads to decreased pain.  Epidural steroids may be injected anywhere along the spine and from the neck to the low back depending upon the location of your pain.   After numbing the skin with local anesthetic (like Novocaine), a small needle is passed   into the epidural space slowly.  You may experience a sensation of pressure while this is being done.  The entire block usually last less than 10 minutes.  Conditions which may be treated by epidural steroids:   Low back and leg pain  Neck and arm pain  Spinal stenosis  Post-laminectomy syndrome  Herpes zoster (shingles) pain  Pain from compression fractures  Preparation for the injection:  1. Do not eat any solid food or dairy products within 8 hours of your appointment.  2. You may drink clear liquids up to 3 hours before appointment.  Clear liquids include water, black coffee, juice or soda.  No milk or cream please. 3. You may take your regular medication, including pain medications, with a sip of water before your appointment  Diabetics should hold regular insulin (if taken separately) and take 1/2 normal NPH dos the morning of the procedure.  Carry some sugar containing items with you to your appointment. 4. A driver must accompany you and be prepared to drive you home after your procedure.  5. Bring all your current medications with your. 6. An IV may be inserted and sedation may be given at the discretion of the  physician.   7. A blood pressure cuff, EKG and other monitors will often be applied during the procedure.  Some patients may need to have extra oxygen administered for a short period. 8. You will be asked to provide medical information, including your allergies, prior to the procedure.  We must know immediately if you are taking blood thinners (like Coumadin/Warfarin)  Or if you are allergic to IV iodine contrast (dye). We must know if you could possible be pregnant.  Possible side-effects:  Bleeding from needle site  Infection (rare, may require surgery)  Nerve injury (rare)  Numbness & tingling (temporary)  Difficulty urinating (rare, temporary)  Spinal headache ( a headache worse with upright posture)  Light -headedness (temporary)  Pain at injection site (several days)  Decreased blood pressure (temporary)  Weakness in arm/leg (temporary)  Pressure sensation in back/neck (temporary)  Call if you experience:  Fever/chills associated with headache or increased back/neck pain.  Headache worsened by an upright position.  New onset weakness or numbness of an extremity below the injection site  Hives or difficulty breathing (go to the emergency room)  Inflammation or drainage at the infection site  Severe back/neck pain  Any new symptoms which are concerning to you  Please note:  Although the local anesthetic injected can often make your back or neck feel good for several hours after the injection, the pain will likely return.  It takes 3-7 days for steroids to work in the epidural space.  You may not notice any pain relief for at least that one week.  If effective, we will often do a series of three injections spaced 3-6 weeks apart to maximally decrease your pain.  After the initial series, we generally will wait several months before considering a repeat injection of the same type.  If you have any questions, please call [phone] [facility] 

## 2017-09-19 ENCOUNTER — Encounter: Payer: Self-pay | Admitting: Student in an Organized Health Care Education/Training Program

## 2017-09-19 ENCOUNTER — Ambulatory Visit
Admission: RE | Admit: 2017-09-19 | Discharge: 2017-09-19 | Disposition: A | Discharge status: Home / Self Care | Payer: 59 | Source: Ambulatory Visit | Attending: Student in an Organized Health Care Education/Training Program | Admitting: Student in an Organized Health Care Education/Training Program

## 2017-09-19 ENCOUNTER — Other Ambulatory Visit: Payer: Self-pay

## 2017-09-19 ENCOUNTER — Ambulatory Visit (HOSPITAL_BASED_OUTPATIENT_CLINIC_OR_DEPARTMENT_OTHER): Payer: 59 | Admitting: Student in an Organized Health Care Education/Training Program

## 2017-09-19 VITALS — BP 140/96 | HR 66 | Temp 97.9°F | Resp 17 | Ht 62.0 in | Wt 185.0 lb

## 2017-09-19 DIAGNOSIS — Z79899 Other long term (current) drug therapy: Secondary | ICD-10-CM | POA: Insufficient documentation

## 2017-09-19 DIAGNOSIS — M79606 Pain in leg, unspecified: Secondary | ICD-10-CM | POA: Diagnosis not present

## 2017-09-19 DIAGNOSIS — M5416 Radiculopathy, lumbar region: Secondary | ICD-10-CM

## 2017-09-19 DIAGNOSIS — Z888 Allergy status to other drugs, medicaments and biological substances status: Secondary | ICD-10-CM | POA: Diagnosis not present

## 2017-09-19 MED ORDER — DEXAMETHASONE SODIUM PHOSPHATE 10 MG/ML IJ SOLN
10.0000 mg | Freq: Once | INTRAMUSCULAR | Status: AC
  Administered 2017-09-19: 10 mg

## 2017-09-19 MED ORDER — DEXAMETHASONE SODIUM PHOSPHATE 10 MG/ML IJ SOLN
INTRAMUSCULAR | Status: AC
  Filled 2017-09-19: qty 1

## 2017-09-19 MED ORDER — IOPAMIDOL (ISOVUE-M 200) INJECTION 41%
10.0000 mL | Freq: Once | INTRAMUSCULAR | Status: AC
  Administered 2017-09-19: 10 mL via EPIDURAL
  Filled 2017-09-19: qty 10

## 2017-09-19 MED ORDER — ROPIVACAINE HCL 2 MG/ML IJ SOLN
INTRAMUSCULAR | Status: AC
  Filled 2017-09-19: qty 10

## 2017-09-19 MED ORDER — SODIUM CHLORIDE 0.9 % IJ SOLN
INTRAMUSCULAR | Status: AC
  Filled 2017-09-19: qty 10

## 2017-09-19 MED ORDER — ROPIVACAINE HCL 2 MG/ML IJ SOLN
2.0000 mL | Freq: Once | INTRAMUSCULAR | Status: AC
  Administered 2017-09-19: 10 mL via EPIDURAL

## 2017-09-19 MED ORDER — SODIUM CHLORIDE 0.9% FLUSH
2.0000 mL | Freq: Once | INTRAVENOUS | Status: AC
  Administered 2017-09-19: 2 mL

## 2017-09-19 MED ORDER — LIDOCAINE HCL 2 % IJ SOLN
10.0000 mL | Freq: Once | INTRAMUSCULAR | Status: AC
Start: 2017-09-19 — End: 2017-09-19
  Administered 2017-09-19: 400 mg

## 2017-09-19 MED ORDER — LIDOCAINE HCL 2 % IJ SOLN
INTRAMUSCULAR | Status: AC
  Filled 2017-09-19: qty 20

## 2017-09-19 NOTE — Progress Notes (Signed)
Safety precautions to be maintained throughout the outpatient stay will include: orient to surroundings, keep bed in low position, maintain call bell within reach at all times, provide assistance with transfer out of bed and ambulation.  

## 2017-09-19 NOTE — Progress Notes (Signed)
Patient's Name: Maureen Hodge  MRN: [id number]  Referring Provider: Wardell Honour, MD  DOB: 08/21/1950  PCP: Wardell Honour, MD  DOS: 09/19/2017  Note by: Gillis Santa, MD  Service setting: Ambulatory outpatient  Specialty: Interventional Pain Management  Patient type: Established  Location: [facility] (AMB) Pain Management Facility  Visit type: Interventional Procedure   Primary Reason for Visit: Interventional Pain Management Treatment. CC: Back Pain (lower) and Leg Pain (leg)  Procedure:          Anesthesia, Analgesia, Anxiolysis:  Type: Therapeutic Inter-Laminar Epidural Steroid Injection #2  Region: Lumbar Level: L2-3 Level. Laterality: Left-Sided         Type: Local Anesthesia Indication(s): Analgesia         Route: Infiltration (Campbell/IM) IV Access: Declined Sedation: Declined  Local Anesthetic: Lidocaine 1-2%   Indications: 1. Lumbar radiculopathy    Pain Score: Pre-procedure: 9 /10 Post-procedure: 0-No pain/10  Pre-op Assessment:  Maureen Hodge is a 67 y.o. (year old), female patient, seen today for interventional treatment. She  has a past surgical history that includes Dilation and curettage of uterus; Hemorrhoid surgery; Breast surgery; Foot surgery; Brain surgery (2013); lumbar surgery (2000); Brain AVM surgery; Tonsillectomy and adenoidectomy; Spine surgery; Breast cyst aspiration (Bilateral); Hernia repair; Total abdominal hysterectomy (1979); Colonoscopy with propofol (N/A, 07/12/2016); polypectomy (07/12/2016); and Breast biopsy (Bilateral). Maureen Hodge has a current medication list which includes the following prescription(s): acetaminophen, atenolol, cetirizine, ciclopirox, clobetasol cream, furosemide, gabapentin, hydroxychloroquine, hydroxyzine, lidocaine, methotrexate, mometasone, nitroglycerin, nortriptyline, pantoprazole, potassium chloride sa, prednisone, tramadol, and triamterene-hydrochlorothiazide. Her primarily concern today is the Back Pain (lower) and Leg Pain  (leg)  Initial Vital Signs:  Pulse/HCG Rate: 61  Temp: 97.9 F (36.6 C) Resp: 16 BP: (!) 144/102 SpO2: 96 %  BMI: Estimated body mass index is 33.84 kg/m as calculated from the following:   Height as of this encounter: 5\' 2"  (1.575 m).   Weight as of this encounter: 185 lb (83.9 kg).  Risk Assessment: Allergies: Reviewed. She is allergic to lisinopril.  Allergy Precautions: None required Coagulopathies: Reviewed. None identified.  Blood-thinner therapy: None at this time Active Infection(s): Reviewed. None identified. Maureen Hodge is afebrile  Site Confirmation: Maureen Hodge was asked to confirm the procedure and laterality before marking the site Procedure checklist: Completed Consent: Before the procedure and under the influence of no sedative(s), amnesic(s), or anxiolytics, the patient was informed of the treatment options, risks and possible complications. To fulfill our ethical and legal obligations, as recommended by the American Medical Association's Code of Ethics, I have informed the patient of my clinical impression; the nature and purpose of the treatment or procedure; the risks, benefits, and possible complications of the intervention; the alternatives, including doing nothing; the risk(s) and benefit(s) of the alternative treatment(s) or procedure(s); and the risk(s) and benefit(s) of doing nothing. The patient was provided information about the general risks and possible complications associated with the procedure. These may include, but are not limited to: failure to achieve desired goals, infection, bleeding, organ or nerve damage, allergic reactions, paralysis, and death. In addition, the patient was informed of those risks and complications associated to Spine-related procedures, such as failure to decrease pain; infection (i.e.: Meningitis, epidural or intraspinal abscess); bleeding (i.e.: epidural hematoma, subarachnoid hemorrhage, or any other type of intraspinal or  peri-dural bleeding); organ or nerve damage (i.e.: Any type of peripheral nerve, nerve root, or spinal cord injury) with subsequent damage to sensory, motor, and/or autonomic systems, resulting in permanent pain,  numbness, and/or weakness of one or several areas of the body; allergic reactions; (i.e.: anaphylactic reaction); and/or death. Furthermore, the patient was informed of those risks and complications associated with the medications. These include, but are not limited to: allergic reactions (i.e.: anaphylactic or anaphylactoid reaction(s)); adrenal axis suppression; blood sugar elevation that in diabetics may result in ketoacidosis or comma; water retention that in patients with history of congestive heart failure may result in shortness of breath, pulmonary edema, and decompensation with resultant heart failure; weight gain; swelling or edema; medication-induced neural toxicity; particulate matter embolism and blood vessel occlusion with resultant organ, and/or nervous system infarction; and/or aseptic necrosis of one or more joints. Finally, the patient was informed that Medicine is not an exact science; therefore, there is also the possibility of unforeseen or unpredictable risks and/or possible complications that may result in a catastrophic outcome. The patient indicated having understood very clearly. We have given the patient no guarantees and we have made no promises. Enough time was given to the patient to ask questions, all of which were answered to the patient's satisfaction. Maureen Hodge has indicated that she wanted to continue with the procedure. Attestation: I, the ordering provider, attest that I have discussed with the patient the benefits, risks, side-effects, alternatives, likelihood of achieving goals, and potential problems during recovery for the procedure that I have provided informed consent. Date  Time: 09/19/2017  7:43 AM  Pre-Procedure Preparation:  Monitoring: As per clinic  protocol. Respiration, ETCO2, SpO2, BP, heart rate and rhythm monitor placed and checked for adequate function Safety Precautions: Patient was assessed for positional comfort and pressure points before starting the procedure. Time-out: I initiated and conducted the "Time-out" before starting the procedure, as per protocol. The patient was asked to participate by confirming the accuracy of the "Time Out" information. Verification of the correct person, site, and procedure were performed and confirmed by me, the nursing staff, and the patient. "Time-out" conducted as per Joint Commission's Universal Protocol (UP.01.01.01). Time: 0815  Description of Procedure:          Position: Prone with head of the table was raised to facilitate breathing. Target Area: The interlaminar space, initially targeting the lower laminar border of the superior vertebral body. Approach: Paramedial approach. Area Prepped: Entire Posterior Lumbar Region Prepping solution: ChloraPrep (2% chlorhexidine gluconate and 70% isopropyl alcohol) Safety Precautions: Aspiration looking for blood return was conducted prior to all injections. At no point did we inject any substances, as a needle was being advanced. No attempts were made at seeking any paresthesias. Safe injection practices and needle disposal techniques used. Medications properly checked for expiration dates. SDV (single dose vial) medications used. Description of the Procedure: Protocol guidelines were followed. The procedure needle was introduced through the skin, ipsilateral to the reported pain, and advanced to the target area. Bone was contacted and the needle walked caudad, until the lamina was cleared. The epidural space was identified using "loss-of-resistance technique" with 2-3 ml of PF-NaCl (0.9% NSS), in a 5cc LOR glass syringe. Vitals:   09/19/17 0818 09/19/17 0823 09/19/17 0828 09/19/17 0832  BP: (!) 140/98 (!) 148/104 (!) 142/99 (!) 140/96  Pulse: 61 63 65  66  Resp: 20 13 16 17   Temp:      TempSrc:      SpO2: 97% 96% 97% 96%  Weight:      Height:        Start Time: 0815 hrs. End Time: 0827 hrs. Materials:  Needle(s) Type: Epidural needle  Gauge: 17G Length: 3.5-in Medication(s): Please see orders for medications and dosing details. 10 cc solution consisting of 7 cc of preservative-free saline, 1 cc of Decadron 10 mg/cc, 2 cc of 0.2% ropivacaine Imaging Guidance (Spinal):          Type of Imaging Technique: Fluoroscopy Guidance (Spinal) Indication(s): Assistance in needle guidance and placement for procedures requiring needle placement in or near specific anatomical locations not easily accessible without such assistance. Exposure Time: Please see nurses notes. Contrast: Before injecting any contrast, we confirmed that the patient did not have an allergy to iodine, shellfish, or radiological contrast. Once satisfactory needle placement was completed at the desired level, radiological contrast was injected. Contrast injected under live fluoroscopy. No contrast complications. See chart for type and volume of contrast used. Fluoroscopic Guidance: I was personally present during the use of fluoroscopy. "Tunnel Vision Technique" used to obtain the best possible view of the target area. Parallax error corrected before commencing the procedure. "Direction-depth-direction" technique used to introduce the needle under continuous pulsed fluoroscopy. Once target was reached, antero-posterior, oblique, and lateral fluoroscopic projection used confirm needle placement in all planes. Images permanently stored in EMR. Interpretation: I personally interpreted the imaging intraoperatively. Adequate needle placement confirmed in multiple planes. Appropriate spread of contrast into desired area was observed. No evidence of afferent or efferent intravascular uptake. No intrathecal or subarachnoid spread observed. Permanent images saved into the patient's  record.  Antibiotic Prophylaxis:   Anti-infectives (From admission, onward)   None     Indication(s): None identified  Post-operative Assessment:  Post-procedure Vital Signs:  Pulse/HCG Rate: 66  Temp: 97.9 F (36.6 C) Resp: 17 BP: (!) 140/96 SpO2: 96 %  EBL: None  Complications: No immediate post-treatment complications observed by team, or reported by patient.  Note: The patient tolerated the entire procedure well. A repeat set of vitals were taken after the procedure and the patient was kept under observation following institutional policy, for this type of procedure. Post-procedural neurological assessment was performed, showing return to baseline, prior to discharge. The patient was provided with post-procedure discharge instructions, including a section on how to identify potential problems. Should any problems arise concerning this procedure, the patient was given instructions to immediately contact us, at any time, without hesitation. In any case, we plan to contact the patient by telephone for a follow-up status report regarding this interventional procedure.  Comments:  No additional relevant information. 5 out of 5 strength bilateral lower extremity: Plantar flexion, dorsiflexion, knee flexion, knee extension.  Plan of Care    Imaging Orders     DG C-Arm 1-60 Min-No Report Procedure Orders    No procedure(s) ordered today    Medications ordered for procedure: Meds ordered this encounter  Medications  . iopamidol (ISOVUE-M) 41 % intrathecal injection 10 mL  . ropivacaine (PF) 2 mg/mL (0.2%) (NAROPIN) injection 2 mL  . sodium chloride flush (NS) 0.9 % injection 2 mL  . lidocaine (XYLOCAINE) 2 % (with pres) injection 200 mg  . dexamethasone (DECADRON) injection 10 mg   Medications administered: We administered iopamidol, ropivacaine (PF) 2 mg/mL (0.2%), sodium chloride flush, lidocaine, and dexamethasone.  See the medical record for exact dosing, route, and  time of administration.  New Prescriptions   No medications on file   Disposition: Discharge home  Discharge Date & Time: 09/19/2017; 0837 hrs.   Physician-requested Follow-up: Return in about 5 weeks (around 10/24/2017) for Post Procedure Evaluation.  Future Appointments  Date Time Provider [place]  10/25/2017  8:45 AM Gillis Santa, MD [facility] None   Primary Care Physician: Wardell Honour, MD Location: [facility] Note by: Gillis Santa, MD Date: 09/19/2017; Time: 9:09 AM  Disclaimer:  Medicine is not an exact science. The only guarantee in medicine is that nothing is guaranteed. It is important to note that the decision to proceed with this intervention was based on the information collected from the patient. The Data and conclusions were drawn from the patient's questionnaire, the interview, and the physical examination. Because the information was provided in large part by the patient, it cannot be guaranteed that it has not been purposely or unconsciously manipulated. Every effort has been made to obtain as much relevant data as possible for this evaluation. It is important to note that the conclusions that lead to this procedure are derived in large part from the available data. Always take into account that the treatment will also be dependent on availability of resources and existing treatment guidelines, considered by other Pain Management Practitioners as being common knowledge and practice, at the time of the intervention. For Medico-Legal purposes, it is also important to point out that variation in procedural techniques and pharmacological choices are the acceptable norm. The indications, contraindications, technique, and results of the above procedure should only be interpreted and judged by a Board-Certified Interventional Pain Specialist with extensive familiarity and expertise in the same exact procedure and technique.

## 2017-09-19 NOTE — Patient Instructions (Signed)

## 2017-09-20 ENCOUNTER — Telehealth: Payer: Self-pay | Admitting: *Deleted

## 2017-09-20 NOTE — Telephone Encounter (Signed)
Voicemail left with patient checking on her after procedure.  Asked to call our office if there questions or concerns.

## 2017-10-04 ENCOUNTER — Other Ambulatory Visit: Payer: Self-pay | Admitting: Family Medicine

## 2017-10-04 DIAGNOSIS — Z1231 Encounter for screening mammogram for malignant neoplasm of breast: Secondary | ICD-10-CM

## 2017-10-20 ENCOUNTER — Ambulatory Visit
Admission: RE | Admit: 2017-10-20 | Discharge: 2017-10-20 | Disposition: A | Discharge status: Home / Self Care | Payer: 59 | Source: Ambulatory Visit | Attending: Family Medicine | Admitting: Family Medicine

## 2017-10-20 DIAGNOSIS — Z1231 Encounter for screening mammogram for malignant neoplasm of breast: Secondary | ICD-10-CM | POA: Insufficient documentation

## 2017-10-25 ENCOUNTER — Encounter: Payer: Self-pay | Admitting: Student in an Organized Health Care Education/Training Program

## 2017-10-25 ENCOUNTER — Other Ambulatory Visit: Payer: Self-pay

## 2017-10-25 ENCOUNTER — Ambulatory Visit
Payer: 59 | Attending: Student in an Organized Health Care Education/Training Program | Admitting: Student in an Organized Health Care Education/Training Program

## 2017-10-25 VITALS — BP 138/93 | HR 66 | Temp 98.0°F | Resp 16 | Ht 62.0 in | Wt 185.0 lb

## 2017-10-25 DIAGNOSIS — E669 Obesity, unspecified: Secondary | ICD-10-CM | POA: Insufficient documentation

## 2017-10-25 DIAGNOSIS — M5416 Radiculopathy, lumbar region: Secondary | ICD-10-CM

## 2017-10-25 DIAGNOSIS — L930 Discoid lupus erythematosus: Secondary | ICD-10-CM | POA: Diagnosis not present

## 2017-10-25 DIAGNOSIS — Z7951 Long term (current) use of inhaled steroids: Secondary | ICD-10-CM | POA: Diagnosis not present

## 2017-10-25 DIAGNOSIS — Z6833 Body mass index (BMI) 33.0-33.9, adult: Secondary | ICD-10-CM | POA: Diagnosis not present

## 2017-10-25 DIAGNOSIS — M353 Polymyalgia rheumatica: Secondary | ICD-10-CM | POA: Diagnosis not present

## 2017-10-25 DIAGNOSIS — G894 Chronic pain syndrome: Secondary | ICD-10-CM | POA: Diagnosis not present

## 2017-10-25 DIAGNOSIS — K219 Gastro-esophageal reflux disease without esophagitis: Secondary | ICD-10-CM | POA: Diagnosis not present

## 2017-10-25 DIAGNOSIS — Z8249 Family history of ischemic heart disease and other diseases of the circulatory system: Secondary | ICD-10-CM | POA: Diagnosis not present

## 2017-10-25 DIAGNOSIS — M79662 Pain in left lower leg: Secondary | ICD-10-CM | POA: Diagnosis present

## 2017-10-25 DIAGNOSIS — D320 Benign neoplasm of cerebral meninges: Secondary | ICD-10-CM | POA: Diagnosis not present

## 2017-10-25 DIAGNOSIS — I10 Essential (primary) hypertension: Secondary | ICD-10-CM | POA: Diagnosis not present

## 2017-10-25 DIAGNOSIS — Z9889 Other specified postprocedural states: Secondary | ICD-10-CM | POA: Insufficient documentation

## 2017-10-25 DIAGNOSIS — Z79899 Other long term (current) drug therapy: Secondary | ICD-10-CM | POA: Diagnosis not present

## 2017-10-25 NOTE — Progress Notes (Signed)
Patient's Name: Maureen Hodge  MRN: [id number]  Referring Provider: Wardell Honour, MD  DOB: January 22, 1950  PCP: Maureen Honour, MD  DOS: 10/25/2017  Note by: Maureen Santa, MD  Service setting: Ambulatory outpatient  Specialty: Interventional Pain Management  Location: [facility] (AMB) Pain Management Facility    Patient type: Established   Primary Reason(s) for Visit: Encounter for post-procedure evaluation of chronic illness with mild to moderate exacerbation CC: Leg Pain (left calf)  HPI  Maureen Hodge is a 67 y.o. year old, female patient, who comes today for a post-procedure evaluation. She has HYPERTENSION, BENIGN; EDEMA; Hypokalemia; GERD (gastroesophageal reflux disease); Meningioma ([place]); Discoid lupus; Benign neoplasm of cerebral meninges ([place]); Benign cerebral hemangioma ([place]); Lumbar canal stenosis; OSA on CPAP; Neuritis or radiculitis due to rupture of lumbar intervertebral disc; Obesity; Benign neoplasm of ascending colon; Benign neoplasm of cecum; and Polymyalgia rheumatica syndrome (HCC) on their problem list. Her primarily concern today is the Leg Pain (left calf)  Pain Assessment: Location: Left Leg Radiating: denies Onset: More than a month ago Duration: Chronic pain Quality: Aching Severity: 8 /10 (subjective, self-reported pain score)  Note: Reported level is inconsistent with clinical observations. Clinically the patient looks like a 65/10 A 65/10 is viewed as "Moderate" and described as significantly interfering with activities of daily living (ADL). It becomes difficult to feed, bathe, get dressed, get on and off the toilet or to perform personal hygiene functions. Difficult to get in and out of bed or a chair without assistance. Very distracting. With effort, it can be ignored when deeply involved in activities. Information on the proper use of the pain scale provided to the patient today. When using our objective Pain Scale, levels between 6 and 10/10 are said to belong in an  emergency room, as it progressively worsens from a 6/10, described as severely limiting, requiring emergency care not usually available at an outpatient pain management facility. At a 6/10 level, communication becomes difficult and requires great effort. Assistance to reach the emergency department may be required. Facial flushing and profuse sweating along with potentially dangerous increases in heart rate and blood pressure will be evident. Effect on ADL: "I cant sit still"  Timing: Intermittent Modifying factors: denies BP: (!) 138/93  HR: 66  Maureen Hodge comes in today for post-procedure evaluation.  Further details on both, my assessment(s), as well as the proposed treatment plan, please see below.  Post-Procedure Assessment  09/19/2017 Procedure: Left L2-L3 ESI Pre-procedure pain score:  9/10 Post-procedure pain score: 0/10         Influential Factors: BMI: 33.84 kg/m Intra-procedural challenges: None observed.         Assessment challenges: None detected.              Reported side-effects: None.        Post-procedural adverse reactions or complications: None reported         Sedation: Please see nurses note. When no sedatives are used, the analgesic levels obtained are directly associated to the effectiveness of the local anesthetics. However, when sedation is provided, the level of analgesia obtained during the initial 1 hour following the intervention, is believed to be the result of a combination of factors. These factors may include, but are not limited to: 1. The effectiveness of the local anesthetics used. 2. The effects of the analgesic(s) and/or anxiolytic(s) used. 3. The degree of discomfort experienced by the patient at the time of the procedure. 4. The patients ability and reliability in  recalling and recording the events. 5. The presence and influence of possible secondary gains and/or psychosocial factors. Reported result: Relief experienced during the 1st hour after  the procedure: 100 % (Ultra-Short Term Relief)            Interpretative annotation: Clinically appropriate result. Analgesia during this period is likely to be Local Anesthetic and/or IV Sedative (Analgesic/Anxiolytic) related.          Effects of local anesthetic: The analgesic effects attained during this period are directly associated to the localized infiltration of local anesthetics and therefore cary significant diagnostic value as to the etiological location, or anatomical origin, of the pain. Expected duration of relief is directly dependent on the pharmacodynamics of the local anesthetic used. Long-acting (4-6 hours) anesthetics used.  Reported result: Relief during the next 4 to 6 hour after the procedure: 100 % (Short-Term Relief)            Interpretative annotation: Clinically appropriate result. Analgesia during this period is likely to be Local Anesthetic-related.          Long-term benefit: Defined as the period of time past the expected duration of local anesthetics (1 hour for short-acting and 4-6 hours for long-acting). With the possible exception of prolonged sympathetic blockade from the local anesthetics, benefits during this period are typically attributed to, or associated with, other factors such as analgesic sensory neuropraxia, antiinflammatory effects, or beneficial biochemical changes provided by agents other than the local anesthetics.  Reported result: Extended relief following procedure: 15 %(position has changed from side of calf to front of calf) (Long-Term Relief)            Interpretative annotation: Clinically possible results. Good relief. No permanent benefit expected. Inflammation plays a part in the etiology to the pain.          Current benefits: Defined as reported results that persistent at this point in time.   Analgesia: <25 %            Function: Back to baseline ROM: Back to baseline Interpretative annotation: Recurrence of symptoms. No permanent  benefit expected. Results would suggest further treatment needed.          Interpretation: Results would suggest that repeating the procedure may be necessary,                  Plan:  Repeat treatment or therapy and compare extent and duration of benefits.                Laboratory Chemistry  Inflammation Markers (CRP: Acute Phase) (ESR: Chronic Phase) No results found for: CRP, ESRSEDRATE, LATICACIDVEN                       Rheumatology Markers No results found for: RF, ANA, LABURIC, URICUR, LYMEIGGIGMAB, LYMEABIGMQN, HLAB27                      Renal Function Markers Lab Results  Component Value Date   BUN 9 06/15/2017   CREATININE 0.87 06/15/2017   BCR 10 (L) 06/15/2017   GFRAA 81 06/15/2017   GFRNONAA 71 06/15/2017                             Hepatic Function Markers Lab Results  Component Value Date   AST 24 06/15/2017   ALT 14 06/15/2017   ALBUMIN 3.9 06/15/2017   ALKPHOS 89 06/15/2017  HCVAB NEGATIVE 11/15/2014   LIPASE 24 12/16/2016                        Electrolytes Lab Results  Component Value Date   NA 144 06/15/2017   K 3.3 (L) 06/15/2017   CL 100 06/15/2017   CALCIUM 9.3 06/15/2017   MG 1.5 11/05/2015                        Neuropathy Markers Lab Results  Component Value Date   VITAMINB12 471 01/24/2012   HGBA1C 6.2 (H) 02/09/2017   HIV NONREACTIVE 11/15/2014                        CNS Tests No results found for: COLORCSF, APPEARCSF, RBCCOUNTCSF, WBCCSF, POLYSCSF, LYMPHSCSF, EOSCSF, PROTEINCSF, GLUCCSF, JCVIRUS, CSFOLI, IGGCSF                      Bone Pathology Markers Lab Results  Component Value Date   VD25OH 43 01/24/2012                         Coagulation Parameters Lab Results  Component Value Date   PLT 268 06/15/2017                        Cardiovascular Markers Lab Results  Component Value Date   BNP 15.4 07/22/2015   TROPONINI <0.03 12/16/2016   HGB 12.3 06/15/2017   HCT 36.4 06/15/2017                         CA  Markers No results found for: CEA, CA125, LABCA2                      Note: Lab results reviewed.  Recent Diagnostic Imaging Results  MM 3D SCREEN BREAST BILATERAL CLINICAL DATA:  Screening.  EXAM: DIGITAL SCREENING BILATERAL MAMMOGRAM WITH TOMO AND CAD  COMPARISON:  Previous exam(s).  ACR Breast Density Category c: The breast tissue is heterogeneously dense, which may obscure small masses.  FINDINGS: In the left breast, a possible mass warrants further evaluation. In the right breast, no findings suspicious for malignancy.  Images were processed with CAD.  IMPRESSION: Further evaluation is suggested for possible mass in the left breast.  RECOMMENDATION: Diagnostic mammogram and possibly ultrasound of the left breast. (Code:[id number]-L-37M)  The patient will be contacted regarding the findings, and additional imaging will be scheduled.  BI-RADS CATEGORY  0: Incomplete. Need additional imaging evaluation and/or prior mammograms for comparison.  Electronically Signed   By: Marin Olp M.D.   On: 10/20/2017 15:02  Complexity Note: Imaging results reviewed. Results shared with Ms. Lunt, using Layman's terms.                         Meds   Current Outpatient Medications:  .  acetaminophen (TYLENOL) 500 MG tablet, Take 1,000 mg by mouth every 8 (eight) hours as needed., Disp: , Rfl:  .  atenolol (TENORMIN) 50 MG tablet, Take 1 tablet (50 mg total) by mouth 2 (two) times daily., Disp: 180 tablet, Rfl: 3 .  cetirizine (ZYRTEC) 10 MG tablet, TAKE 1 TABLET (10 MG TOTAL) BY MOUTH DAILY., Disp: 90 tablet, Rfl: 3 .  Ciclopirox 1 % shampoo, LATHER TO SCALP 1-2  TIMES A WEEK. ALLOW TO SIT 5 MINUTES BEFORE RINSING., Disp: , Rfl: 1 .  clobetasol cream (TEMOVATE) 0.05 %, APPLY A THIN LAYER TO THE AFFECTED AREAS TWICE DAILY AS NEEDED FOR A MAX OF 4 WEEKS PER FLARE, Disp: , Rfl: 1 .  furosemide (LASIX) 40 MG tablet, Take 1 tablet (40 mg total) by mouth daily. (Patient taking differently:  Take 40 mg by mouth daily as needed. ), Disp: 90 tablet, Rfl: 1 .  gabapentin (NEURONTIN) 800 MG tablet, TAKE 1 TABLET (800 MG TOTAL) BY MOUTH 4 (FOUR) TIMES DAILY., Disp: 360 tablet, Rfl: 3 .  hydroxychloroquine (PLAQUENIL) 200 MG tablet, Take 200 mg by mouth 2 (two) times daily.  , Disp: , Rfl:  .  hydrOXYzine (ATARAX/VISTARIL) 25 MG tablet, Take 0.5-1 tablets (12.5-25 mg total) by mouth at bedtime as needed (insomnia)., Disp: 30 tablet, Rfl: 2 .  lidocaine (LIDODERM) 5 %, Place 3 patches onto the skin daily. Remove & Discard patch within 12 hours or as directed by MD, Disp: 90 patch, Rfl: 1 .  methotrexate 2.5 MG tablet, Take by mouth once a week., Disp: , Rfl:  .  mometasone (ELOCON) 0.1 % ointment, Apply topically Twice daily as needed., Disp: , Rfl:  .  nitroGLYCERIN (NITROSTAT) 0.4 MG SL tablet, Place 1 tablet (0.4 mg total) under the tongue every 5 (five) minutes as needed for chest pain., Disp: 30 tablet, Rfl: 0 .  nortriptyline (PAMELOR) 25 MG capsule, Take 1 capsule (25 mg total) by mouth 3 (three) times daily., Disp: 270 capsule, Rfl: 3 .  pantoprazole (PROTONIX) 40 MG tablet, Take 1 tablet (40 mg total) by mouth daily., Disp: 90 tablet, Rfl: 3 .  potassium chloride SA (KLOR-CON M20) 20 MEQ tablet, Take 20 mEq by mouth daily., Disp: , Rfl:  .  predniSONE (DELTASONE) 5 MG tablet, Take 5 mg 1 tab daily, 30 days, Disp: , Rfl:  .  traMADol (ULTRAM) 50 MG tablet, Take 1 tablet (50 mg total) by mouth every 6 (six) hours as needed., Disp: 120 tablet, Rfl: 2 .  triamterene-hydrochlorothiazide (MAXZIDE-25) 37.5-25 MG tablet, Take 1 tablet by mouth daily., Disp: 90 tablet, Rfl: 3  ROS  Constitutional: Denies any fever or chills Gastrointestinal: No reported hemesis, hematochezia, vomiting, or acute GI distress Musculoskeletal: Denies any acute onset joint swelling, redness, loss of ROM, or weakness Neurological: No reported episodes of acute onset apraxia, aphasia, dysarthria, agnosia, amnesia,  paralysis, loss of coordination, or loss of consciousness  Allergies  Ms. Flamenco is allergic to lisinopril.  [place]  Drug: Ms. Godden  reports that she does not use drugs. Alcohol:  reports that she does not drink alcohol. Tobacco:  reports that she has never smoked. She has never used smokeless tobacco. Medical:  has a past medical history of Allergy, Arthritis, Brain tumor ([place]) (05/2011), Cerebral aneurysm, nonruptured, Diabetes mellitus, Diffuse cystic mastopathy, Esophageal reflux, Hypertension, Hypopotassemia (06/01/2007), Intervertebral disc disorder with myelopathy, unspecified region, Lupus erythematosus, Migraines, Other convulsions, PLMD (periodic limb movement disorder), Presence of dental bridge, S/P bunionectomy (09/28/2013), and Unspecified sleep apnea. Surgical: Ms. Aldama  has a past surgical history that includes Dilation and curettage of uterus; Hemorrhoid surgery; Breast surgery; Foot surgery; Brain surgery (2013); lumbar surgery (2000); Brain AVM surgery; Tonsillectomy and adenoidectomy; Spine surgery; Hernia repair; Total abdominal hysterectomy (1979); Colonoscopy with propofol (N/A, 07/12/2016); polypectomy (07/12/2016); Breast biopsy (Bilateral); and Breast cyst aspiration (Bilateral). Family: family history includes Arthritis in her sister; Breast cancer in her paternal aunt; Cancer in her  other; Cancer (age of onset: 77) in her mother; Coronary artery disease in her other; Crohn's disease in her sister; Diabetes in her daughter; Heart disease in her mother; Heart failure in her mother; Hypertension in her mother and sister; Stroke in her mother.  Constitutional Exam  General appearance: Well nourished, well developed, and well hydrated. In no apparent acute distress Vitals:   10/25/17 0855  BP: (!) 138/93  Pulse: 66  Resp: 16  Temp: 98 F (36.7 C)  SpO2: 100%  Weight: 185 lb (83.9 kg)  Height: _0  (1.575 m)   BMI Assessment: Estimated body mass index is 33.84 kg/m as  calculated from the following:   Height as of this encounter: _1  (1.575 m).   Weight as of this encounter: 185 lb (83.9 kg).  BMI interpretation table: BMI level Category Range association with higher incidence of chronic pain  <18 kg/m2 Underweight   18.5-24.9 kg/m2 Ideal body weight   25-29.9 kg/m2 Overweight Increased incidence by 20%  30-34.9 kg/m2 Obese (Class I) Increased incidence by 68%  35-39.9 kg/m2 Severe obesity (Class II) Increased incidence by 136%  >40 kg/m2 Extreme obesity (Class III) Increased incidence by 254%   Patient's current BMI Ideal Body weight  Body mass index is 33.84 kg/m. Ideal body weight: 50.1 kg (110 lb 7.2 oz) Adjusted ideal body weight: 63.6 kg (140 lb 4.3 oz)   BMI Readings from Last 4 Encounters:  10/25/17 33.84 kg/m  09/19/17 33.84 kg/m  09/12/17 35.52 kg/m  08/15/17 34.96 kg/m   Wt Readings from Last 4 Encounters:  10/25/17 185 lb (83.9 kg)  09/19/17 185 lb (83.9 kg)  09/12/17 188 lb (85.3 kg)  08/15/17 185 lb (83.9 kg)  Psych/Mental status: Alert, oriented x 3 (person, place, & time)       Eyes: PERLA Respiratory: No evidence of acute respiratory distress  Cervical Spine Area Exam  Skin & Axial Inspection: No masses, redness, edema, swelling, or associated skin lesions Alignment: Symmetrical Functional ROM: Unrestricted ROM      Stability: No instability detected Muscle Tone/Strength: Functionally intact. No obvious neuro-muscular anomalies detected. Sensory (Neurological): Unimpaired Palpation: No palpable anomalies              Upper Extremity (UE) Exam    Side: Right upper extremity  Side: Left upper extremity  Skin & Extremity Inspection: Skin color, temperature, and hair growth are WNL. No peripheral edema or cyanosis. No masses, redness, swelling, asymmetry, or associated skin lesions. No contractures.  Skin & Extremity Inspection: Skin color, temperature, and hair growth are WNL. No peripheral edema or cyanosis. No  masses, redness, swelling, asymmetry, or associated skin lesions. No contractures.  Functional ROM: Unrestricted ROM          Functional ROM: Unrestricted ROM          Muscle Tone/Strength: Functionally intact. No obvious neuro-muscular anomalies detected.  Muscle Tone/Strength: Functionally intact. No obvious neuro-muscular anomalies detected.  Sensory (Neurological): Unimpaired          Sensory (Neurological): Unimpaired          Palpation: No palpable anomalies              Palpation: No palpable anomalies              Provocative Test(s):  Phalen's test: deferred Tinel's test: deferred Apley's scratch test (touch opposite shoulder):  Action 1 (Across chest): deferred Action 2 (Overhead): deferred Action 3 (LB reach): deferred   Provocative Test(s):  Phalen's test:  deferred Tinel's test: deferred Apley's scratch test (touch opposite shoulder):  Action 1 (Across chest): deferred Action 2 (Overhead): deferred Action 3 (LB reach): deferred    Thoracic Spine Area Exam  Skin & Axial Inspection: No masses, redness, or swelling Alignment: Symmetrical Functional ROM: Unrestricted ROM Stability: No instability detected Muscle Tone/Strength: Functionally intact. No obvious neuro-muscular anomalies detected. Sensory (Neurological): Unimpaired Muscle strength & Tone: No palpable anomalies  Lumbar Spine Area Exam  Skin & Axial Inspection: Well healed scar from previous spine surgery detected Alignment: Symmetrical Functional ROM: Decreased ROM       Stability: No instability detected Muscle Tone/Strength: Functionally intact. No obvious neuro-muscular anomalies detected. Sensory (Neurological): Musculoskeletal pain pattern and dermatomal Palpation: No palpable anomalies       Provocative Tests: Hyperextension/rotation test: deferred today       Lumbar quadrant test (Kemp's test): (+) bilateral for foraminal stenosis Lateral bending test: (+) ipsilateral radicular pain, bilaterally.  Positive for bilateral foraminal stenosis. Patrick's Maneuver: deferred today                   FABER test: deferred today                   S-I anterior distraction/compression test: deferred today         S-I lateral compression test: deferred today         S-I Thigh-thrust test: deferred today         S-I Gaenslen's test: deferred today          Gait & Posture Assessment  Ambulation: Unassisted Gait: Relatively normal for age and body habitus Posture: WNL   Lower Extremity Exam    Side: Right lower extremity  Side: Left lower extremity  Stability: No instability observed          Stability: No instability observed          Skin & Extremity Inspection: Skin color, temperature, and hair growth are WNL. No peripheral edema or cyanosis. No masses, redness, swelling, asymmetry, or associated skin lesions. No contractures.  Skin & Extremity Inspection: Skin color, temperature, and hair growth are WNL. No peripheral edema or cyanosis. No masses, redness, swelling, asymmetry, or associated skin lesions. No contractures.  Functional ROM: Unrestricted ROM                  Functional ROM: Unrestricted ROM                  Muscle Tone/Strength: Functionally intact. No obvious neuro-muscular anomalies detected.  Muscle Tone/Strength: Functionally intact. No obvious neuro-muscular anomalies detected.  Sensory (Neurological): Unimpaired  Sensory (Neurological): Unimpaired  Palpation: No palpable anomalies  Palpation: No palpable anomalies   Assessment  Primary Diagnosis & Pertinent Problem List: The primary encounter diagnosis was Lumbar radiculopathy. Diagnoses of Hx of lumbosacral spine surgery (Left L5/S1 foraminotomy) and Chronic pain syndrome were also pertinent to this visit.  Status Diagnosis  Responding Persistent Persistent 1. Lumbar radiculopathy   2. Hx of lumbosacral spine surgery (Left L5/S1 foraminotomy)   3. Chronic pain syndrome     General Recommendations: The pain condition  that the patient suffers from is best treated with a multidisciplinary approach that involves an increase in physical activity to prevent de-conditioning and worsening of the pain cycle, as well as psychological counseling (formal and/or informal) to address the co-morbid psychological affects of pain. Treatment will often involve judicious use of pain medications and interventional procedures to decrease the pain, allowing the  patient to participate in the physical activity that will ultimately produce long-lasting pain reductions. The goal of the multidisciplinary approach is to return the patient to a higher level of overall function and to restore their ability to perform activities of daily living.  67 year old female with a history of left hip and left leg pain that starts primarily at her left distal thigh and extends down to her left ankle in a dermatomal fashion. Patient is status post greater trochanteric bursa injections on 05/13/2017 with Dr. Candelaria Stagers which were not effective for her hip pain. Patient does have a history of shingles last year. She has had 2 previous lumbar spine surgeries including history of L5-S1 foraminotomy as well as left L5-S1 laminectomy.  Patient follows up today status post left L2-3 ESI which he states was not as effective as her first epidural steroid injection performed at left L3-L4.  Previous injection was performed at L2-L3 given pain endorsement along her proximal anterior medial thigh contributing to the L2 and L3 dermatome.  We discussed repeating lumbar epidural steroid injection #3 at the previous level left L3/L4.  Risks and benefits were discussed and patient would like to proceed.  Plan: -Left L3/L4 lumbar ESI #3 with sedation under fluoroscopy  Lab-work, procedure(s), and/or referral(s): Orders Placed This Encounter  Procedures  . Lumbar Epidural Injection   Time Note: Greater than 50% of the 25 minute(s) of face-to-face time spent with Ms.  Partin, was spent in counseling/coordination of care regarding: Ms. Mims primary cause of pain, the treatment plan, treatment alternatives, the risks and possible complications of proposed treatment, going over the informed consent, the results, interpretation and significance of  her recent diagnostic interventional treatment(s) and realistic expectations.  Provider-requested follow-up: Return in about 2 weeks (around 11/08/2017) for Procedure.  No future appointments.  Primary Care Physician: Maureen Honour, MD Location: [facility] Note by: Maureen Hodge, M.D Date: 10/25/2017; Time: 11:24 AM  Patient Instructions   Preparing for your procedure (without sedation) Instructions: . Oral Intake: Do not eat or drink anything for at least 3 hours prior to your procedure. . Transportation: Unless otherwise stated by your physician, you may drive yourself after the procedure. . Blood Pressure Medicine: Take your blood pressure medicine with a sip of water the morning of the procedure. . Insulin: Take only  of your normal insulin dose. . Preventing infections: Shower with an antibacterial soap the morning of your procedure. . Build-up your immune system: Take 1000 mg of Vitamin C with every meal (3 times a day) the day prior to your procedure. . Pregnancy: If you are pregnant, call and cancel the procedure. . Sickness: If you have a cold, fever, or any active infections, call and cancel the procedure. . Arrival: You must be in the facility at least 30 minutes prior to your scheduled procedure. . Children: Do not bring any children with you. . Dress appropriately: Bring dark clothing that you would not mind if they get stained. . Valuables: Do not bring any jewelry or valuables. Procedure appointments are reserved for interventional treatments only. [name] No Prescription Refills. . No medication changes will be discussed during procedure appointments. No disability  issues will be discussed. Epidural Steroid Injection An epidural steroid injection is given to relieve pain in your neck, back, or legs that is caused by the irritation or swelling of a nerve root. This procedure involves injecting a steroid and numbing medicine (anesthetic) into the epidural space. The epidural space is the  space between the outer covering of your spinal cord and the bones that form your backbone (vertebra).  LET [facility] CARE PROVIDER KNOW ABOUT:  Any allergies you have. All medicines you are taking, including vitamins, herbs, eye drops, creams, and over-the-counter medicines such as aspirin. Previous problems you or members of your family have had with the use of anesthetics. Any blood disorders or blood clotting disorders you have. Previous surgeries you have had. Medical conditions you have.  RISKS AND COMPLICATIONS Generally, this is a safe procedure. However, as with any procedure, complications can occur. Possible complications of epidural steroid injection include: Headache. Bleeding. Infection. Allergic reaction to the medicines. Damage to your nerves. The response to this procedure depends on the underlying cause of the pain and its duration. People who have long-term (chronic) pain are less likely to benefit from epidural steroids than are those people whose pain comes on strong and suddenly.  BEFORE THE PROCEDURE  Ask your health care provider about changing or stopping your regular medicines. You may be advised to stop taking blood-thinning medicines a few days before the procedure. You may be given medicines to reduce anxiety. Arrange for someone to take you home after the procedure.  PROCEDURE  You will remain awake during the procedure. You may receive medicine to make you relaxed. You will be asked to lie on your stomach. The injection site will be cleaned. The injection site will be numbed with a medicine (local anesthetic). A needle will be  injected through your skin into the epidural space. Your health care provider will use an X-ray machine to ensure that the steroid is delivered closest to the affected nerve. You may have minimal discomfort at this time. Once the needle is in the right position, the local anesthetic and the steroid will be injected into the epidural space. The needle will then be removed and a bandage will be applied to the injection site.  AFTER THE PROCEDURE  You may be monitored for a short time before you go home. You may feel weakness or numbness in your arm or leg, which disappears within hours. You may be allowed to eat, drink, and take your regular medicine. You may have soreness at the site of the injection.   This information is not intended to replace advice given to you by your health care provider. Make sure you discuss any questions you have with your health care provider.   Document Released: 03/30/2007 Document Revised: 08/23/2012 Document Reviewed: 06/09/2012 Elsevier Interactive Patient Education 2016 [place]  What are the risk, side effects and possible complications? Generally speaking, most procedures are safe.  However, with any procedure there are risks, side effects, and the possibility of complications.  The risks and complications are dependent upon the sites that are lesioned, or the type of nerve block to be performed.  The closer the procedure is to the spine, the more serious the risks are.  Great care is taken when placing the radio frequency needles, block needles or lesioning probes, but sometimes complications can occur. Infection: Any time there is an injection through the skin, there is a risk of infection.  This is why sterile conditions are used for these blocks. There are four possible types of infection: 1. Localized skin infection. 2. Central Nervous System Infection: This can be in the form of Meningitis, which can be deadly. 3.  Epidural Infections: This can be in the form of an epidural abscess, which can cause  pressure inside of the spine, causing compression of the spinal cord with subsequent paralysis. This would require an emergency surgery to decompress, and there are no guarantees that the patient would recover from the paralysis. 4. Discitis: This is an infection of the intervertebral discs. It occurs in about 1% of discography procedures. It is difficult to treat and it may lead to surgery. Pain: the needles have to go through skin and soft tissues, will cause soreness. Damage to internal structures:  The nerves to be lesioned may be near blood vessels or other nerves which can be potentially damaged. Bleeding: Bleeding is more common if the patient is taking blood thinners such as  aspirin, Coumadin, Ticiid, Plavix, etc., or if he/she have some genetic predisposition such as hemophilia. Bleeding into the spinal canal can cause compression of the spinal  cord with subsequent paralysis.  This would require an emergency surgery to decompress and there are no guarantees that the patient would recover from the paralysis. Pneumothorax: Puncturing of a lung is a possibility, every time a needle is introduced in the area of the chest or upper back.  Pneumothorax refers to free air around the collapsed lung(s), inside of the thoracic cavity (chest cavity).  Another two possible complications related to a similar event would include: Hemothorax and Chylothorax. These are variations of the Pneumothorax, where instead of air around the collapsed lung(s), you may have blood or chyle, respectively. Spinal headaches: They may occur with any procedures in the area of the spine. Persistent CSF (Cerebro-Spinal Fluid) leakage: This is a rare problem, but may occur with prolonged intrathecal or epidural catheters either due to the formation of a fistulous track or a dural tear. Nerve damage: By working so close to the spinal cord, there is  always a possibility of nerve damage, which could be as serious as a permanent spinal cord injury with paralysis. Death: Although rare, severe deadly allergic reactions known as "Anaphylactic reaction" can occur to any of the medications used. Worsening of the symptoms: We can always make thing worse.  What are the chances of something like this happening? Chances of any of this occuring are extremely low.  By statistics, you have more of a chance of getting killed in a motor vehicle accident: while driving to the hospital than any of the above occurring .  Nevertheless, you should be aware that they are possibilities.  In general, it is similar to taking a shower.  Everybody knows that you can slip, hit your head and get killed.  Does that mean that you should not shower again?  Nevertheless always keep in mind that statistics do not mean anything if you happen to be on the wrong side of them.  Even if a procedure has a 1 (one) in a 1,000,000 (million) chance of going wrong, it you happen to be that one..Also, keep in mind that by statistics, you have more of a chance of having something go wrong when taking medications.  Who should not have this procedure? If you are on a blood thinning medication (e.g. Coumadin, Plavix, see list of "Blood Thinners"), or if you have an active infection going on, you should not have the procedure.  If you are taking any blood thinners, please inform your physician.  Preparing for your procedure: Do not eat or drink anything at least eight (8) hours prior to the procedure. Bring a driver with you .  It cannot be a taxi. Come accompanied by an adult that can drive  you back, and that is strong enough to help you if your legs get weak or numb from the local anesthetic. Take all of your medicines the morning of the procedure with just enough water to swallow them. If you have diabetes, make sure that you are scheduled to have your procedure done first thing in the  morning, whenever possible. If you have diabetes, take only half of your insulin dose and notify our nurse that you have done so as soon as you arrive at the clinic. If you are diabetic, but only take blood sugar pills (oral hypoglycemic), then do not take them on the morning of your procedure.  You may take them after you have had the procedure. Do not take aspirin or any aspirin-containing medications, at least eleven (11) days prior to the procedure.  They may prolong bleeding. Wear loose fitting clothing that may be easy to take off and that you would not mind if it got stained with Betadine or blood. Do not wear any jewelry or perfume Remove any nail coloring.  It will interfere with some of our monitoring equipment. If you take Metformin for your diabetes, stop it 48 hours prior to the procedure.  NOTE: Remember that this is not meant to be interpreted as a complete list of all possible complications.  Unforeseen problems may occur.  BLOOD THINNERS The following drugs contain aspirin or other products, which can cause increased bleeding during surgery and should not be taken for 2 weeks prior to and 1 week after surgery.  If you should need take something for relief of minor pain, you may take acetaminophen which is found in Tylenol,m Datril, Anacin-3 and Panadol. It is not blood thinner. The products listed below are.  Do not take any of the products listed below in addition to any listed on your instruction sheet.  A.P.C or A.P.C with Codeine Codeine Phosphate Capsules #3 Ibuprofen Ridaura  ABC compound Congesprin Imuran rimadil  Advil Cope Indocin Robaxisal  Alka-Seltzer Effervescent Pain Reliever and Antacid Coricidin or Coricidin-D  Indomethacin Rufen  Alka-Seltzer plus Cold Medicine Cosprin Ketoprofen S-A-C Tablets  Anacin Analgesic Tablets or Capsules Coumadin Korlgesic Salflex  Anacin Extra Strength Analgesic tablets or capsules CP-2 Tablets Lanoril Salicylate  Anaprox Cuprimine  Capsules Levenox Salocol  Anexsia-D Dalteparin Magan Salsalate  Anodynos Darvon compound Magnesium Salicylate Sine-off  Ansaid Dasin Capsules Magsal Sodium Salicylate  Anturane Depen Capsules Marnal Soma  APF Arthritis pain formula Dewitt's Pills Measurin Stanback  Argesic Dia-Gesic Meclofenamic Sulfinpyrazone  Arthritis Bayer Timed Release Aspirin Diclofenac Meclomen Sulindac  Arthritis pain formula Anacin Dicumarol Medipren Supac  Analgesic (Safety coated) Arthralgen Diffunasal Mefanamic Suprofen  Arthritis Strength Bufferin Dihydrocodeine Mepro Compound Suprol  Arthropan liquid Dopirydamole Methcarbomol with Aspirin Synalgos  ASA tablets/Enseals Disalcid Micrainin Tagament  Ascriptin Doan's Midol Talwin  Ascriptin A/D Dolene Mobidin Tanderil  Ascriptin Extra Strength Dolobid Moblgesic Ticlid  Ascriptin with Codeine Doloprin or Doloprin with Codeine Momentum Tolectin  Asperbuf Duoprin Mono-gesic Trendar  Aspergum Duradyne Motrin or Motrin IB Triminicin  Aspirin plain, buffered or enteric coated Durasal Myochrisine Trigesic  Aspirin Suppositories Easprin Nalfon Trillsate  Aspirin with Codeine Ecotrin Regular or Extra Strength Naprosyn Uracel  Atromid-S Efficin Naproxen Ursinus  Auranofin Capsules Elmiron Neocylate Vanquish  Axotal Emagrin Norgesic Verin  Azathioprine Empirin or Empirin with Codeine Normiflo Vitamin E  Azolid Emprazil Nuprin Voltaren  Bayer Aspirin plain, buffered or children's or timed BC Tablets or powders Encaprin Orgaran Warfarin Sodium  Buff-a-Comp Enoxaparin Orudis Zorpin  Buff-a-Comp  with Codeine Equegesic Os-Cal-Gesic   Buffaprin Excedrin plain, buffered or Extra Strength Oxalid   Bufferin Arthritis Strength Feldene Oxphenbutazone   Bufferin plain or Extra Strength Feldene Capsules Oxycodone with Aspirin   Bufferin with Codeine Fenoprofen Fenoprofen Pabalate or Pabalate-SF   Buffets II Flogesic Panagesic   Buffinol plain or Extra Strength Florinal or  Florinal with Codeine Panwarfarin   Buf-Tabs Flurbiprofen Penicillamine   Butalbital Compound Four-way cold tablets Penicillin   Butazolidin Fragmin Pepto-Bismol   Carbenicillin Geminisyn Percodan   Carna Arthritis Reliever Geopen Persantine   Carprofen Gold's salt Persistin   Chloramphenicol Goody's Phenylbutazone   Chloromycetin Haltrain Piroxlcam   Clmetidine heparin Plaquenil   Cllnoril Hyco-pap Ponstel   Clofibrate Hydroxy chloroquine Propoxyphen         . Before stopping any of these medications, be sure to consult the physician who ordered them.  Some, such as Coumadin (Warfarin) are ordered to prevent or treat serious conditions such as "deep thrombosis", "pumonary embolisms", and other heart problems.  The amount of time that you may need off of the medication may also vary with the medication and the reason for which you were taking it.  If you are taking any of these medications, please make sure you notify your pain physician before you undergo any procedures.

## 2017-10-25 NOTE — Progress Notes (Signed)
Safety precautions to be maintained throughout the outpatient stay will include: orient to surroundings, keep bed in low position, maintain call bell within reach at all times, provide assistance with transfer out of bed and ambulation.  

## 2017-10-25 NOTE — Patient Instructions (Signed)
Preparing for your procedure (without sedation) Instructions: Oral Intake: Do not eat or drink anything for at least 3 hours prior to your procedure. Transportation: Unless otherwise stated by your physician, you may drive yourself after the procedure. Blood Pressure Medicine: Take your blood pressure medicine with a sip of water the morning of the procedure. Insulin: Take only  of your normal insulin dose. Preventing infections: Shower with an antibacterial soap the morning of your procedure. Build-up your immune system: Take 1000 mg of Vitamin C with every meal (3 times a day) the day prior to your procedure. Pregnancy: If you are pregnant, call and cancel the procedure. Sickness: If you have a cold, fever, or any active infections, call and cancel the procedure. Arrival: You must be in the facility at least 30 minutes prior to your scheduled procedure. Children: Do not bring any children with you. Dress appropriately: Bring dark clothing that you would not mind if they get stained. Valuables: Do not bring any jewelry or valuables. Procedure appointments are reserved for interventional treatments only. No Prescription Refills. No medication changes will be discussed during procedure appointments. No disability issues will be discussed. ____________________________________________________________________________________________  Epidural Steroid Injection  An epidural steroid injection is given to relieve pain in your neck, back, or legs that is caused by the irritation or swelling of a nerve root. This procedure involves injecting a steroid and numbing medicine (anesthetic) into the epidural space. The epidural space is the space between the outer covering of your spinal cord and the bones that form your backbone (vertebra).  LET YOUR HEALTH CARE PROVIDER KNOW ABOUT:  Any allergies you have. All medicines you are taking, including vitamins, herbs, eye drops, creams, and over-the-counter  medicines such as aspirin. Previous problems you or members of your family have had with the use of anesthetics. Any blood disorders or blood clotting disorders you have. Previous surgeries you have had. Medical conditions you have.  RISKS AND COMPLICATIONS Generally, this is a safe procedure. However, as with any procedure, complications can occur. Possible complications of epidural steroid injection include: Headache. Bleeding. Infection. Allergic reaction to the medicines. Damage to your nerves. The response to this procedure depends on the underlying cause of the pain and its duration. People who have long-term (chronic) pain are less likely to benefit from epidural steroids than are those people whose pain comes on strong and suddenly.  BEFORE THE PROCEDURE  Ask your health care provider about changing or stopping your regular medicines. You may be advised to stop taking blood-thinning medicines a few days before the procedure. You may be given medicines to reduce anxiety. Arrange for someone to take you home after the procedure.  PROCEDURE  You will remain awake during the procedure. You may receive medicine to make you relaxed. You will be asked to lie on your stomach. The injection site will be cleaned. The injection site will be numbed with a medicine (local anesthetic). A needle will be injected through your skin into the epidural space. Your health care provider will use an X-ray machine to ensure that the steroid is delivered closest to the affected nerve. You may have minimal discomfort at this time. Once the needle is in the right position, the local anesthetic and the steroid will be injected into the epidural space. The needle will then be removed and a bandage will be applied to the injection site.  AFTER THE PROCEDURE  You may be monitored for a short time before you go home. You may   feel weakness or numbness in your arm or leg, which disappears within hours. You  may be allowed to eat, drink, and take your regular medicine. You may have soreness at the site of the injection.   This information is not intended to replace advice given to you by your health care provider. Make sure you discuss any questions you have with your health care provider.   Document Released: 03/30/2007 Document Revised: 08/23/2012 Document Reviewed: 06/09/2012 [employer] Interactive Patient Education 2016 [employer]  GENERAL RISKS AND COMPLICATIONS  What are the risk, side effects and possible complications? Generally speaking, most procedures are safe.  However, with any procedure there are risks, side effects, and the possibility of complications.  The risks and complications are dependent upon the sites that are lesioned, or the type of nerve block to be performed.  The closer the procedure is to the spine, the more serious the risks are.  Great care is taken when placing the radio frequency needles, block needles or lesioning probes, but sometimes complications can occur. Infection: Any time there is an injection through the skin, there is a risk of infection.  This is why sterile conditions are used for these blocks. There are four possible types of infection: 1. Localized skin infection. 2. Central Nervous System Infection: This can be in the form of Meningitis, which can be deadly. 3. Epidural Infections: This can be in the form of an epidural abscess, which can cause pressure inside of the spine, causing compression of the spinal cord with subsequent paralysis. This would require an emergency surgery to decompress, and there are no guarantees that the patient would recover from the paralysis. 4. Discitis: This is an infection of the intervertebral discs. It occurs in about 1% of discography procedures. It is difficult to treat and it may lead to surgery. Pain: the needles have to go through skin and soft tissues, will cause soreness. Damage to internal structures:  The  nerves to be lesioned may be near blood vessels or other nerves which can be potentially damaged. Bleeding: Bleeding is more common if the patient is taking blood thinners such as  aspirin, Coumadin, Ticiid, Plavix, etc., or if he/she have some genetic predisposition such as hemophilia. Bleeding into the spinal canal can cause compression of the spinal  cord with subsequent paralysis.  This would require an emergency surgery to decompress and there are no guarantees that the patient would recover from the paralysis. Pneumothorax: Puncturing of a lung is a possibility, every time a needle is introduced in the area of the chest or upper back.  Pneumothorax refers to free air around the collapsed lung(s), inside of the thoracic cavity (chest cavity).  Another two possible complications related to a similar event would include: Hemothorax and Chylothorax. These are variations of the Pneumothorax, where instead of air around the collapsed lung(s), you may have blood or chyle, respectively. Spinal headaches: They may occur with any procedures in the area of the spine. Persistent CSF (Cerebro-Spinal Fluid) leakage: This is a rare problem, but may occur with prolonged intrathecal or epidural catheters either due to the formation of a fistulous track or a dural tear. Nerve damage: By working so close to the spinal cord, there is always a possibility of nerve damage, which could be as serious as a permanent spinal cord injury with paralysis. Death: Although rare, severe deadly allergic reactions known as "Anaphylactic reaction" can occur to any of the medications used. Worsening of the symptoms: We can always make   thing worse.  What are the chances of something like this happening? Chances of any of this occuring are extremely low.  By statistics, you have more of a chance of getting killed in a motor vehicle accident: while driving to the hospital than any of the above occurring .  Nevertheless, you should be aware  that they are possibilities.  In general, it is similar to taking a shower.  Everybody knows that you can slip, hit your head and get killed.  Does that mean that you should not shower again?  Nevertheless always keep in mind that statistics do not mean anything if you happen to be on the wrong side of them.  Even if a procedure has a 1 (one) in a 1,000,000 (million) chance of going wrong, it you happen to be that one..Also, keep in mind that by statistics, you have more of a chance of having something go wrong when taking medications.  Who should not have this procedure? If you are on a blood thinning medication (e.g. Coumadin, Plavix, see list of "Blood Thinners"), or if you have an active infection going on, you should not have the procedure.  If you are taking any blood thinners, please inform your physician.  Preparing for your procedure: Do not eat or drink anything at least eight (8) hours prior to the procedure. Bring a driver with you .  It cannot be a taxi. Come accompanied by an adult that can drive you back, and that is strong enough to help you if your legs get weak or numb from the local anesthetic. Take all of your medicines the morning of the procedure with just enough water to swallow them. If you have diabetes, make sure that you are scheduled to have your procedure done first thing in the morning, whenever possible. If you have diabetes, take only half of your insulin dose and notify our nurse that you have done so as soon as you arrive at the clinic. If you are diabetic, but only take blood sugar pills (oral hypoglycemic), then do not take them on the morning of your procedure.  You may take them after you have had the procedure. Do not take aspirin or any aspirin-containing medications, at least eleven (11) days prior to the procedure.  They may prolong bleeding. Wear loose fitting clothing that may be easy to take off and that you would not mind if it got stained with Betadine or  blood. Do not wear any jewelry or perfume Remove any nail coloring.  It will interfere with some of our monitoring equipment. If you take Metformin for your diabetes, stop it 48 hours prior to the procedure.  NOTE: Remember that this is not meant to be interpreted as a complete list of all possible complications.  Unforeseen problems may occur.  BLOOD THINNERS The following drugs contain aspirin or other products, which can cause increased bleeding during surgery and should not be taken for 2 weeks prior to and 1 week after surgery.  If you should need take something for relief of minor pain, you may take acetaminophen which is found in Tylenol,m Datril, Anacin-3 and Panadol. It is not blood thinner. The products listed below are.  Do not take any of the products listed below in addition to any listed on your instruction sheet.  A.P.C or A.P.C with Codeine Codeine Phosphate Capsules #3 Ibuprofen Ridaura  ABC compound Congesprin Imuran rimadil  Advil Cope Indocin Robaxisal  Alka-Seltzer Effervescent Pain Reliever and Antacid Coricidin or   Coricidin-D  Indomethacin Rufen  Alka-Seltzer plus Cold Medicine Cosprin Ketoprofen S-A-C Tablets  Anacin Analgesic Tablets or Capsules Coumadin Korlgesic Salflex  Anacin Extra Strength Analgesic tablets or capsules CP-2 Tablets Lanoril Salicylate  Anaprox Cuprimine Capsules Levenox Salocol  Anexsia-D Dalteparin Magan Salsalate  Anodynos Darvon compound Magnesium Salicylate Sine-off  Ansaid Dasin Capsules Magsal Sodium Salicylate  Anturane Depen Capsules Marnal Soma  APF Arthritis pain formula Dewitt's Pills Measurin Stanback  Argesic Dia-Gesic Meclofenamic Sulfinpyrazone  Arthritis Bayer Timed Release Aspirin Diclofenac Meclomen Sulindac  Arthritis pain formula Anacin Dicumarol Medipren Supac  Analgesic (Safety coated) Arthralgen Diffunasal Mefanamic Suprofen  Arthritis Strength Bufferin Dihydrocodeine Mepro Compound Suprol  Arthropan liquid Dopirydamole  Methcarbomol with Aspirin Synalgos  ASA tablets/Enseals Disalcid Micrainin Tagament  Ascriptin Doan's Midol Talwin  Ascriptin A/D Dolene Mobidin Tanderil  Ascriptin Extra Strength Dolobid Moblgesic Ticlid  Ascriptin with Codeine Doloprin or Doloprin with Codeine Momentum Tolectin  Asperbuf Duoprin Mono-gesic Trendar  Aspergum Duradyne Motrin or Motrin IB Triminicin  Aspirin plain, buffered or enteric coated Durasal Myochrisine Trigesic  Aspirin Suppositories Easprin Nalfon Trillsate  Aspirin with Codeine Ecotrin Regular or Extra Strength Naprosyn Uracel  Atromid-S Efficin Naproxen Ursinus  Auranofin Capsules Elmiron Neocylate Vanquish  Axotal Emagrin Norgesic Verin  Azathioprine Empirin or Empirin with Codeine Normiflo Vitamin E  Azolid Emprazil Nuprin Voltaren  Bayer Aspirin plain, buffered or children's or timed BC Tablets or powders Encaprin Orgaran Warfarin Sodium  Buff-a-Comp Enoxaparin Orudis Zorpin  Buff-a-Comp with Codeine Equegesic Os-Cal-Gesic   Buffaprin Excedrin plain, buffered or Extra Strength Oxalid   Bufferin Arthritis Strength Feldene Oxphenbutazone   Bufferin plain or Extra Strength Feldene Capsules Oxycodone with Aspirin   Bufferin with Codeine Fenoprofen Fenoprofen Pabalate or Pabalate-SF   Buffets II Flogesic Panagesic   Buffinol plain or Extra Strength Florinal or Florinal with Codeine Panwarfarin   Buf-Tabs Flurbiprofen Penicillamine   Butalbital Compound Four-way cold tablets Penicillin   Butazolidin Fragmin Pepto-Bismol   Carbenicillin Geminisyn Percodan   Carna Arthritis Reliever Geopen Persantine   Carprofen Gold's salt Persistin   Chloramphenicol Goody's Phenylbutazone   Chloromycetin Haltrain Piroxlcam   Clmetidine heparin Plaquenil   Cllnoril Hyco-pap Ponstel   Clofibrate Hydroxy chloroquine Propoxyphen         Before stopping any of these medications, be sure to consult the physician who ordered them.  Some, such as Coumadin (Warfarin) are ordered  to prevent or treat serious conditions such as "deep thrombosis", "pumonary embolisms", and other heart problems.  The amount of time that you may need off of the medication may also vary with the medication and the reason for which you were taking it.  If you are taking any of these medications, please make sure you notify your pain physician before you undergo any procedures. ____________________________________________________________________________________________  

## 2017-10-26 ENCOUNTER — Other Ambulatory Visit: Payer: Self-pay | Admitting: Family Medicine

## 2017-10-26 DIAGNOSIS — R928 Other abnormal and inconclusive findings on diagnostic imaging of breast: Secondary | ICD-10-CM

## 2017-10-26 DIAGNOSIS — N6320 Unspecified lump in the left breast, unspecified quadrant: Secondary | ICD-10-CM

## 2017-11-02 ENCOUNTER — Encounter: Payer: Self-pay | Admitting: Student in an Organized Health Care Education/Training Program

## 2017-11-02 ENCOUNTER — Ambulatory Visit
Admission: RE | Admit: 2017-11-02 | Discharge: 2017-11-02 | Disposition: A | Discharge status: Home / Self Care | Payer: 59 | Source: Ambulatory Visit | Attending: Student in an Organized Health Care Education/Training Program | Admitting: Student in an Organized Health Care Education/Training Program

## 2017-11-02 ENCOUNTER — Other Ambulatory Visit: Payer: Self-pay

## 2017-11-02 ENCOUNTER — Ambulatory Visit (HOSPITAL_BASED_OUTPATIENT_CLINIC_OR_DEPARTMENT_OTHER): Payer: 59 | Admitting: Student in an Organized Health Care Education/Training Program

## 2017-11-02 DIAGNOSIS — M5416 Radiculopathy, lumbar region: Secondary | ICD-10-CM | POA: Diagnosis present

## 2017-11-02 DIAGNOSIS — Z79891 Long term (current) use of opiate analgesic: Secondary | ICD-10-CM | POA: Insufficient documentation

## 2017-11-02 DIAGNOSIS — Z888 Allergy status to other drugs, medicaments and biological substances status: Secondary | ICD-10-CM | POA: Diagnosis not present

## 2017-11-02 DIAGNOSIS — Z79899 Other long term (current) drug therapy: Secondary | ICD-10-CM | POA: Insufficient documentation

## 2017-11-02 DIAGNOSIS — Z9889 Other specified postprocedural states: Secondary | ICD-10-CM | POA: Diagnosis not present

## 2017-11-02 DIAGNOSIS — Z90710 Acquired absence of both cervix and uterus: Secondary | ICD-10-CM | POA: Diagnosis not present

## 2017-11-02 MED ORDER — SODIUM CHLORIDE 0.9 % IJ SOLN
INTRAMUSCULAR | Status: AC
  Filled 2017-11-02: qty 10

## 2017-11-02 MED ORDER — DEXAMETHASONE SODIUM PHOSPHATE 10 MG/ML IJ SOLN
10.0000 mg | Freq: Once | INTRAMUSCULAR | Status: AC
  Administered 2017-11-02: 10 mg
  Filled 2017-11-02: qty 1

## 2017-11-02 MED ORDER — ROPIVACAINE HCL 2 MG/ML IJ SOLN
2.0000 mL | Freq: Once | INTRAMUSCULAR | Status: AC
  Administered 2017-11-02: 10 mL via EPIDURAL
  Filled 2017-11-02: qty 10

## 2017-11-02 MED ORDER — LACTATED RINGERS IV SOLN: 1000.0000 mL | Freq: Once | INTRAVENOUS | Status: DC

## 2017-11-02 MED ORDER — LIDOCAINE HCL 2 % IJ SOLN
10.0000 mL | Freq: Once | INTRAMUSCULAR | Status: AC
  Administered 2017-11-02: 400 mg
  Filled 2017-11-02: qty 20

## 2017-11-02 MED ORDER — IOPAMIDOL (ISOVUE-M 200) INJECTION 41%
10.0000 mL | Freq: Once | INTRAMUSCULAR | Status: AC
  Administered 2017-11-02: 10 mL via EPIDURAL
  Filled 2017-11-02: qty 10

## 2017-11-02 MED ORDER — FENTANYL CITRATE (PF) 100 MCG/2ML IJ SOLN: 25.0000 ug | INTRAMUSCULAR | Status: DC | PRN

## 2017-11-02 MED ORDER — SODIUM CHLORIDE 0.9% FLUSH
2.0000 mL | Freq: Once | INTRAVENOUS | Status: AC
  Administered 2017-11-02: 10 mL

## 2017-11-02 NOTE — Progress Notes (Signed)
Safety precautions to be maintained throughout the outpatient stay will include: orient to surroundings, keep bed in low position, maintain call bell within reach at all times, provide assistance with transfer out of bed and ambulation.  

## 2017-11-02 NOTE — Progress Notes (Signed)
Patient's Name: Maureen Hodge  MRN: [id number]  Referring Provider: Gillis Santa, MD  DOB: [birth year]  PCP: Wardell Honour, MD  DOS: 11/02/2017  Note by: Gillis Santa, MD  Service setting: Ambulatory outpatient  Specialty: Interventional Pain Management  Patient type: Established  Location: [facility] (AMB) Pain Management Facility  Visit type: Interventional Procedure   Primary Reason for Visit: Interventional Pain Management Treatment. CC: Procedure  Procedure:          Anesthesia, Analgesia, Anxiolysis:  Type: Therapeutic Inter-Laminar Epidural Steroid Injection #3  Region: Lumbar Level: L3-4 Level. Laterality: Left-Sided         Type: Local Anesthesia Indication(s): Analgesia         Route: Infiltration ([place]/IM) IV Access: Declined Sedation: Declined  Local Anesthetic: Lidocaine 1-2%   Indications: 1. Lumbar radiculopathy    Pain Score: Pre-procedure: 10-Worst pain ever/10 Post-procedure: 0-No pain/10  Pre-op Assessment:  Maureen Hodge is a 67 y.o. (year old), female patient, seen today for interventional treatment. She  has a past surgical history that includes Dilation and curettage of uterus; Hemorrhoid surgery; Breast surgery; Foot surgery; Brain surgery (2013); lumbar surgery (2000); Brain AVM surgery; Tonsillectomy and adenoidectomy; Spine surgery; Hernia repair; Total abdominal hysterectomy (1979); Colonoscopy with propofol (N/A, 07/12/2016); polypectomy (07/12/2016); Breast biopsy (Bilateral); and Breast cyst aspiration (Bilateral). Maureen Hodge has a current medication list which includes the following prescription(s): acetaminophen, atenolol, cetirizine, ciclopirox, clobetasol cream, furosemide, gabapentin, hydroxychloroquine, hydroxyzine, lidocaine, methotrexate, mometasone, nitroglycerin, nortriptyline, pantoprazole, potassium chloride sa, prednisone, tramadol, and triamterene-hydrochlorothiazide, and the following Facility-Administered Medications: fentanyl and lactated ringers. Her  primarily concern today is the Procedure  Initial Vital Signs:  Pulse/HCG Rate: 60ECG Heart Rate: (!) 57 Temp: 98 F (36.7 C) Resp: 16 BP: (!) 129/91 SpO2: 98 %  BMI: Estimated body mass index is 33.84 kg/m as calculated from the following:   Height as of this encounter: 5\' 2"  (1.575 m).   Weight as of this encounter: 185 lb (83.9 kg).  Risk Assessment: Allergies: Reviewed. She is allergic to lisinopril.  Allergy Precautions: None required Coagulopathies: Reviewed. None identified.  Blood-thinner therapy: None at this time Active Infection(s): Reviewed. None identified. Maureen Hodge is afebrile  Site Confirmation: Maureen Hodge was asked to confirm the procedure and laterality before marking the site Procedure checklist: Completed Consent: Before the procedure and under the influence of no sedative(s), amnesic(s), or anxiolytics, the patient was informed of the treatment options, risks and possible complications. To fulfill our ethical and legal obligations, as recommended by the American Medical Association's Code of Ethics, I have informed the patient of my clinical impression; the nature and purpose of the treatment or procedure; the risks, benefits, and possible complications of the intervention; the alternatives, including doing nothing; the risk(s) and benefit(s) of the alternative treatment(s) or procedure(s); and the risk(s) and benefit(s) of doing nothing. The patient was provided information about the general risks and possible complications associated with the procedure. These may include, but are not limited to: failure to achieve desired goals, infection, bleeding, organ or nerve damage, allergic reactions, paralysis, and death. In addition, the patient was informed of those risks and complications associated to Spine-related procedures, such as failure to decrease pain; infection (i.e.: Meningitis, epidural or intraspinal abscess); bleeding (i.e.: epidural hematoma, subarachnoid  hemorrhage, or any other type of intraspinal or peri-dural bleeding); organ or nerve damage (i.e.: Any type of peripheral nerve, nerve root, or spinal cord injury) with subsequent damage to sensory, motor, and/or autonomic systems, resulting in permanent pain,  numbness, and/or weakness of one or several areas of the body; allergic reactions; (i.e.: anaphylactic reaction); and/or death. Furthermore, the patient was informed of those risks and complications associated with the medications. These include, but are not limited to: allergic reactions (i.e.: anaphylactic or anaphylactoid reaction(s)); adrenal axis suppression; blood sugar elevation that in diabetics may result in ketoacidosis or comma; water retention that in patients with history of congestive heart failure may result in shortness of breath, pulmonary edema, and decompensation with resultant heart failure; weight gain; swelling or edema; medication-induced neural toxicity; particulate matter embolism and blood vessel occlusion with resultant organ, and/or nervous system infarction; and/or aseptic necrosis of one or more joints. Finally, the patient was informed that Medicine is not an exact science; therefore, there is also the possibility of unforeseen or unpredictable risks and/or possible complications that may result in a catastrophic outcome. The patient indicated having understood very clearly. We have given the patient no guarantees and we have made no promises. Enough time was given to the patient to ask questions, all of which were answered to the patient's satisfaction. Maureen Hodge has indicated that she wanted to continue with the procedure. Attestation: I, the ordering provider, attest that I have discussed with the patient the benefits, risks, side-effects, alternatives, likelihood of achieving goals, and potential problems during recovery for the procedure that I have provided informed consent. Date  Time: 11/02/2017  9:45  AM  Pre-Procedure Preparation:  Monitoring: As per clinic protocol. Respiration, ETCO2, SpO2, BP, heart rate and rhythm monitor placed and checked for adequate function Safety Precautions: Patient was assessed for positional comfort and pressure points before starting the procedure. Time-out: I initiated and conducted the "Time-out" before starting the procedure, as per protocol. The patient was asked to participate by confirming the accuracy of the "Time Out" information. Verification of the correct person, site, and procedure were performed and confirmed by me, the nursing staff, and the patient. "Time-out" conducted as per Joint Commission's Universal Protocol (UP.01.01.01). Time: 1055  Description of Procedure:          Position: Prone with head of the table was raised to facilitate breathing. Target Area: The interlaminar space, initially targeting the lower laminar border of the superior vertebral body. Approach: Paramedial approach. Area Prepped: Entire Posterior Lumbar Region Prepping solution: ChloraPrep (2% chlorhexidine gluconate and 70% isopropyl alcohol) Safety Precautions: Aspiration looking for blood return was conducted prior to all injections. At no point did we inject any substances, as a needle was being advanced. No attempts were made at seeking any paresthesias. Safe injection practices and needle disposal techniques used. Medications properly checked for expiration dates. SDV (single dose vial) medications used. Description of the Procedure: Protocol guidelines were followed. The procedure needle was introduced through the skin, ipsilateral to the reported pain, and advanced to the target area. Bone was contacted and the needle walked caudad, until the lamina was cleared. The epidural space was identified using "loss-of-resistance technique" with 2-3 ml of PF-NaCl (0.9% NSS), in a 5cc LOR glass syringe. Vitals:   11/02/17 1055 11/02/17 1058 11/02/17 1103 11/02/17 1108  BP: (!)  127/99 (!) 130/94 (!) 128/95 (!) 149/101  Pulse:      Resp: 20 20 20 20   Temp:      SpO2: 97% 98% 96% 99%  Weight:      Height:        Start Time: 1055 hrs. End Time: 1104 hrs. Materials:  Needle(s) Type: Epidural needle Gauge: 17G Length: 3.5-in Medication(s): Please  see orders for medications and dosing details. 9 cc solution consisting of 6 cc of preservative-free saline, 1 cc of Decadron 10 mg/cc, 2 cc of 0.2% ropivacaine Imaging Guidance (Spinal):          Type of Imaging Technique: Fluoroscopy Guidance (Spinal) Indication(s): Assistance in needle guidance and placement for procedures requiring needle placement in or near specific anatomical locations not easily accessible without such assistance. Exposure Time: Please see nurses notes. Contrast: Before injecting any contrast, we confirmed that the patient did not have an allergy to iodine, shellfish, or radiological contrast. Once satisfactory needle placement was completed at the desired level, radiological contrast was injected. Contrast injected under live fluoroscopy. No contrast complications. See chart for type and volume of contrast used. Fluoroscopic Guidance: I was personally present during the use of fluoroscopy. "Tunnel Vision Technique" used to obtain the best possible view of the target area. Parallax error corrected before commencing the procedure. "Direction-depth-direction" technique used to introduce the needle under continuous pulsed fluoroscopy. Once target was reached, antero-posterior, oblique, and lateral fluoroscopic projection used confirm needle placement in all planes. Images permanently stored in EMR. Interpretation: I personally interpreted the imaging intraoperatively. Adequate needle placement confirmed in multiple planes. Appropriate spread of contrast into desired area was observed. No evidence of afferent or efferent intravascular uptake. No intrathecal or subarachnoid spread observed. Permanent images  saved into the patient's record.  Antibiotic Prophylaxis:   Anti-infectives (From admission, onward)   None     Indication(s): None identified  Post-operative Assessment:  Post-procedure Vital Signs:  Pulse/HCG Rate: 6063 Temp: 98 F (36.7 C) Resp: 20 BP: (!) 149/101 SpO2: 99 %  EBL: None  Complications: No immediate post-treatment complications observed by team, or reported by patient.  Note: The patient tolerated the entire procedure well. A repeat set of vitals were taken after the procedure and the patient was kept under observation following institutional policy, for this type of procedure. Post-procedural neurological assessment was performed, showing return to baseline, prior to discharge. The patient was provided with post-procedure discharge instructions, including a section on how to identify potential problems. Should any problems arise concerning this procedure, the patient was given instructions to immediately contact us, at any time, without hesitation. In any case, we plan to contact the patient by telephone for a follow-up status report regarding this interventional procedure.  Comments:  No additional relevant information. 5 out of 5 strength bilateral lower extremity: Plantar flexion, dorsiflexion, knee flexion, knee extension.  Plan of Care    Imaging Orders     DG C-Arm 1-60 Min-No Report Procedure Orders    No procedure(s) ordered today    Medications ordered for procedure: Meds ordered this encounter  Medications  . lactated ringers infusion 1,000 mL  . fentaNYL (SUBLIMAZE) injection 25-100 mcg    Make sure Narcan is available in the pyxis when using this medication. In the event of respiratory depression (RR< 8/min): Titrate NARCAN (naloxone) in increments of 0.1 to 0.2 mg IV at 2-3 minute intervals, until desired degree of reversal.  . iopamidol (ISOVUE-M) 41 % intrathecal injection 10 mL  . ropivacaine (PF) 2 mg/mL (0.2%) (NAROPIN) injection 2 mL   . sodium chloride flush (NS) 0.9 % injection 2 mL  . lidocaine (XYLOCAINE) 2 % (with pres) injection 200 mg  . dexamethasone (DECADRON) injection 10 mg   Medications administered: We administered iopamidol, ropivacaine (PF) 2 mg/mL (0.2%), sodium chloride flush, lidocaine, and dexamethasone.  See the medical record for exact dosing, route, and  time of administration.  New Prescriptions   No medications on file   Disposition: Discharge home  Discharge Date & Time: 11/02/2017; 1115 hrs.   Physician-requested Follow-up: Return in about 6 weeks (around 12/14/2017) for Post Procedure Evaluation.  Future Appointments  Date Time Provider [place]  11/04/2017 10:40 AM [facility]  11/04/2017 11:00 AM [facility] [place] [facility]  12/13/2017  8:30 AM Gillis Santa, MD [facility] None   Primary Care Physician: Wardell Honour, MD Location: [facility] Note by: Gillis Santa, MD Date: 11/02/2017; Time: 12:44 PM  Disclaimer:  Medicine is not an exact science. The only guarantee in medicine is that nothing is guaranteed. It is important to note that the decision to proceed with this intervention was based on the information collected from the patient. The Data and conclusions were drawn from the patient's questionnaire, the interview, and the physical examination. Because the information was provided in large part by the patient, it cannot be guaranteed that it has not been purposely or unconsciously manipulated. Every effort has been made to obtain as much relevant data as possible for this evaluation. It is important to note that the conclusions that lead to this procedure are derived in large part from the available data. Always take into account that the treatment will also be dependent on availability of resources and existing treatment guidelines, considered by other Pain Management Practitioners as being common knowledge and practice, at the  time of the intervention. For Medico-Legal purposes, it is also important to point out that variation in procedural techniques and pharmacological choices are the acceptable norm. The indications, contraindications, technique, and results of the above procedure should only be interpreted and judged by a Board-Certified Interventional Pain Specialist with extensive familiarity and expertise in the same exact procedure and technique.

## 2017-11-02 NOTE — Patient Instructions (Signed)

## 2017-11-03 ENCOUNTER — Telehealth: Payer: Self-pay

## 2017-11-03 NOTE — Telephone Encounter (Signed)
Post procedure phone call.  LM 

## 2017-11-03 NOTE — Telephone Encounter (Signed)
Pt Missed call from [place] wanted her to know that she is doing good after procedure with no pain.

## 2017-11-04 ENCOUNTER — Ambulatory Visit
Admission: RE | Admit: 2017-11-04 | Discharge: 2017-11-04 | Disposition: A | Discharge status: Home / Self Care | Payer: 59 | Source: Ambulatory Visit | Attending: Family Medicine | Admitting: Family Medicine

## 2017-11-04 DIAGNOSIS — N6320 Unspecified lump in the left breast, unspecified quadrant: Secondary | ICD-10-CM | POA: Diagnosis present

## 2017-11-04 DIAGNOSIS — R928 Other abnormal and inconclusive findings on diagnostic imaging of breast: Secondary | ICD-10-CM | POA: Insufficient documentation

## 2017-11-07 IMAGING — [facility] MM DIGITAL SCREENING BILAT W/ CAD
5 series · 5 of 5 positions shown · non-contrast
Comparison: Previous exam(s).

CLINICAL DATA: Screening.

EXAM:
DIGITAL SCREENING BILATERAL MAMMOGRAM WITH CAD

[L MLO]
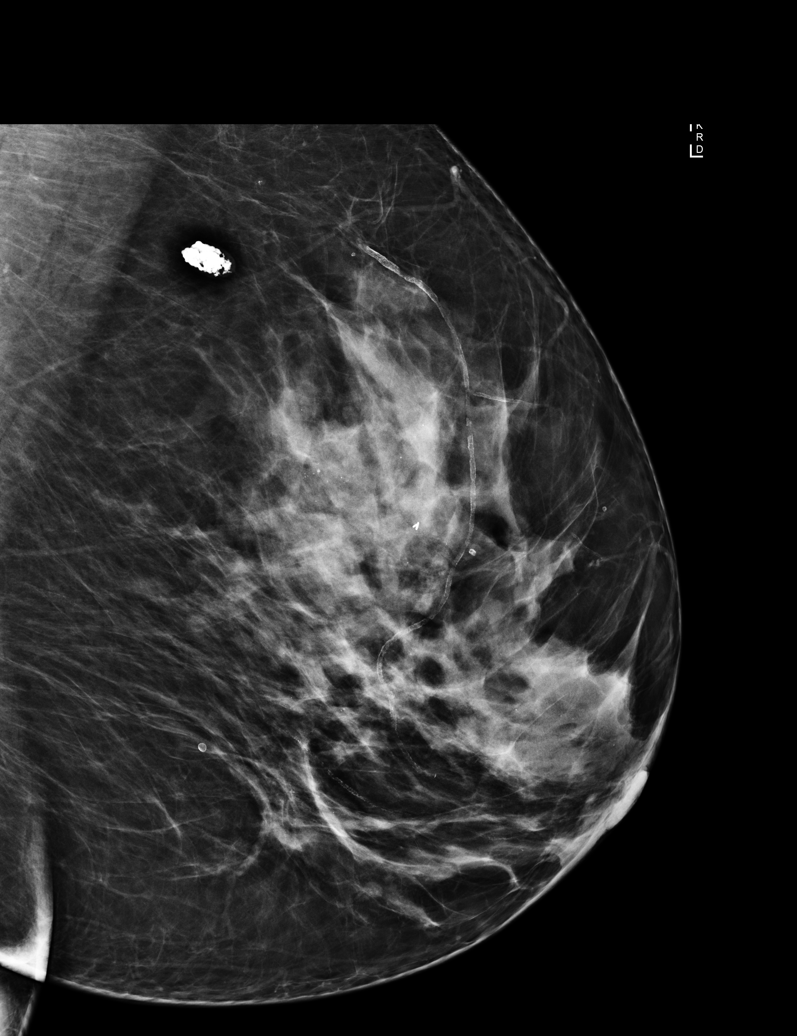

[R CC]
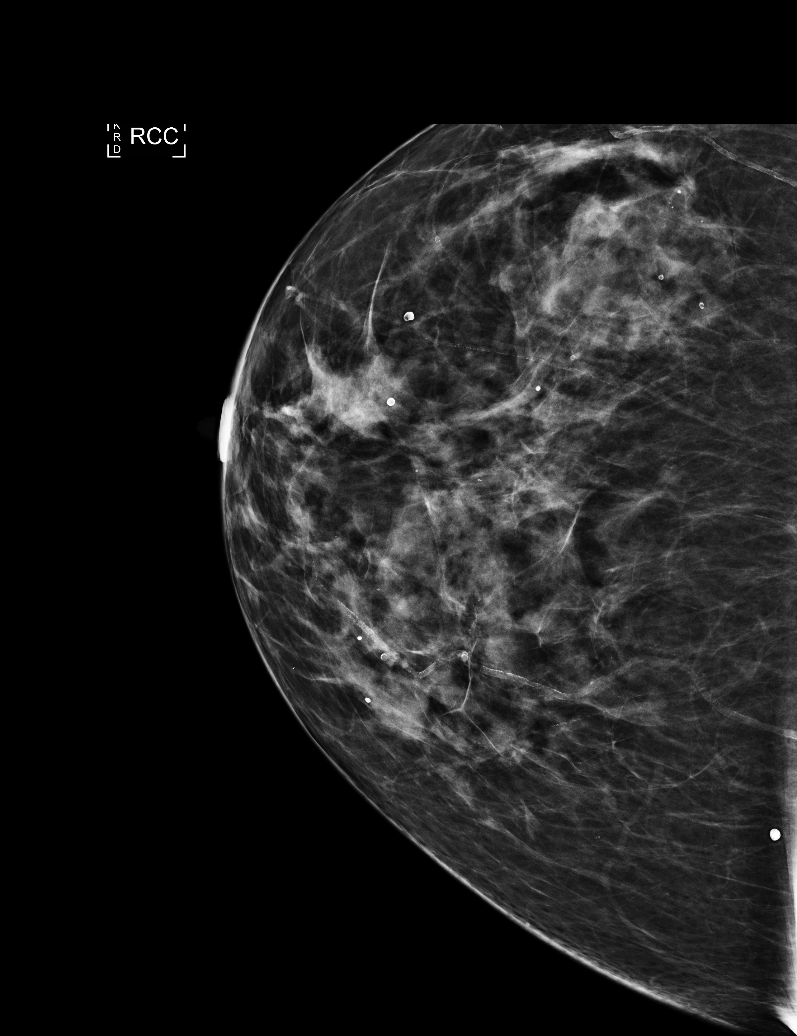

[R CV]
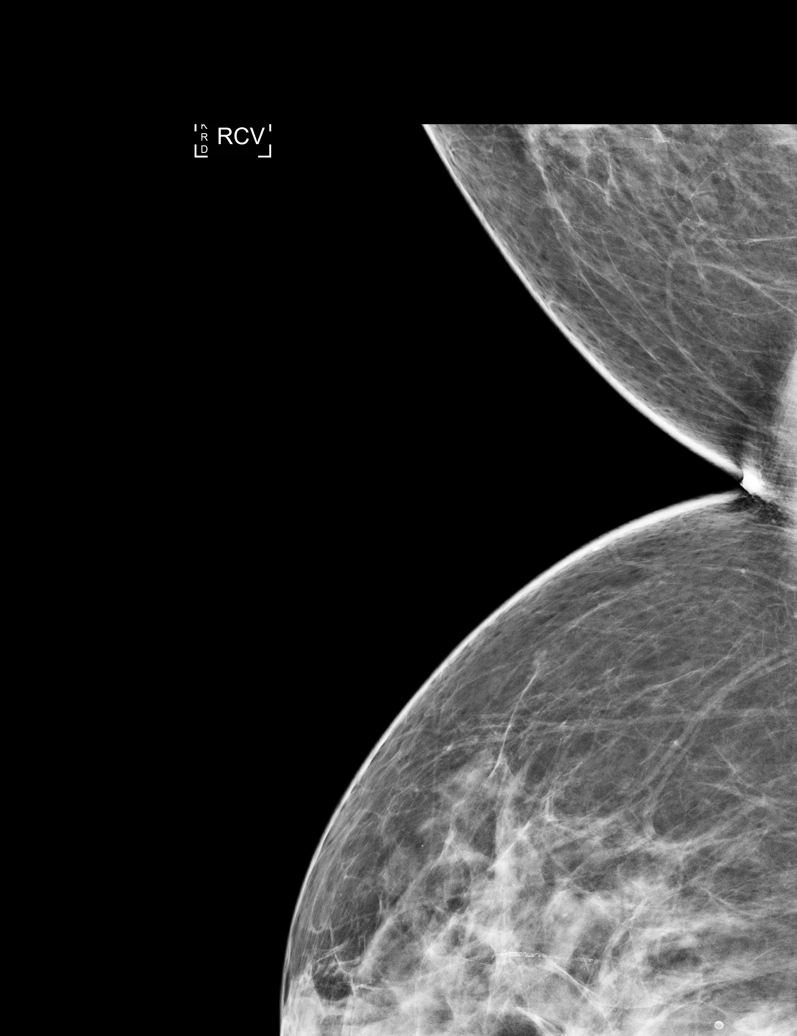

[R MLO]
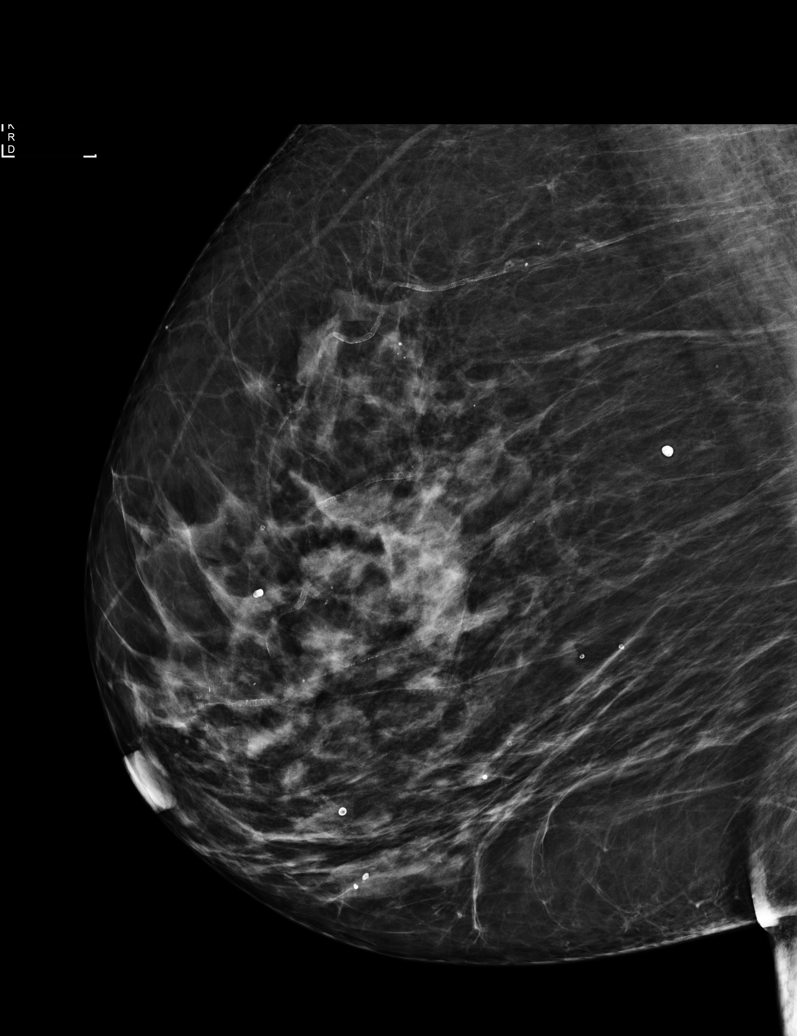

[L CC]
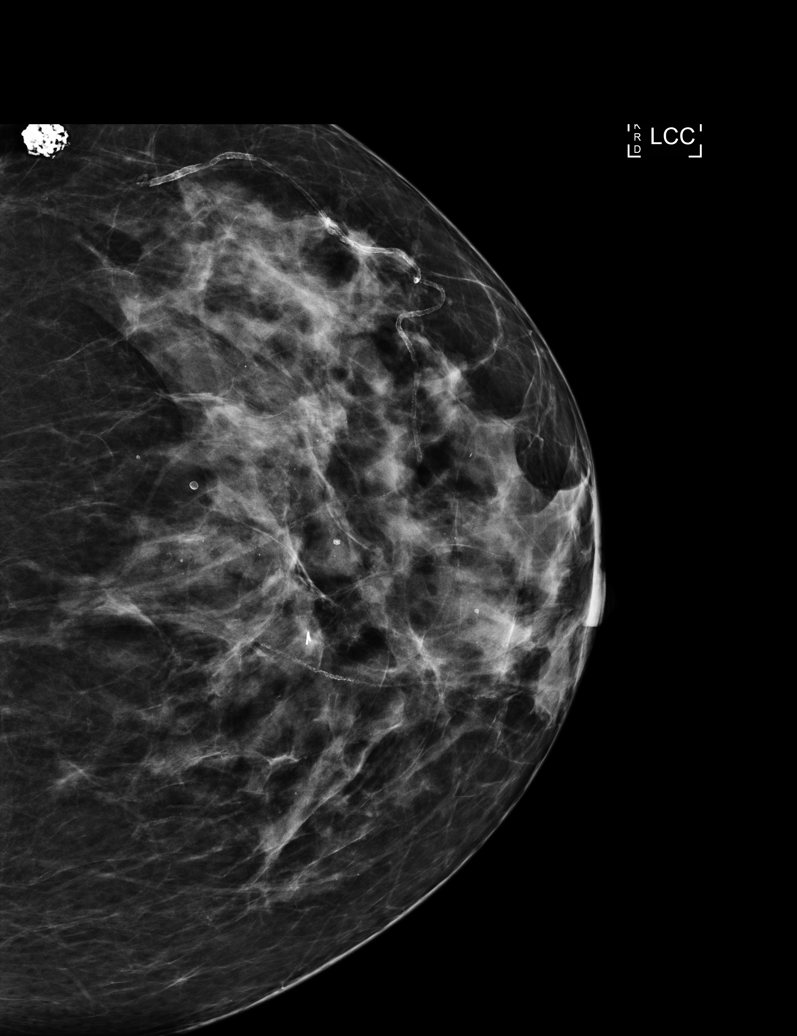

[5 of 5 positions shown; findings below may reference images not displayed]

ACR Breast Density Category c: The breast tissue is heterogeneously
dense, which may obscure small masses.
FINDINGS: There are no findings suspicious for malignancy. Images were
processed with CAD.
IMPRESSION: No mammographic evidence of malignancy. A result letter of this
screening mammogram will be mailed directly to the patient.

RECOMMENDATION:
Screening mammogram in one year. (Code:[id number])

BI-RADS CATEGORY  1: Negative.

## 2017-12-08 ENCOUNTER — Other Ambulatory Visit: Payer: Self-pay

## 2017-12-08 ENCOUNTER — Ambulatory Visit
Payer: 59 | Attending: Student in an Organized Health Care Education/Training Program | Admitting: Student in an Organized Health Care Education/Training Program

## 2017-12-08 ENCOUNTER — Encounter: Payer: Self-pay | Admitting: Student in an Organized Health Care Education/Training Program

## 2017-12-08 VITALS — BP 133/92 | HR 62 | Temp 97.7°F | Resp 18 | Ht 62.0 in | Wt 185.0 lb

## 2017-12-08 DIAGNOSIS — K219 Gastro-esophageal reflux disease without esophagitis: Secondary | ICD-10-CM | POA: Insufficient documentation

## 2017-12-08 DIAGNOSIS — M545 Low back pain: Secondary | ICD-10-CM | POA: Diagnosis present

## 2017-12-08 DIAGNOSIS — M47816 Spondylosis without myelopathy or radiculopathy, lumbar region: Secondary | ICD-10-CM | POA: Diagnosis not present

## 2017-12-08 DIAGNOSIS — I10 Essential (primary) hypertension: Secondary | ICD-10-CM | POA: Diagnosis not present

## 2017-12-08 DIAGNOSIS — M353 Polymyalgia rheumatica: Secondary | ICD-10-CM | POA: Insufficient documentation

## 2017-12-08 DIAGNOSIS — E669 Obesity, unspecified: Secondary | ICD-10-CM | POA: Insufficient documentation

## 2017-12-08 DIAGNOSIS — G4733 Obstructive sleep apnea (adult) (pediatric): Secondary | ICD-10-CM | POA: Insufficient documentation

## 2017-12-08 DIAGNOSIS — Z9889 Other specified postprocedural states: Secondary | ICD-10-CM | POA: Diagnosis not present

## 2017-12-08 DIAGNOSIS — G894 Chronic pain syndrome: Secondary | ICD-10-CM | POA: Diagnosis not present

## 2017-12-08 DIAGNOSIS — Z6833 Body mass index (BMI) 33.0-33.9, adult: Secondary | ICD-10-CM | POA: Insufficient documentation

## 2017-12-08 NOTE — Progress Notes (Signed)
Safety precautions to be maintained throughout the outpatient stay will include: orient to surroundings, keep bed in low position, maintain call bell within reach at all times, provide assistance with transfer out of bed and ambulation.  

## 2017-12-08 NOTE — Patient Instructions (Signed)
Facet Blocks Patient Information  Description: The facets are joints in the spine between the vertebrae.  Like any joints in the body, facets can become irritated and painful.  Arthritis can also effect the facets.  By injecting steroids and local anesthetic in and around these joints, we can temporarily block the nerve supply to them.  Steroids act directly on irritated nerves and tissues to reduce selling and inflammation which often leads to decreased pain.  Facet blocks may be done anywhere along the spine from the neck to the low back depending upon the location of your pain.   After numbing the skin with local anesthetic (like Novocaine), a small needle is passed onto the facet joints under x-ray guidance.  You may experience a sensation of pressure while this is being done.  The entire block usually lasts about 15-25 minutes.   Conditions which may be treated by facet blocks:   Low back/buttock pain  Neck/shoulder pain  Certain types of headaches  Preparation for the injection:  1. Do not eat any solid food or dairy products within 8 hours of your appointment. 2. You may drink clear liquid up to 3 hours before appointment.  Clear liquids include water, black coffee, juice or soda.  No milk or cream please. 3. You may take your regular medication, including pain medications, with a sip of water before your appointment.  Diabetics should hold regular insulin (if taken separately) and take 1/2 normal NPH dose the morning of the procedure.  Carry some sugar containing items with you to your appointment. 4. A driver must accompany you and be prepared to drive you home after your procedure. 5. Bring all your current medications with you. 6. An IV may be inserted and sedation may be given at the discretion of the physician. 7. A blood pressure cuff, EKG and other monitors will often be applied during the procedure.  Some patients may need to have extra oxygen administered for a short  period. 8. You will be asked to provide medical information, including your allergies and medications, prior to the procedure.  We must know immediately if you are taking blood thinners (like Coumadin/Warfarin) or if you are allergic to IV iodine contrast (dye).  We must know if you could possible be pregnant.  Possible side-effects:   Bleeding from needle site  Infection (rare, may require surgery)  Nerve injury (rare)  Numbness & tingling (temporary)  Difficulty urinating (rare, temporary)  Spinal headache (a headache worse with upright posture)  Light-headedness (temporary)  Pain at injection site (serveral days)  Decreased blood pressure (rare, temporary)  Weakness in arm/leg (temporary)  Pressure sensation in back/neck (temporary)   Call if you experience:   Fever/chills associated with headache or increased back/neck pain  Headache worsened by an upright position  New onset, weakness or numbness of an extremity below the injection site  Hives or difficulty breathing (go to the emergency room)  Inflammation or drainage at the injection site(s)  Severe back/neck pain greater than usual  New symptoms which are concerning to you  Please note:  Although the local anesthetic injected can often make your back or neck feel good for several hours after the injection, the pain will likely return. It takes 3-7 days for steroids to work.  You may not notice any pain relief for at least one week.  If effective, we will often do a series of 2-3 injections spaced 3-6 weeks apart to maximally decrease your pain.  After the initial   series, you may be a candidate for a more permanent nerve block of the facets.  If you have any questions, please call #[phone]) [facility]  Moderate Conscious Sedation, Adult Sedation is the use of medicines to promote relaxation and relieve discomfort and anxiety. Moderate conscious sedation is a type of  sedation. Under moderate conscious sedation, you are less alert than normal, but you are still able to respond to instructions, touch, or both. Moderate conscious sedation is used during short medical and dental procedures. It is milder than deep sedation, which is a type of sedation under which you cannot be easily woken up. It is also milder than general anesthesia, which is the use of medicines to make you unconscious. Moderate conscious sedation allows you to return to your regular activities sooner. Tell a health care provider about:  Any allergies you have.  All medicines you are taking, including vitamins, herbs, eye drops, creams, and over-the-counter medicines.  Use of steroids (by mouth or creams).  Any problems you or family members have had with sedatives and anesthetic medicines.  Any blood disorders you have.  Any surgeries you have had.  Any medical conditions you have, such as sleep apnea.  Whether you are pregnant or may be pregnant.  Any use of cigarettes, alcohol, marijuana, or street drugs. What are the risks? Generally, this is a safe procedure. However, problems may occur, including:  Getting too much medicine (oversedation).  Nausea.  Allergic reaction to medicines.  Trouble breathing. If this happens, a breathing tube may be used to help with breathing. It will be removed when you are awake and breathing on your own.  Heart trouble.  Lung trouble.  What happens before the procedure? Staying hydrated Follow instructions from your health care provider about hydration, which may include:  Up to 2 hours before the procedure - you may continue to drink clear liquids, such as water, clear fruit juice, black coffee, and plain tea.  Eating and drinking restrictions Follow instructions from your health care provider about eating and drinking, which may include:  8 hours before the procedure - stop eating heavy meals or foods such as meat, fried foods, or  fatty foods.  6 hours before the procedure - stop eating light meals or foods, such as toast or cereal.  6 hours before the procedure - stop drinking milk or drinks that contain milk.  2 hours before the procedure - stop drinking clear liquids.  Medicine  Ask your health care provider about:  Changing or stopping your regular medicines. This is especially important if you are taking diabetes medicines or blood thinners.  Taking medicines such as aspirin and ibuprofen. These medicines can thin your blood. Do not take these medicines before your procedure if your health care provider instructs you not to.  Tests and exams  You will have a physical exam.  You may have blood tests done to show: ? How well your kidneys and liver are working. ? How well your blood can clot. General instructions  Plan to have someone take you home from the hospital or clinic.  If you will be going home right after the procedure, plan to have someone with you for 24 hours. What happens during the procedure?  An IV tube will be inserted into one of your veins.  Medicine to help you relax (sedative) will be given through the IV tube.  The medical or dental procedure will be performed. What happens after the  procedure?  Your blood pressure, heart rate, breathing rate, and blood oxygen level will be monitored often until the medicines you were given have worn off.  Do not drive for 24 hours. This information is not intended to replace advice given to you by your health care provider. Make sure you discuss any questions you have with your health care provider. Document Released: 09/15/2000 Document Revised: 05/27/2015 Document Reviewed: 04/12/2015 Elsevier Interactive Patient Education  2018 [place]  What are the risk, side effects and possible complications? Generally speaking, most procedures are safe.  However, with any procedure there are risks, side  effects, and the possibility of complications.  The risks and complications are dependent upon the sites that are lesioned, or the type of nerve block to be performed.  The closer the procedure is to the spine, the more serious the risks are.  Great care is taken when placing the radio frequency needles, block needles or lesioning probes, but sometimes complications can occur. 1. Infection: Any time there is an injection through the skin, there is a risk of infection.  This is why sterile conditions are used for these blocks.  There are four possible types of infection. 1. Localized skin infection. 2. Central Nervous System Infection-This can be in the form of Meningitis, which can be deadly. 3. Epidural Infections-This can be in the form of an epidural abscess, which can cause pressure inside of the spine, causing compression of the spinal cord with subsequent paralysis. This would require an emergency surgery to decompress, and there are no guarantees that the patient would recover from the paralysis. 4. Discitis-This is an infection of the intervertebral discs.  It occurs in about 1% of discography procedures.  It is difficult to treat and it may lead to surgery.        2. Pain: the needles have to go through skin and soft tissues, will cause soreness.       3. Damage to internal structures:  The nerves to be lesioned may be near blood vessels or    other nerves which can be potentially damaged.       4. Bleeding: Bleeding is more common if the patient is taking blood thinners such as  aspirin, Coumadin, Ticiid, Plavix, etc., or if he/she have some genetic predisposition  such as hemophilia. Bleeding into the spinal canal can cause compression of the spinal  cord with subsequent paralysis.  This would require an emergency surgery to  decompress and there are no guarantees that the patient would recover from the  paralysis.       5. Pneumothorax:  Puncturing of a lung is a possibility, every time a  needle is introduced in  the area of the chest or upper back.  Pneumothorax refers to free air around the  collapsed lung(s), inside of the thoracic cavity (chest cavity).  Another two possible  complications related to a similar event would include: Hemothorax and Chylothorax.   These are variations of the Pneumothorax, where instead of air around the collapsed  lung(s), you may have blood or chyle, respectively.       6. Spinal headaches: They may occur with any procedures in the area of the spine.       7. Persistent CSF (Cerebro-Spinal Fluid) leakage: This is a rare problem, but may occur  with prolonged intrathecal or epidural catheters either due to the formation of a fistulous  track or a dural tear.  8. Nerve damage: By working so close to the spinal cord, there is always a possibility of  nerve damage, which could be as serious as a permanent spinal cord injury with  paralysis.       9. Death:  Although rare, severe deadly allergic reactions known as "Anaphylactic  reaction" can occur to any of the medications used.      10. Worsening of the symptoms:  We can always make thing worse.  What are the chances of something like this happening? Chances of any of this occuring are extremely low.  By statistics, you have more of a chance of getting killed in a motor vehicle accident: while driving to the hospital than any of the above occurring .  Nevertheless, you should be aware that they are possibilities.  In general, it is similar to taking a shower.  Everybody knows that you can slip, hit your head and get killed.  Does that mean that you should not shower again?  Nevertheless always keep in mind that statistics do not mean anything if you happen to be on the wrong side of them.  Even if a procedure has a 1 (one) in a 1,000,000 (million) chance of going wrong, it you happen to be that one..Also, keep in mind that by statistics, you have more of a chance of having something go wrong when taking  medications.  Who should not have this procedure? If you are on a blood thinning medication (e.g. Coumadin, Plavix, see list of "Blood Thinners"), or if you have an active infection going on, you should not have the procedure.  If you are taking any blood thinners, please inform your physician.  How should I prepare for this procedure?  Do not eat or drink anything at least six hours prior to the procedure.  Bring a driver with you .  It cannot be a taxi.  Come accompanied by an adult that can drive you back, and that is strong enough to help you if your legs get weak or numb from the local anesthetic.  Take all of your medicines the morning of the procedure with just enough water to swallow them.  If you have diabetes, make sure that you are scheduled to have your procedure done first thing in the morning, whenever possible.  If you have diabetes, take only half of your insulin dose and notify our nurse that you have done so as soon as you arrive at the clinic.  If you are diabetic, but only take blood sugar pills (oral hypoglycemic), then do not take them on the morning of your procedure.  You may take them after you have had the procedure.  Do not take aspirin or any aspirin-containing medications, at least eleven (11) days prior to the procedure.  They may prolong bleeding.  Wear loose fitting clothing that may be easy to take off and that you would not mind if it got stained with Betadine or blood.  Do not wear any jewelry or perfume  Remove any nail coloring.  It will interfere with some of our monitoring equipment.  NOTE: Remember that this is not meant to be interpreted as a complete list of all possible complications.  Unforeseen problems may occur.  BLOOD THINNERS The following drugs contain aspirin or other products, which can cause increased bleeding during surgery and should not be taken for 2 weeks prior to and 1 week after surgery.  If you should need take something for  relief of minor  pain, you may take acetaminophen which is found in Tylenol,m Datril, Anacin-3 and Panadol. It is not blood thinner. The products listed below are.  Do not take any of the products listed below in addition to any listed on your instruction sheet.  A.P.C or A.P.C with Codeine Codeine Phosphate Capsules #3 Ibuprofen Ridaura  ABC compound Congesprin Imuran rimadil  Advil Cope Indocin Robaxisal  Alka-Seltzer Effervescent Pain Reliever and Antacid Coricidin or Coricidin-D  Indomethacin Rufen  Alka-Seltzer plus Cold Medicine Cosprin Ketoprofen S-A-C Tablets  Anacin Analgesic Tablets or Capsules Coumadin Korlgesic Salflex  Anacin Extra Strength Analgesic tablets or capsules CP-2 Tablets Lanoril Salicylate  Anaprox Cuprimine Capsules Levenox Salocol  Anexsia-D Dalteparin Magan Salsalate  Anodynos Darvon compound Magnesium Salicylate Sine-off  Ansaid Dasin Capsules Magsal Sodium Salicylate  Anturane Depen Capsules Marnal Soma  APF Arthritis pain formula Dewitt's Pills Measurin Stanback  Argesic Dia-Gesic Meclofenamic Sulfinpyrazone  Arthritis Bayer Timed Release Aspirin Diclofenac Meclomen Sulindac  Arthritis pain formula Anacin Dicumarol Medipren Supac  Analgesic (Safety coated) Arthralgen Diffunasal Mefanamic Suprofen  Arthritis Strength Bufferin Dihydrocodeine Mepro Compound Suprol  Arthropan liquid Dopirydamole Methcarbomol with Aspirin Synalgos  ASA tablets/Enseals Disalcid Micrainin Tagament  Ascriptin Doan's Midol Talwin  Ascriptin A/D Dolene Mobidin Tanderil  Ascriptin Extra Strength Dolobid Moblgesic Ticlid  Ascriptin with Codeine Doloprin or Doloprin with Codeine Momentum Tolectin  Asperbuf Duoprin Mono-gesic Trendar  Aspergum Duradyne Motrin or Motrin IB Triminicin  Aspirin plain, buffered or enteric coated Durasal Myochrisine Trigesic  Aspirin Suppositories Easprin Nalfon Trillsate  Aspirin with Codeine Ecotrin Regular or Extra Strength Naprosyn Uracel  Atromid-S  Efficin Naproxen Ursinus  Auranofin Capsules Elmiron Neocylate Vanquish  Axotal Emagrin Norgesic Verin  Azathioprine Empirin or Empirin with Codeine Normiflo Vitamin E  Azolid Emprazil Nuprin Voltaren  Bayer Aspirin plain, buffered or children's or timed BC Tablets or powders Encaprin Orgaran Warfarin Sodium  Buff-a-Comp Enoxaparin Orudis Zorpin  Buff-a-Comp with Codeine Equegesic Os-Cal-Gesic   Buffaprin Excedrin plain, buffered or Extra Strength Oxalid   Bufferin Arthritis Strength Feldene Oxphenbutazone   Bufferin plain or Extra Strength Feldene Capsules Oxycodone with Aspirin   Bufferin with Codeine Fenoprofen Fenoprofen Pabalate or Pabalate-SF   Buffets II Flogesic Panagesic   Buffinol plain or Extra Strength Florinal or Florinal with Codeine Panwarfarin   Buf-Tabs Flurbiprofen Penicillamine   Butalbital Compound Four-way cold tablets Penicillin   Butazolidin Fragmin Pepto-Bismol   Carbenicillin Geminisyn Percodan   Carna Arthritis Reliever Geopen Persantine   Carprofen Gold's salt Persistin   Chloramphenicol Goody's Phenylbutazone   Chloromycetin Haltrain Piroxlcam   Clmetidine heparin Plaquenil   Cllnoril Hyco-pap Ponstel   Clofibrate Hydroxy chloroquine Propoxyphen         Before stopping any of these medications, be sure to consult the physician who ordered them.  Some, such as Coumadin (Warfarin) are ordered to prevent or treat serious conditions such as "deep thrombosis", "pumonary embolisms", and other heart problems.  The amount of time that you may need off of the medication may also vary with the medication and the reason for which you were taking it.  If you are taking any of these medications, please make sure you notify your pain physician before you undergo any procedures.

## 2017-12-08 NOTE — Progress Notes (Signed)
Patient's Name: Maureen Hodge  MRN: [id number]  Referring Provider: Wardell Honour, MD  DOB: 1950/05/21  PCP: Wardell Honour, MD  DOS: 12/08/2017  Note by: Gillis Santa, MD  Service setting: Ambulatory outpatient  Specialty: Interventional Pain Management  Location: [facility] (AMB) Pain Management Facility    Patient type: Established   Primary Reason(s) for Visit: Encounter for post-procedure evaluation of chronic illness with mild to moderate exacerbation CC: Back Pain (lower, left)  HPI  Maureen Hodge is a 67 y.o. year old, female patient, who comes today for a post-procedure evaluation. She has HYPERTENSION, BENIGN; EDEMA; Hypokalemia; GERD (gastroesophageal reflux disease); Meningioma ([place]); Discoid lupus; Benign neoplasm of cerebral meninges ([place]); Benign cerebral hemangioma ([place]); Lumbar canal stenosis; OSA on CPAP; Neuritis or radiculitis due to rupture of lumbar intervertebral disc; Obesity; Benign neoplasm of ascending colon; Benign neoplasm of cecum; Polymyalgia rheumatica syndrome ([place]); Lumbar facet arthropathy; Lumbar facet joint syndrome; and Hx of lumbosacral spine surgery (Left L5/S1 foraminotomy) on their problem list. Her primarily concern today is the Back Pain (lower, left)  Pain Assessment: Location: Left Back Radiating: from lower left back down side of left leg to side of shin. Onset: More than a month ago Duration: Chronic pain Quality: Constant, Aching Severity: 9 /10 (subjective, self-reported pain score)  Note: Reported level is inconsistent with clinical observations. Clinically the patient looks like a 3/10 A 3/10 is viewed as "Moderate" and described as significantly interfering with activities of daily living (ADL). It becomes difficult to feed, bathe, get dressed, get on and off the toilet or to perform personal hygiene functions. Difficult to get in and out of bed or a chair without assistance. Very distracting. With effort, it can be ignored when deeply involved in  activities. Information on the proper use of the pain scale provided to the patient today. When using our objective Pain Scale, levels between 6 and 10/10 are said to belong in an emergency room, as it progressively worsens from a 6/10, described as severely limiting, requiring emergency care not usually available at an outpatient pain management facility. At a 6/10 level, communication becomes difficult and requires great effort. Assistance to reach the emergency department may be required. Facial flushing and profuse sweating along with potentially dangerous increases in heart rate and blood pressure will be evident. Effect on ADL: "I keep on going even though i'm in pain" Timing: Intermittent Modifying factors: procedure helped "some" BP: (!) 133/92  HR: 62  Maureen Hodge comes in today for post-procedure evaluation.  Further details on both, my assessment(s), as well as the proposed treatment plan, please see below.  Post-Procedure Assessment  11/02/2017 Procedure: Left L3-L4 ESI Pre-procedure pain score:  10/10 Post-procedure pain score: 0/10         Influential Factors: BMI: 33.84 kg/m Intra-procedural challenges: None observed.         Assessment challenges: None detected.              Reported side-effects: None.        Post-procedural adverse reactions or complications: None reported         Sedation: Please see nurses note. When no sedatives are used, the analgesic levels obtained are directly associated to the effectiveness of the local anesthetics. However, when sedation is provided, the level of analgesia obtained during the initial 1 hour following the intervention, is believed to be the result of a combination of factors. These factors may include, but are not limited to: 1. The effectiveness of the  local anesthetics used. 2. The effects of the analgesic(s) and/or anxiolytic(s) used. 3. The degree of discomfort experienced by the patient at the time of the procedure. 4. The  patients ability and reliability in recalling and recording the events. 5. The presence and influence of possible secondary gains and/or psychosocial factors. Reported result: Relief experienced during the 1st hour after the procedure: 100 % (Ultra-Short Term Relief)            Interpretative annotation: Clinically appropriate result. Analgesia during this period is likely to be Local Anesthetic and/or IV Sedative (Analgesic/Anxiolytic) related.          Effects of local anesthetic: The analgesic effects attained during this period are directly associated to the localized infiltration of local anesthetics and therefore cary significant diagnostic value as to the etiological location, or anatomical origin, of the pain. Expected duration of relief is directly dependent on the pharmacodynamics of the local anesthetic used. Long-acting (4-6 hours) anesthetics used.  Reported result: Relief during the next 4 to 6 hour after the procedure: 100 % (Short-Term Relief)            Interpretative annotation: Clinically appropriate result. Analgesia during this period is likely to be Local Anesthetic-related.          Long-term benefit: Defined as the period of time past the expected duration of local anesthetics (1 hour for short-acting and 4-6 hours for long-acting). With the possible exception of prolonged sympathetic blockade from the local anesthetics, benefits during this period are typically attributed to, or associated with, other factors such as analgesic sensory neuropraxia, antiinflammatory effects, or beneficial biochemical changes provided by agents other than the local anesthetics.  Reported result: Extended relief following procedure: 100 %(relief lasted for 1 week) (Long-Term Relief)            Interpretative annotation: Clinically possible results. Good relief. No permanent benefit expected. Inflammation plays a part in the etiology to the pain.          Current benefits: Defined as reported results  that persistent at this point in time.   Analgesia: <25 %            Function: Somewhat improved ROM: Somewhat improved Interpretative annotation: Recurrence of symptoms. No permanent benefit expected. Effective diagnostic intervention.          Interpretation: Results would suggest this therapy to be effective in the management of Ms. Nappier's condition.                  Plan:  Please see "Plan of Care" for details.                Laboratory Chemistry  Inflammation Markers (CRP: Acute Phase) (ESR: Chronic Phase) No results found for: CRP, ESRSEDRATE, LATICACIDVEN                       Rheumatology Markers No results found for: RF, ANA, LABURIC, URICUR, LYMEIGGIGMAB, LYMEABIGMQN, HLAB27                      Renal Function Markers Lab Results  Component Value Date   BUN 9 06/15/2017   CREATININE 0.87 06/15/2017   BCR 10 (L) 06/15/2017   GFRAA 81 06/15/2017   GFRNONAA 71 06/15/2017                             Hepatic Function Markers Lab Results  Component Value Date   AST 24 06/15/2017   ALT 14 06/15/2017   ALBUMIN 3.9 06/15/2017   ALKPHOS 89 06/15/2017   HCVAB NEGATIVE 11/15/2014   LIPASE 24 12/16/2016                        Electrolytes Lab Results  Component Value Date   NA 144 06/15/2017   K 3.3 (L) 06/15/2017   CL 100 06/15/2017   CALCIUM 9.3 06/15/2017   MG 1.5 11/05/2015                        Neuropathy Markers Lab Results  Component Value Date   VITAMINB12 471 01/24/2012   HGBA1C 6.2 (H) 02/09/2017   HIV NONREACTIVE 11/15/2014                        CNS Tests No results found for: COLORCSF, APPEARCSF, RBCCOUNTCSF, WBCCSF, POLYSCSF, LYMPHSCSF, EOSCSF, PROTEINCSF, GLUCCSF, JCVIRUS, CSFOLI, IGGCSF                      Bone Pathology Markers Lab Results  Component Value Date   VD25OH 43 01/24/2012                         Coagulation Parameters Lab Results  Component Value Date   PLT 268 06/15/2017                        Cardiovascular  Markers Lab Results  Component Value Date   BNP 15.4 07/22/2015   TROPONINI <0.03 12/16/2016   HGB 12.3 06/15/2017   HCT 36.4 06/15/2017                         CA Markers No results found for: CEA, CA125, LABCA2                      Note: Lab results reviewed.  Recent Diagnostic Imaging Results  MM DIAG BREAST TOMO UNI LEFT CLINICAL DATA:  67 year old patient recalled from recent screening mammogram for evaluation a possible mass in the left breast questioned in the CC projection.  EXAM: DIGITAL DIAGNOSTIC UNILATERAL LEFT MAMMOGRAM WITH CAD AND TOMO  COMPARISON:  Previous exam(s).  ACR Breast Density Category c: The breast tissue is heterogeneously dense, which may obscure small masses.  FINDINGS: Spot compression view of the outer left breast in the CC projection with tomography is negative for mass or distortion. There is dispersion of fibroglandular tissue. 90 degree lateral view of the left breast with tomography is negative.  Mammographic images were processed with CAD.  IMPRESSION: No evidence of malignancy in the left breast.  RECOMMENDATION: Screening mammogram in one year.(Code:[id number]-B-01Y)  I have discussed the findings and recommendations with the patient. Results were also provided in writing at the conclusion of the visit. If applicable, a reminder letter will be sent to the patient regarding the next appointment.  BI-RADS CATEGORY  1: Negative.  Electronically Signed   By: Curlene Dolphin M.D.   On: 11/04/2017 10:51  Complexity Note: Imaging results reviewed. Results shared with Ms. Sprenkle, using Layman's terms.                         Meds   Current Outpatient Medications:  .  acetaminophen (TYLENOL)  500 MG tablet, Take 1,000 mg by mouth every 8 (eight) hours as needed., Disp: , Rfl:  .  atenolol (TENORMIN) 50 MG tablet, Take 1 tablet (50 mg total) by mouth 2 (two) times daily., Disp: 180 tablet, Rfl: 3 .  cetirizine (ZYRTEC) 10 MG tablet, TAKE  1 TABLET (10 MG TOTAL) BY MOUTH DAILY., Disp: 90 tablet, Rfl: 3 .  Ciclopirox 1 % shampoo, LATHER TO SCALP 1-2 TIMES A WEEK. ALLOW TO SIT 5 MINUTES BEFORE RINSING., Disp: , Rfl: 1 .  clobetasol cream (TEMOVATE) 0.05 %, APPLY A THIN LAYER TO THE AFFECTED AREAS TWICE DAILY AS NEEDED FOR A MAX OF 4 WEEKS PER FLARE, Disp: , Rfl: 1 .  furosemide (LASIX) 40 MG tablet, Take 1 tablet (40 mg total) by mouth daily. (Patient taking differently: Take 40 mg by mouth daily as needed. ), Disp: 90 tablet, Rfl: 1 .  gabapentin (NEURONTIN) 800 MG tablet, TAKE 1 TABLET (800 MG TOTAL) BY MOUTH 4 (FOUR) TIMES DAILY., Disp: 360 tablet, Rfl: 3 .  hydroxychloroquine (PLAQUENIL) 200 MG tablet, Take 200 mg by mouth 2 (two) times daily.  , Disp: , Rfl:  .  hydrOXYzine (ATARAX/VISTARIL) 25 MG tablet, Take 0.5-1 tablets (12.5-25 mg total) by mouth at bedtime as needed (insomnia)., Disp: 30 tablet, Rfl: 2 .  lidocaine (LIDODERM) 5 %, Place 3 patches onto the skin daily. Remove & Discard patch within 12 hours or as directed by MD, Disp: 90 patch, Rfl: 1 .  methotrexate 2.5 MG tablet, Take by mouth once a week., Disp: , Rfl:  .  mometasone (ELOCON) 0.1 % ointment, Apply topically Twice daily as needed., Disp: , Rfl:  .  nitroGLYCERIN (NITROSTAT) 0.4 MG SL tablet, Place 1 tablet (0.4 mg total) under the tongue every 5 (five) minutes as needed for chest pain., Disp: 30 tablet, Rfl: 0 .  nortriptyline (PAMELOR) 25 MG capsule, Take 1 capsule (25 mg total) by mouth 3 (three) times daily., Disp: 270 capsule, Rfl: 3 .  pantoprazole (PROTONIX) 40 MG tablet, Take 1 tablet (40 mg total) by mouth daily., Disp: 90 tablet, Rfl: 3 .  potassium chloride SA (KLOR-CON M20) 20 MEQ tablet, Take 20 mEq by mouth daily., Disp: , Rfl:  .  traMADol (ULTRAM) 50 MG tablet, Take 1 tablet (50 mg total) by mouth every 6 (six) hours as needed., Disp: 120 tablet, Rfl: 2 .  triamterene-hydrochlorothiazide (MAXZIDE-25) 37.5-25 MG tablet, Take 1 tablet by mouth  daily., Disp: 90 tablet, Rfl: 3 .  predniSONE (DELTASONE) 5 MG tablet, Take 5 mg 1 tab daily, 30 days, Disp: , Rfl:   ROS  Constitutional: Denies any fever or chills Gastrointestinal: No reported hemesis, hematochezia, vomiting, or acute GI distress Musculoskeletal: Denies any acute onset joint swelling, redness, loss of ROM, or weakness Neurological: No reported episodes of acute onset apraxia, aphasia, dysarthria, agnosia, amnesia, paralysis, loss of coordination, or loss of consciousness  Allergies  Ms. Vallance is allergic to lisinopril.  [place]  Drug: Ms. Dress  reports that she does not use drugs. Alcohol:  reports that she does not drink alcohol. Tobacco:  reports that she has never smoked. She has never used smokeless tobacco. Medical:  has a past medical history of Allergy, Arthritis, Brain tumor ([place]) (05/2011), Cerebral aneurysm, nonruptured, Diabetes mellitus, Diffuse cystic mastopathy, Esophageal reflux, Hypertension, Hypopotassemia (06/01/2007), Intervertebral disc disorder with myelopathy, unspecified region, Lupus erythematosus, Migraines, Other convulsions, PLMD (periodic limb movement disorder), Presence of dental bridge, S/P bunionectomy (09/28/2013), and Unspecified sleep apnea. Surgical:  Ms. Usery  has a past surgical history that includes Dilation and curettage of uterus; Hemorrhoid surgery; Breast surgery; Foot surgery; Brain surgery (2013); lumbar surgery (2000); Brain AVM surgery; Tonsillectomy and adenoidectomy; Spine surgery; Hernia repair; Total abdominal hysterectomy (1979); Colonoscopy with propofol (N/A, 07/12/2016); polypectomy (07/12/2016); Breast biopsy (Bilateral); and Breast cyst aspiration (Bilateral). Family: family history includes Arthritis in her sister; Breast cancer in her paternal aunt; Cancer in her other; Cancer (age of onset: 49) in her mother; Coronary artery disease in her other; Crohn's disease in her sister; Diabetes in her daughter; Heart disease in her  mother; Heart failure in her mother; Hypertension in her mother and sister; Stroke in her mother.  Constitutional Exam  General appearance: Well nourished, well developed, and well hydrated. In no apparent acute distress Vitals:   12/08/17 0833  BP: (!) 133/92  Pulse: 62  Resp: 18  Temp: 97.7 F (36.5 C)  TempSrc: Oral  SpO2: 99%  Weight: 185 lb (83.9 kg)  Height: '5\' 2"'  (1.575 m)   BMI Assessment: Estimated body mass index is 33.84 kg/m as calculated from the following:   Height as of this encounter: '5\' 2"'  (1.575 m).   Weight as of this encounter: 185 lb (83.9 kg).  BMI interpretation table: BMI level Category Range association with higher incidence of chronic pain  <18 kg/m2 Underweight   18.5-24.9 kg/m2 Ideal body weight   25-29.9 kg/m2 Overweight Increased incidence by 20%  30-34.9 kg/m2 Obese (Class I) Increased incidence by 68%  35-39.9 kg/m2 Severe obesity (Class II) Increased incidence by 136%  >40 kg/m2 Extreme obesity (Class III) Increased incidence by 254%   Patient's current BMI Ideal Body weight  Body mass index is 33.84 kg/m. Ideal body weight: 50.1 kg (110 lb 7.2 oz) Adjusted ideal body weight: 63.6 kg (140 lb 4.3 oz)   BMI Readings from Last 4 Encounters:  12/08/17 33.84 kg/m  11/02/17 33.84 kg/m  10/25/17 33.84 kg/m  09/19/17 33.84 kg/m   Wt Readings from Last 4 Encounters:  12/08/17 185 lb (83.9 kg)  11/02/17 185 lb (83.9 kg)  10/25/17 185 lb (83.9 kg)  09/19/17 185 lb (83.9 kg)  Psych/Mental status: Alert, oriented x 3 (person, place, & time)       Eyes: PERLA Respiratory: No evidence of acute respiratory distress  Cervical Spine Area Exam  Skin & Axial Inspection: No masses, redness, edema, swelling, or associated skin lesions Alignment: Symmetrical Functional ROM: Unrestricted ROM      Stability: No instability detected Muscle Tone/Strength: Functionally intact. No obvious neuro-muscular anomalies detected. Sensory (Neurological):  Unimpaired Palpation: No palpable anomalies              Upper Extremity (UE) Exam    Side: Right upper extremity  Side: Left upper extremity  Skin & Extremity Inspection: Skin color, temperature, and hair growth are WNL. No peripheral edema or cyanosis. No masses, redness, swelling, asymmetry, or associated skin lesions. No contractures.  Skin & Extremity Inspection: Skin color, temperature, and hair growth are WNL. No peripheral edema or cyanosis. No masses, redness, swelling, asymmetry, or associated skin lesions. No contractures.  Functional ROM: Unrestricted ROM          Functional ROM: Unrestricted ROM          Muscle Tone/Strength: Functionally intact. No obvious neuro-muscular anomalies detected.  Muscle Tone/Strength: Functionally intact. No obvious neuro-muscular anomalies detected.  Sensory (Neurological): Unimpaired          Sensory (Neurological): Unimpaired  Palpation: No palpable anomalies              Palpation: No palpable anomalies              Provocative Test(s):  Phalen's test: deferred Tinel's test: deferred Apley's scratch test (touch opposite shoulder):  Action 1 (Across chest): deferred Action 2 (Overhead): deferred Action 3 (LB reach): deferred   Provocative Test(s):  Phalen's test: deferred Tinel's test: deferred Apley's scratch test (touch opposite shoulder):  Action 1 (Across chest): deferred Action 2 (Overhead): deferred Action 3 (LB reach): deferred    Thoracic Spine Area Exam  Skin & Axial Inspection: No masses, redness, or swelling Alignment: Symmetrical Functional ROM: Unrestricted ROM Stability: No instability detected Muscle Tone/Strength: Functionally intact. No obvious neuro-muscular anomalies detected. Sensory (Neurological): Unimpaired Muscle strength & Tone: No palpable anomalies  Lumbar Spine Area Exam  Skin & Axial Inspection: Well healed scar from previous spine surgery detected Alignment: Symmetrical Functional ROM: Decreased  ROM       Stability: No instability detected Muscle Tone/Strength: Functionally intact. No obvious neuro-muscular anomalies detected. Sensory (Neurological): Musculoskeletal pain pattern Palpation: No palpable anomalies       Provocative Tests: Hyperextension/rotation test: (+) bilaterally for facet joint pain. Lumbar quadrant test (Kemp's test): (+) bilaterally for facet joint pain. Lateral bending test: (+) due to pain. Patrick's Maneuver: deferred today                   FABER test: deferred today                   S-I anterior distraction/compression test: deferred today         S-I lateral compression test: deferred today         S-I Thigh-thrust test: deferred today         S-I Gaenslen's test: deferred today          Gait & Posture Assessment  Ambulation: Unassisted Gait: Relatively normal for age and body habitus Posture: WNL   Lower Extremity Exam    Side: Right lower extremity  Side: Left lower extremity  Stability: No instability observed          Stability: No instability observed          Skin & Extremity Inspection: Skin color, temperature, and hair growth are WNL. No peripheral edema or cyanosis. No masses, redness, swelling, asymmetry, or associated skin lesions. No contractures.  Skin & Extremity Inspection: Skin color, temperature, and hair growth are WNL. No peripheral edema or cyanosis. No masses, redness, swelling, asymmetry, or associated skin lesions. No contractures.  Functional ROM: Unrestricted ROM                  Functional ROM: Decreased ROM for hip and knee joints          Muscle Tone/Strength: Functionally intact. No obvious neuro-muscular anomalies detected.  Muscle Tone/Strength: L5 myotomal (EHL) weakness (Toe Dorsiflexion)  Sensory (Neurological): Unimpaired        Sensory (Neurological): Dermatomal pain pattern        DTR: Patellar: deferred today Achilles: deferred today Plantar: deferred today  DTR: Patellar: 1+: trace Achilles: 1+:  trace Plantar: deferred today  Palpation: No palpable anomalies  Palpation: No palpable anomalies   Assessment  Primary Diagnosis & Pertinent Problem List: The primary encounter diagnosis was Lumbar facet arthropathy. Diagnoses of Lumbar facet joint syndrome, Hx of lumbosacral spine surgery (Left L5/S1 foraminotomy), and Chronic pain syndrome were also pertinent to this  visit.  Status Diagnosis  Having a Flare-up Having a Flare-up Persistent 1. Lumbar facet arthropathy   2. Lumbar facet joint syndrome   3. Hx of lumbosacral spine surgery (Left L5/S1 foraminotomy)   4. Chronic pain syndrome     Problems updated and reviewed during this visit: Problem  Lumbar Facet Arthropathy  Lumbar Facet Joint Syndrome  Hx of lumbosacral spine surgery (Left L5/S1 foraminotomy)   General Recommendations: The pain condition that the patient suffers from is best treated with a multidisciplinary approach that involves an increase in physical activity to prevent de-conditioning and worsening of the pain cycle, as well as psychological counseling (formal and/or informal) to address the co-morbid psychological affects of pain. Treatment will often involve judicious use of pain medications and interventional procedures to decrease the pain, allowing the patient to participate in the physical activity that will ultimately produce long-lasting pain reductions. The goal of the multidisciplinary approach is to return the patient to a higher level of overall function and to restore their ability to perform activities of daily living.  She has had 2 previous lumbar spine surgeries including history of L5-S1 foraminotomy as well as left L5-S1 laminectomy.  Lanessa Shill Alkire has a history of greater than 3 months of moderate to severe pain which is resulted in functional impairment.  The patient has tried various conservative therapeutic options such as NSAIDs, Tylenol, muscle relaxants, physical therapy which was  inadequately effective.  Patient's pain is predominantly axial with physical exam findings suggestive of facet arthropathy.  Lumbar facet medial branch nerve blocks were discussed with the patient.  Risks and benefits were reviewed.  Patient would like to proceed with bilateral L3, L4, L5, S1 medial branch nerve block.  Plan:  bilateral L3, L4, L5, S1 medial branch nerve block.  For lumbar spondylosis and facet arthropathy.  Interventional history: Status post 3 epidural steroid injections with me at left L3/ L4 with the most recent one being 11/02/2017  Lab-work, procedure(s), and/or referral(s): Orders Placed This Encounter  Procedures  . LUMBAR FACET(MEDIAL BRANCH NERVE BLOCK) [facility]   Time Note: Greater than 50% of the 25 minute(s) of face-to-face time spent with Ms. Kostka, was spent in counseling/coordination of care regarding: Ms. Wentland primary cause of pain, the treatment plan, treatment alternatives, the risks and possible complications of proposed treatment, going over the informed consent, the results, interpretation and significance of  her recent diagnostic interventional treatment(s), realistic expectations and the goals of pain management (increased in functionality).   Provider-requested follow-up: Return for Procedure.  No future appointments.  Primary Care Physician: Wardell Honour, MD Location: [facility] Note by: Gillis Santa, M.D Date: 12/08/2017; Time: 10:19 AM  Patient Instructions   Facet Blocks Patient Information  Description: The facets are joints in the spine between the vertebrae.  Like any joints in the body, facets can become irritated and painful.  Arthritis can also effect the facets.  By injecting steroids and local anesthetic in and around these joints, we can temporarily block the nerve supply to them.  Steroids act directly on irritated nerves and tissues to reduce selling and inflammation which often leads to  decreased pain.  Facet blocks may be done anywhere along the spine from the neck to the low back depending upon the location of your pain.   After numbing the skin with local anesthetic (like Novocaine), a small needle is passed onto the facet joints under x-ray guidance.  You may experience a sensation of pressure  while this is being done.  The entire block usually lasts about 15-25 minutes.   Conditions which may be treated by facet blocks:   Low back/buttock pain  Neck/shoulder pain  Certain types of headaches  Preparation for the injection:  1. Do not eat any solid food or dairy products within 8 hours of your appointment. 2. You may drink clear liquid up to 3 hours before appointment.  Clear liquids include water, black coffee, juice or soda.  No milk or cream please. 3. You may take your regular medication, including pain medications, with a sip of water before your appointment.  Diabetics should hold regular insulin (if taken separately) and take 1/2 normal NPH dose the morning of the procedure.  Carry some sugar containing items with you to your appointment. 4. A driver must accompany you and be prepared to drive you home after your procedure. 5. Bring all your current medications with you. 6. An IV may be inserted and sedation may be given at the discretion of the physician. 7. A blood pressure cuff, EKG and other monitors will often be applied during the procedure.  Some patients may need to have extra oxygen administered for a short period. 8. You will be asked to provide medical information, including your allergies and medications, prior to the procedure.  We must know immediately if you are taking blood thinners (like Coumadin/Warfarin) or if you are allergic to IV iodine contrast (dye).  We must know if you could possible be pregnant.  Possible side-effects:   Bleeding from needle site  Infection (rare, may require surgery)  Nerve injury (rare)  Numbness & tingling  (temporary)  Difficulty urinating (rare, temporary)  Spinal headache (a headache worse with upright posture)  Light-headedness (temporary)  Pain at injection site (serveral days)  Decreased blood pressure (rare, temporary)  Weakness in arm/leg (temporary)  Pressure sensation in back/neck (temporary)   Call if you experience:   Fever/chills associated with headache or increased back/neck pain  Headache worsened by an upright position  New onset, weakness or numbness of an extremity below the injection site  Hives or difficulty breathing (go to the emergency room)  Inflammation or drainage at the injection site(s)  Severe back/neck pain greater than usual  New symptoms which are concerning to you  Please note:  Although the local anesthetic injected can often make your back or neck feel good for several hours after the injection, the pain will likely return. It takes 3-7 days for steroids to work.  You may not notice any pain relief for at least one week.  If effective, we will often do a series of 2-3 injections spaced 3-6 weeks apart to maximally decrease your pain.  After the initial series, you may be a candidate for a more permanent nerve block of the facets.  If you have any questions, please call #[phone]) [facility]  Moderate Conscious Sedation, Adult Sedation is the use of medicines to promote relaxation and relieve discomfort and anxiety. Moderate conscious sedation is a type of sedation. Under moderate conscious sedation, you are less alert than normal, but you are still able to respond to instructions, touch, or both. Moderate conscious sedation is used during short medical and dental procedures. It is milder than deep sedation, which is a type of sedation under which you cannot be easily woken up. It is also milder than general anesthesia, which is the use of medicines to make you unconscious. Moderate conscious sedation  allows you to return to your regular activities sooner. Tell a health care provider about:  Any allergies you have.  All medicines you are taking, including vitamins, herbs, eye drops, creams, and over-the-counter medicines.  Use of steroids (by mouth or creams).  Any problems you or family members have had with sedatives and anesthetic medicines.  Any blood disorders you have.  Any surgeries you have had.  Any medical conditions you have, such as sleep apnea.  Whether you are pregnant or may be pregnant.  Any use of cigarettes, alcohol, marijuana, or street drugs. What are the risks? Generally, this is a safe procedure. However, problems may occur, including:  Getting too much medicine (oversedation).  Nausea.  Allergic reaction to medicines.  Trouble breathing. If this happens, a breathing tube may be used to help with breathing. It will be removed when you are awake and breathing on your own.  Heart trouble.  Lung trouble.  What happens before the procedure? Staying hydrated Follow instructions from your health care provider about hydration, which may include:  Up to 2 hours before the procedure - you may continue to drink clear liquids, such as water, clear fruit juice, black coffee, and plain tea.  Eating and drinking restrictions Follow instructions from your health care provider about eating and drinking, which may include:  8 hours before the procedure - stop eating heavy meals or foods such as meat, fried foods, or fatty foods.  6 hours before the procedure - stop eating light meals or foods, such as toast or cereal.  6 hours before the procedure - stop drinking milk or drinks that contain milk.  2 hours before the procedure - stop drinking clear liquids.  Medicine  Ask your health care provider about:  Changing or stopping your regular medicines. This is especially important if you are taking diabetes medicines or blood thinners.  Taking medicines  such as aspirin and ibuprofen. These medicines can thin your blood. Do not take these medicines before your procedure if your health care provider instructs you not to.  Tests and exams  You will have a physical exam.  You may have blood tests done to show: ? How well your kidneys and liver are working. ? How well your blood can clot. General instructions  Plan to have someone take you home from the hospital or clinic.  If you will be going home right after the procedure, plan to have someone with you for 24 hours. What happens during the procedure?  An IV tube will be inserted into one of your veins.  Medicine to help you relax (sedative) will be given through the IV tube.  The medical or dental procedure will be performed. What happens after the procedure?  Your blood pressure, heart rate, breathing rate, and blood oxygen level will be monitored often until the medicines you were given have worn off.  Do not drive for 24 hours. This information is not intended to replace advice given to you by your health care provider. Make sure you discuss any questions you have with your health care provider. Document Released: 09/15/2000 Document Revised: 05/27/2015 Document Reviewed: 04/12/2015 Elsevier Interactive Patient Education  2018 [place]  What are the risk, side effects and possible complications? Generally speaking, most procedures are safe.  However, with any procedure there are risks, side effects, and the possibility of complications.  The risks and complications are dependent upon the sites that are lesioned, or the type  of nerve block to be performed.  The closer the procedure is to the spine, the more serious the risks are.  Great care is taken when placing the radio frequency needles, block needles or lesioning probes, but sometimes complications can occur. 1. Infection: Any time there is an injection through the skin, there is a risk  of infection.  This is why sterile conditions are used for these blocks.  There are four possible types of infection. 1. Localized skin infection. 2. Central Nervous System Infection-This can be in the form of Meningitis, which can be deadly. 3. Epidural Infections-This can be in the form of an epidural abscess, which can cause pressure inside of the spine, causing compression of the spinal cord with subsequent paralysis. This would require an emergency surgery to decompress, and there are no guarantees that the patient would recover from the paralysis. 4. Discitis-This is an infection of the intervertebral discs.  It occurs in about 1% of discography procedures.  It is difficult to treat and it may lead to surgery.        2. Pain: the needles have to go through skin and soft tissues, will cause soreness.       3. Damage to internal structures:  The nerves to be lesioned may be near blood vessels or    other nerves which can be potentially damaged.       4. Bleeding: Bleeding is more common if the patient is taking blood thinners such as  aspirin, Coumadin, Ticiid, Plavix, etc., or if he/she have some genetic predisposition  such as hemophilia. Bleeding into the spinal canal can cause compression of the spinal  cord with subsequent paralysis.  This would require an emergency surgery to  decompress and there are no guarantees that the patient would recover from the  paralysis.       5. Pneumothorax:  Puncturing of a lung is a possibility, every time a needle is introduced in  the area of the chest or upper back.  Pneumothorax refers to free air around the  collapsed lung(s), inside of the thoracic cavity (chest cavity).  Another two possible  complications related to a similar event would include: Hemothorax and Chylothorax.   These are variations of the Pneumothorax, where instead of air around the collapsed  lung(s), you may have blood or chyle, respectively.       6. Spinal headaches: They may occur  with any procedures in the area of the spine.       7. Persistent CSF (Cerebro-Spinal Fluid) leakage: This is a rare problem, but may occur  with prolonged intrathecal or epidural catheters either due to the formation of a fistulous  track or a dural tear.       8. Nerve damage: By working so close to the spinal cord, there is always a possibility of  nerve damage, which could be as serious as a permanent spinal cord injury with  paralysis.       9. Death:  Although rare, severe deadly allergic reactions known as "Anaphylactic  reaction" can occur to any of the medications used.      10. Worsening of the symptoms:  We can always make thing worse.  What are the chances of something like this happening? Chances of any of this occuring are extremely low.  By statistics, you have more of a chance of getting killed in a motor vehicle accident: while driving to the hospital than any of the above occurring .  Nevertheless,  you should be aware that they are possibilities.  In general, it is similar to taking a shower.  Everybody knows that you can slip, hit your head and get killed.  Does that mean that you should not shower again?  Nevertheless always keep in mind that statistics do not mean anything if you happen to be on the wrong side of them.  Even if a procedure has a 1 (one) in a 1,000,000 (million) chance of going wrong, it you happen to be that one..Also, keep in mind that by statistics, you have more of a chance of having something go wrong when taking medications.  Who should not have this procedure? If you are on a blood thinning medication (e.g. Coumadin, Plavix, see list of "Blood Thinners"), or if you have an active infection going on, you should not have the procedure.  If you are taking any blood thinners, please inform your physician.  How should I prepare for this procedure?  Do not eat or drink anything at least six hours prior to the procedure.  Bring a driver with you .  It cannot be a  taxi.  Come accompanied by an adult that can drive you back, and that is strong enough to help you if your legs get weak or numb from the local anesthetic.  Take all of your medicines the morning of the procedure with just enough water to swallow them.  If you have diabetes, make sure that you are scheduled to have your procedure done first thing in the morning, whenever possible.  If you have diabetes, take only half of your insulin dose and notify our nurse that you have done so as soon as you arrive at the clinic.  If you are diabetic, but only take blood sugar pills (oral hypoglycemic), then do not take them on the morning of your procedure.  You may take them after you have had the procedure.  Do not take aspirin or any aspirin-containing medications, at least eleven (11) days prior to the procedure.  They may prolong bleeding.  Wear loose fitting clothing that may be easy to take off and that you would not mind if it got stained with Betadine or blood.  Do not wear any jewelry or perfume  Remove any nail coloring.  It will interfere with some of our monitoring equipment.  NOTE: Remember that this is not meant to be interpreted as a complete list of all possible complications.  Unforeseen problems may occur.  BLOOD THINNERS The following drugs contain aspirin or other products, which can cause increased bleeding during surgery and should not be taken for 2 weeks prior to and 1 week after surgery.  If you should need take something for relief of minor pain, you may take acetaminophen which is found in Tylenol,m Datril, Anacin-3 and Panadol. It is not blood thinner. The products listed below are.  Do not take any of the products listed below in addition to any listed on your instruction sheet.  A.P.C or A.P.C with Codeine Codeine Phosphate Capsules #3 Ibuprofen Ridaura  ABC compound Congesprin Imuran rimadil  Advil Cope Indocin Robaxisal  Alka-Seltzer Effervescent Pain Reliever and  Antacid Coricidin or Coricidin-D  Indomethacin Rufen  Alka-Seltzer plus Cold Medicine Cosprin Ketoprofen S-A-C Tablets  Anacin Analgesic Tablets or Capsules Coumadin Korlgesic Salflex  Anacin Extra Strength Analgesic tablets or capsules CP-2 Tablets Lanoril Salicylate  Anaprox Cuprimine Capsules Levenox Salocol  Anexsia-D Dalteparin Magan Salsalate  Anodynos Darvon compound Magnesium Salicylate Sine-off  Ansaid  Dasin Capsules Magsal Sodium Salicylate  Anturane Depen Capsules Marnal Soma  APF Arthritis pain formula Dewitt's Pills Measurin Stanback  Argesic Dia-Gesic Meclofenamic Sulfinpyrazone  Arthritis Bayer Timed Release Aspirin Diclofenac Meclomen Sulindac  Arthritis pain formula Anacin Dicumarol Medipren Supac  Analgesic (Safety coated) Arthralgen Diffunasal Mefanamic Suprofen  Arthritis Strength Bufferin Dihydrocodeine Mepro Compound Suprol  Arthropan liquid Dopirydamole Methcarbomol with Aspirin Synalgos  ASA tablets/Enseals Disalcid Micrainin Tagament  Ascriptin Doan's Midol Talwin  Ascriptin A/D Dolene Mobidin Tanderil  Ascriptin Extra Strength Dolobid Moblgesic Ticlid  Ascriptin with Codeine Doloprin or Doloprin with Codeine Momentum Tolectin  Asperbuf Duoprin Mono-gesic Trendar  Aspergum Duradyne Motrin or Motrin IB Triminicin  Aspirin plain, buffered or enteric coated Durasal Myochrisine Trigesic  Aspirin Suppositories Easprin Nalfon Trillsate  Aspirin with Codeine Ecotrin Regular or Extra Strength Naprosyn Uracel  Atromid-S Efficin Naproxen Ursinus  Auranofin Capsules Elmiron Neocylate Vanquish  Axotal Emagrin Norgesic Verin  Azathioprine Empirin or Empirin with Codeine Normiflo Vitamin E  Azolid Emprazil Nuprin Voltaren  Bayer Aspirin plain, buffered or children's or timed BC Tablets or powders Encaprin Orgaran Warfarin Sodium  Buff-a-Comp Enoxaparin Orudis Zorpin  Buff-a-Comp with Codeine Equegesic Os-Cal-Gesic   Buffaprin Excedrin plain, buffered or Extra Strength  Oxalid   Bufferin Arthritis Strength Feldene Oxphenbutazone   Bufferin plain or Extra Strength Feldene Capsules Oxycodone with Aspirin   Bufferin with Codeine Fenoprofen Fenoprofen Pabalate or Pabalate-SF   Buffets II Flogesic Panagesic   Buffinol plain or Extra Strength Florinal or Florinal with Codeine Panwarfarin   Buf-Tabs Flurbiprofen Penicillamine   Butalbital Compound Four-way cold tablets Penicillin   Butazolidin Fragmin Pepto-Bismol   Carbenicillin Geminisyn Percodan   Carna Arthritis Reliever Geopen Persantine   Carprofen Gold's salt Persistin   Chloramphenicol Goody's Phenylbutazone   Chloromycetin Haltrain Piroxlcam   Clmetidine heparin Plaquenil   Cllnoril Hyco-pap Ponstel   Clofibrate Hydroxy chloroquine Propoxyphen         Before stopping any of these medications, be sure to consult the physician who ordered them.  Some, such as Coumadin (Warfarin) are ordered to prevent or treat serious conditions such as "deep thrombosis", "pumonary embolisms", and other heart problems.  The amount of time that you may need off of the medication may also vary with the medication and the reason for which you were taking it.  If you are taking any of these medications, please make sure you notify your pain physician before you undergo any procedures.

## 2017-12-13 ENCOUNTER — Encounter: Payer: 59 | Admitting: Student in an Organized Health Care Education/Training Program

## 2018-09-07 ENCOUNTER — Other Ambulatory Visit: Payer: Self-pay | Admitting: Family Medicine

## 2018-09-07 DIAGNOSIS — Z0000 Encounter for general adult medical examination without abnormal findings: Secondary | ICD-10-CM

## 2018-09-07 DIAGNOSIS — Z1231 Encounter for screening mammogram for malignant neoplasm of breast: Secondary | ICD-10-CM

## 2019-03-27 ENCOUNTER — Ambulatory Visit
Admission: RE | Admit: 2019-03-27 | Discharge: 2019-03-27 | Disposition: A | Discharge status: Home / Self Care | Payer: Medicare HMO | Source: Ambulatory Visit | Attending: Family Medicine | Admitting: Family Medicine

## 2019-03-27 DIAGNOSIS — Z1231 Encounter for screening mammogram for malignant neoplasm of breast: Secondary | ICD-10-CM

## 2020-02-20 ENCOUNTER — Other Ambulatory Visit: Payer: Self-pay | Admitting: Family Medicine

## 2020-02-20 DIAGNOSIS — E2839 Other primary ovarian failure: Secondary | ICD-10-CM

## 2020-02-20 DIAGNOSIS — Z1231 Encounter for screening mammogram for malignant neoplasm of breast: Secondary | ICD-10-CM

## 2020-02-25 ENCOUNTER — Telehealth: Payer: Self-pay | Admitting: Cardiovascular Disease

## 2020-02-25 NOTE — Telephone Encounter (Signed)
3 attempts to schedule fu appt from recall list.   Deleting recall.   

## 2020-04-09 ENCOUNTER — Other Ambulatory Visit: Payer: Self-pay | Admitting: Family Medicine

## 2020-04-09 ENCOUNTER — Other Ambulatory Visit: Payer: Self-pay

## 2020-04-09 ENCOUNTER — Ambulatory Visit
Admission: RE | Admit: 2020-04-09 | Discharge: 2020-04-09 | Disposition: A | Discharge status: Home / Self Care | Payer: Medicare HMO | Source: Ambulatory Visit | Attending: Family Medicine | Admitting: Family Medicine

## 2020-04-09 DIAGNOSIS — E2839 Other primary ovarian failure: Secondary | ICD-10-CM

## 2020-04-09 DIAGNOSIS — Z1231 Encounter for screening mammogram for malignant neoplasm of breast: Secondary | ICD-10-CM | POA: Insufficient documentation

## 2020-04-23 ENCOUNTER — Other Ambulatory Visit: Payer: Self-pay | Admitting: Family Medicine

## 2020-04-23 DIAGNOSIS — N6322 Unspecified lump in the left breast, upper inner quadrant: Secondary | ICD-10-CM

## 2020-05-01 ENCOUNTER — Other Ambulatory Visit: Payer: Self-pay

## 2020-05-01 ENCOUNTER — Ambulatory Visit
Admission: RE | Admit: 2020-05-01 | Discharge: 2020-05-01 | Disposition: A | Discharge status: Home / Self Care | Payer: Medicare HMO | Source: Ambulatory Visit | Attending: Family Medicine | Admitting: Family Medicine

## 2020-05-01 DIAGNOSIS — N6322 Unspecified lump in the left breast, upper inner quadrant: Secondary | ICD-10-CM

## 2020-12-15 DIAGNOSIS — M19042 Primary osteoarthritis, left hand: Secondary | ICD-10-CM | POA: Insufficient documentation

## 2021-04-13 ENCOUNTER — Other Ambulatory Visit: Payer: Self-pay | Admitting: Family Medicine

## 2021-04-13 DIAGNOSIS — Z1231 Encounter for screening mammogram for malignant neoplasm of breast: Secondary | ICD-10-CM

## 2021-04-16 ENCOUNTER — Other Ambulatory Visit: Payer: Self-pay | Admitting: Family Medicine

## 2021-04-16 DIAGNOSIS — N644 Mastodynia: Secondary | ICD-10-CM

## 2021-04-16 DIAGNOSIS — Z1231 Encounter for screening mammogram for malignant neoplasm of breast: Secondary | ICD-10-CM

## 2021-05-04 ENCOUNTER — Ambulatory Visit
Admission: RE | Admit: 2021-05-04 | Discharge: 2021-05-04 | Disposition: A | Discharge status: Home / Self Care | Payer: Medicare HMO | Source: Ambulatory Visit | Attending: Family Medicine | Admitting: Family Medicine

## 2021-05-04 DIAGNOSIS — N644 Mastodynia: Secondary | ICD-10-CM | POA: Insufficient documentation

## 2021-05-04 DIAGNOSIS — Z1231 Encounter for screening mammogram for malignant neoplasm of breast: Secondary | ICD-10-CM | POA: Insufficient documentation

## 2021-09-06 ENCOUNTER — Ambulatory Visit
Admission: EM | Admit: 2021-09-06 | Discharge: 2021-09-06 | Disposition: A | Discharge status: Home / Self Care | Payer: Medicare HMO | Attending: Urgent Care | Admitting: Urgent Care

## 2021-09-06 DIAGNOSIS — B029 Zoster without complications: Secondary | ICD-10-CM | POA: Diagnosis not present

## 2021-09-06 MED ORDER — VALACYCLOVIR HCL 1 G PO TABS: 1000.0000 mg | ORAL_TABLET | Freq: Three times a day (TID) | ORAL | 0 refills | Status: AC

## 2021-09-06 MED ORDER — HYDROCORTISONE 1 % EX CREA: TOPICAL_CREAM | CUTANEOUS | 0 refills | Status: DC

## 2021-09-06 NOTE — ED Provider Notes (Signed)
[facility]    CSN: [id number] Arrival date & time: 09/06/21  1105      History   Chief Complaint Chief Complaint  Patient presents with   Insect Bite         HPI Maureen Hodge is a 71 y.o. female.   Pleasant 71 year old female presents due to concerns of a red splotchy rash that she noted first last evening.  It is located on the right side of her back.  She notes a few lesions on her neck as well.  She is uncertain the cause.  She states they are burning and stinging in nature.  No treatments have been tried.     Past Medical History:  Diagnosis Date   Allergy    Arthritis    Brain tumor ([place]) 05/2011   s/p resection  [facility].   Cerebral aneurysm, nonruptured    Diabetes mellitus    Pre-diabetes   Diffuse cystic mastopathy    Esophageal reflux    Hypertension    Hypopotassemia 06/01/2007   Intervertebral disc disorder with myelopathy, unspecified region    Lupus erythematosus    Migraines    Other convulsions    PLMD (periodic limb movement disorder)    Presence of dental bridge    upper, permanent   S/P bunionectomy 09/28/2013   [place].  Right.   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   Lumbar canal stenosis 08/22/2012   Hypokalemia 10/25/2011   GERD (gastroesophageal reflux disease) 10/25/2011   Meningioma ([place]) 10/25/2011   Benign neoplasm of cerebral meninges ([place]) 06/01/2011   Obesity 06/01/2011   EDEMA 05/21/2009   HYPERTENSION, BENIGN 03/20/2009   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    Past Surgical History:  Procedure Laterality Date   BRAIN AVM REPAIR     BRAIN SURGERY  2013   BREAST BIOPSY Bilateral    neg   BREAST CYST ASPIRATION Bilateral    neg   BREAST EXCISIONAL BIOPSY     BREAST SURGERY     x 5 due to fibrocystic disease   COLONOSCOPY WITH PROPOFOL N/A 07/12/2016   Procedure: COLONOSCOPY WITH PROPOFOL;  Surgeon: Lucilla Lame, MD;  Location: [place];  Service: Endoscopy;  Laterality: N/A;  sleep apnea   DILATION AND CURETTAGE OF UTERUS     x 5   FOOT SURGERY     HEMORRHOID SURGERY     HERNIA REPAIR     lumbar surgery  2000   POLYPECTOMY  07/12/2016   Procedure: POLYPECTOMY;  Surgeon: Lucilla Lame, MD;  Location: [place];  Service: Endoscopy;;   [place]   postpartum hemorrhage; ovaries intact.    OB History   No obstetric history on file.      Home Medications    Prior to Admission medications   Medication Sig Start Date End Date Taking? Authorizing Provider  acetaminophen (TYLENOL) 500  MG tablet Take 1,000 mg by mouth every 8 (eight) hours as needed.   Yes [provider]  atenolol (TENORMIN) 50 MG tablet Take 1 tablet (50 mg total) by mouth 2 (two) times daily. 02/09/17  Yes Wardell Honour, MD  cetirizine (ZYRTEC) 10 MG tablet TAKE 1 TABLET (10 MG TOTAL) BY MOUTH DAILY. 03/14/17  Yes Wardell Honour, MD  Ciclopirox 1 % shampoo LATHER TO SCALP 1-2 TIMES A WEEK. ALLOW TO SIT 5 MINUTES BEFORE RINSING. 06/05/15  Yes [provider]  clobetasol cream (TEMOVATE) 0.05 % APPLY A THIN LAYER TO THE AFFECTED AREAS TWICE DAILY AS NEEDED FOR A MAX OF 4 WEEKS PER FLARE 06/13/15  Yes [provider]  furosemide (LASIX) 40 MG tablet Take 1 tablet (40 mg total) by mouth daily. Patient taking differently: Take 40 mg by mouth daily as needed. 02/09/17  Yes Wardell Honour, MD  gabapentin (NEURONTIN) 800 MG tablet  TAKE 1 TABLET (800 MG TOTAL) BY MOUTH 4 (FOUR) TIMES DAILY. 02/09/17  Yes Wardell Honour, MD  hydrocortisone cream 1 % Apply to affected area 2 times daily until resolved. Do not exceed 14 days of use 09/06/21  Yes Charrisse Masley L, PA  hydroxychloroquine (PLAQUENIL) 200 MG tablet Take 200 mg by mouth 2 (two) times daily.     Yes [provider]  hydrOXYzine (ATARAX/VISTARIL) 25 MG tablet Take 0.5-1 tablets (12.5-25 mg total) by mouth at bedtime as needed (insomnia). 02/09/17  Yes Wardell Honour, MD  lidocaine (LIDODERM) 5 % Place 3 patches onto the skin daily. Remove & Discard patch within 12 hours or as directed by MD 02/09/17  Yes Wardell Honour, MD  methotrexate 2.5 MG tablet Take by mouth once a week.   Yes [provider]  mometasone (ELOCON) 0.1 % ointment Apply topically Twice daily as needed. 11/09/11  Yes [provider]  nitroGLYCERIN (NITROSTAT) 0.4 MG SL tablet Place 1 tablet (0.4 mg total) under the tongue every 5 (five) minutes as needed for chest pain. 01/23/13  Yes Wardell Honour, MD  nortriptyline (PAMELOR) 25 MG capsule Take 1 capsule (25 mg total) by mouth 3 (three) times daily. 02/09/17  Yes Wardell Honour, MD  pantoprazole (PROTONIX) 40 MG tablet Take 1 tablet (40 mg total) by mouth daily. 02/09/17  Yes Wardell Honour, MD  predniSONE (DELTASONE) 5 MG tablet Take 5 mg 1 tab daily, 30 days 05/17/17  Yes [provider]  traMADol (ULTRAM) 50 MG tablet Take 1 tablet (50 mg total) by mouth every 6 (six) hours as needed. 06/15/17  Yes Wardell Honour, MD  triamterene-hydrochlorothiazide (MAXZIDE-25) 37.5-25 MG tablet Take 1 tablet by mouth daily. 02/09/17  Yes Wardell Honour, MD  valACYclovir (VALTREX) 1000 MG tablet Take 1 tablet (1,000 mg total) by mouth 3 (three) times daily for 7 days. 09/06/21 09/13/21 Yes Alvino Lechuga L, PA    Family History Family History  Problem Relation Age of Onset   Heart disease Mother        CHF   Heart failure Mother         CHF   Hypertension Mother    Stroke Mother    Cancer Mother 13       unknown primary   Hypertension Sister    Crohn's disease Sister    Arthritis Sister        partial knee replacement.   Diabetes Daughter    Coronary artery disease Other  family history   Cancer Other        family history   Breast cancer Paternal Aunt     Social History Social History   Tobacco Use   Smoking status: Never   Smokeless tobacco: Never  Vaping Use   Vaping Use: Never used  Substance Use Topics   Alcohol use: No    Alcohol/week: 0.0 standard drinks of alcohol   Drug use: No     Allergies   Lisinopril   Review of Systems Review of Systems As per HPI  Physical Exam Triage Vital Signs ED Triage Vitals  Enc Vitals Group     BP 09/06/21 1150 105/68     Pulse Rate 09/06/21 1150 (!) 58     Resp 09/06/21 1150 18     Temp 09/06/21 1150 98 F (36.7 C)     Temp Source 09/06/21 1150 Oral     SpO2 09/06/21 1150 99 %     Weight 09/06/21 1148 185 lb (83.9 kg)     Height 09/06/21 1148 '5\' 2"'$  (1.575 m)     Head Circumference --      Peak Flow --      Pain Score 09/06/21 1147 5     Pain Loc --      Pain Edu? --      Excl. in [place]? --    No data found.  Updated Vital Signs BP 105/68 (BP Location: Left Arm)   Pulse (!) 58   Temp 98 F (36.7 C) (Oral)   Resp 18   Ht '5\' 2"'$  (1.575 m)   Wt 185 lb (83.9 kg)   SpO2 99%   BMI 33.84 kg/m   Visual Acuity Right Eye Distance:   Left Eye Distance:   Bilateral Distance:    Right Eye Near:   Left Eye Near:    Bilateral Near:     Physical Exam Vitals and nursing note reviewed. Exam conducted with a chaperone present.  Constitutional:      General: She is not in acute distress.    Appearance: Normal appearance. She is obese. She is not ill-appearing, toxic-appearing or diaphoretic.  HENT:     Head: Normocephalic and atraumatic.     Right Ear: External ear normal.     Left Ear: External ear normal.  Musculoskeletal:      Cervical back: Normal range of motion and neck supple. No rigidity or tenderness.  Lymphadenopathy:     Cervical: No cervical adenopathy.  Skin:    General: Skin is warm.     Capillary Refill: Capillary refill takes less than 2 seconds.     Coloration: Skin is not jaundiced.     Findings: Rash (Patch of erythema to right thoracic region near T2.  Several vesicular lesions noted just lateral to that.) present. No bruising.  Neurological:     General: No focal deficit present.     Mental Status: She is alert and oriented to person, place, and time.      UC Treatments / Results  Labs (all labs ordered are listed, but only abnormal results are displayed) Labs Reviewed - No data to display  EKG   Radiology No results found.  Procedures Procedures (including critical care time)  Medications Ordered in UC Medications - No data to display  Initial Impression / Assessment and Plan / UC Course  I have reviewed the triage vital signs and the nursing notes.  Pertinent labs & imaging results that were available during my care  of the patient were reviewed by me and considered in my medical decision making (see chart for details).     Shingles -rash consistent with early shingles.  We will do topical hydrocortisone to help with the swelling and irritation.  P.o. Valtrex 3 times daily times 7 days.  Follow-up with PCP should symptoms change or persist.   Final Clinical Impressions(s) / UC Diagnoses   Final diagnoses:  Herpes zoster without complication     Discharge Instructions      Your rash is consistent with shingles.  Please take the antiviral medication 3 times daily until gone. Use the topical hydrocortisone prescribed today twice daily until gone, not to exceed 14 days of use. Please read the attached handout    ED Prescriptions     Medication Sig Dispense Auth. Provider   valACYclovir (VALTREX) 1000 MG tablet Take 1 tablet (1,000 mg total) by mouth 3 (three)  times daily for 7 days. 21 tablet Tatiyanna Lashley L, PA   hydrocortisone cream 1 % Apply to affected area 2 times daily until resolved. Do not exceed 14 days of use 15 g Hildreth Orsak L, PA      PDMP not reviewed this encounter.   Chaney Malling, [place] 09/06/21 1757

## 2021-09-06 NOTE — Discharge Instructions (Addendum)
Your rash is consistent with shingles.  Please take the antiviral medication 3 times daily until gone. Use the topical hydrocortisone prescribed today twice daily until gone, not to exceed 14 days of use. Please read the attached handout

## 2021-09-06 NOTE — ED Triage Notes (Signed)
Pt c/o possible bug bite to back of neck on 09/05/21.   Pt has red bumps along the right shoulder and a raised patch along the back of the neck.  Pt states that it itches and has not been painful today.

## 2021-11-05 ENCOUNTER — Other Ambulatory Visit: Payer: Self-pay

## 2021-11-05 ENCOUNTER — Ambulatory Visit
Admission: EM | Admit: 2021-11-05 | Discharge: 2021-11-05 | Disposition: A | Discharge status: Home / Self Care | Payer: Medicare HMO | Attending: Emergency Medicine | Admitting: Emergency Medicine

## 2021-11-05 DIAGNOSIS — J069 Acute upper respiratory infection, unspecified: Secondary | ICD-10-CM

## 2021-11-05 DIAGNOSIS — R062 Wheezing: Secondary | ICD-10-CM | POA: Diagnosis not present

## 2021-11-05 MED ORDER — AZITHROMYCIN 250 MG PO TABS: 250.0000 mg | ORAL_TABLET | Freq: Every day | ORAL | 0 refills | Status: AC

## 2021-11-05 MED ORDER — BENZONATATE 100 MG PO CAPS: 100.0000 mg | ORAL_CAPSULE | Freq: Three times a day (TID) | ORAL | 0 refills | Status: AC

## 2021-11-05 MED ORDER — PREDNISONE 20 MG PO TABS: 40.0000 mg | ORAL_TABLET | Freq: Every day | ORAL | 0 refills | Status: AC

## 2021-11-05 MED ORDER — PROMETHAZINE-DM 6.25-15 MG/5ML PO SYRP: 5.0000 mL | ORAL_SOLUTION | Freq: Every evening | ORAL | 0 refills | Status: AC | PRN

## 2021-11-05 NOTE — ED Triage Notes (Signed)
Cough x one week. Denies fever. Denies nasal congestion

## 2021-11-05 NOTE — Discharge Instructions (Signed)
Your wheezing throughout your lungs which is most likely a sign of irritation to the airways, have a low suspicion of pneumonia today  Begin use of azithromycin as directed, this will provide bacterial coverage  Begin use of prednisone every morning with food for 5 days, this medicine reduces inflammation and irritation to the airways which should resolve your wheezing  You may use benzonatate pill every 8 hours to help calm your coughing  You may use cough syrup at bedtime for additional comfort, be mindful this may Make you drowsy, during the day you may continue use of over-the-counter cough syrup that you have purchased You can take Tylenol and/or Ibuprofen as needed for fever reduction and pain relief.   For cough: honey 1/2 to 1 teaspoon (you can dilute the honey in water or another fluid).  You can also use guaifenesin and dextromethorphan for cough. You can use a humidifier for chest congestion and cough.  If you don't have a humidifier, you can sit in the bathroom with the hot shower running.      For sore throat: try warm salt water gargles, cepacol lozenges, throat spray, warm tea or water with lemon/honey, popsicles or ice, or OTC cold relief medicine for throat discomfort.   For congestion: take a daily anti-histamine like Zyrtec, Claritin, and a oral decongestant, such as pseudoephedrine.  You can also use Flonase 1-2 sprays in each nostril daily.   It is important to stay hydrated: drink plenty of fluids (water, gatorade/powerade/pedialyte, juices, or teas) to keep your throat moisturized and help further relieve irritation/discomfort.

## 2021-11-05 NOTE — ED Provider Notes (Signed)
[facility]    CSN: [id number] Arrival date & time: 11/05/21  1017      History   Chief Complaint Chief Complaint  Patient presents with   Cough    HPI Maureen Hodge is a 71 y.o. female.   Patient presents with chest congestion , productive cough exam for 7 days.  No known sick contacts.  Tolerating food and liquids.  Has attempted use of Alka-Seltzer cold and flu, Mucinex and Robitussin cough medicine without signs of improvement.  History of seasonal allergies, prediabetes, hypertension, lupus.  Denies shortness of breath, fever, chills, body aches, ear pain or sore throat.    Past Medical History:  Diagnosis Date   Allergy    Arthritis    Brain tumor ([place]) 05/2011   s/p resection  [facility].   Cerebral aneurysm, nonruptured    Diabetes mellitus    Pre-diabetes   Diffuse cystic mastopathy    Esophageal reflux    Hypertension    Hypopotassemia 06/01/2007   Intervertebral disc disorder with myelopathy, unspecified region    Lupus erythematosus    Migraines    Other convulsions    PLMD (periodic limb movement disorder)    Presence of dental bridge    upper, permanent   S/P bunionectomy 09/28/2013   [place].  Right.   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   Lumbar canal stenosis 08/22/2012   Hypokalemia 10/25/2011   GERD (gastroesophageal reflux disease) 10/25/2011   Meningioma ([place]) 10/25/2011   Benign neoplasm of cerebral meninges ([place]) 06/01/2011   Obesity 06/01/2011   EDEMA  05/21/2009   HYPERTENSION, BENIGN 03/20/2009   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    Past Surgical History:  Procedure Laterality Date   BRAIN AVM REPAIR     BRAIN SURGERY  2013   BREAST BIOPSY Bilateral    neg   BREAST CYST ASPIRATION Bilateral    neg   BREAST EXCISIONAL BIOPSY     BREAST SURGERY     x 5 due to fibrocystic disease   COLONOSCOPY WITH PROPOFOL N/A 07/12/2016   Procedure: COLONOSCOPY WITH PROPOFOL;  Surgeon: Lucilla Lame, MD;  Location: [place] [facility];  Service: Endoscopy;  Laterality: N/A;  sleep apnea   DILATION AND CURETTAGE OF UTERUS     x 5   FOOT SURGERY     HEMORRHOID SURGERY     HERNIA REPAIR     lumbar surgery  2000   POLYPECTOMY  07/12/2016   Procedure: POLYPECTOMY;  Surgeon: Lucilla Lame, MD;  Location: [facility];  Service: Endoscopy;;   [place]   postpartum hemorrhage; ovaries intact.    OB History   No obstetric history on file.      Home Medications    Prior to Admission medications   Medication Sig Start Date End Date Taking? Authorizing Provider  acetaminophen (TYLENOL) 500 MG tablet Take 1,000 mg by mouth  every 8 (eight) hours as needed.    [provider]  atenolol (TENORMIN) 50 MG tablet Take 1 tablet (50 mg total) by mouth 2 (two) times daily. 02/09/17   Wardell Honour, MD  cetirizine (ZYRTEC) 10 MG tablet TAKE 1 TABLET (10 MG TOTAL) BY MOUTH DAILY. 03/14/17   Wardell Honour, MD  Ciclopirox 1 % shampoo LATHER TO SCALP 1-2 TIMES A WEEK. ALLOW TO SIT 5 MINUTES BEFORE RINSING. 06/05/15   [provider]  clobetasol cream (TEMOVATE) 0.05 % APPLY A THIN LAYER TO THE AFFECTED AREAS TWICE DAILY AS NEEDED FOR A MAX OF 4 WEEKS PER FLARE 06/13/15   [provider]  furosemide (LASIX) 40 MG tablet Take 1 tablet (40 mg total) by mouth daily. Patient taking differently: Take 40 mg by mouth daily as needed. 02/09/17   Wardell Honour, MD  gabapentin (NEURONTIN) 800 MG tablet TAKE 1 TABLET (800 MG TOTAL) BY MOUTH 4 (FOUR) TIMES DAILY. 02/09/17   Wardell Honour, MD  hydrocortisone cream 1 % Apply to affected area 2 times daily until resolved. Do not exceed 14 days of use 09/06/21   Crain, Whitney L, PA  hydroxychloroquine (PLAQUENIL) 200 MG tablet Take 200 mg by mouth 2 (two) times daily.      [provider]  hydrOXYzine (ATARAX/VISTARIL) 25 MG tablet Take 0.5-1 tablets (12.5-25 mg total) by mouth at bedtime as needed (insomnia). 02/09/17   Wardell Honour, MD  lidocaine (LIDODERM) 5 % Place 3 patches onto the skin daily. Remove & Discard patch within 12 hours or as directed by MD 02/09/17   Wardell Honour, MD  methotrexate 2.5 MG tablet Take by mouth once a week.    [provider]  mometasone (ELOCON) 0.1 % ointment Apply topically Twice daily as needed. 11/09/11   [provider]  nitroGLYCERIN (NITROSTAT) 0.4 MG SL tablet Place 1 tablet (0.4 mg total) under the tongue every 5 (five) minutes as needed for chest pain. 01/23/13   Wardell Honour, MD  nortriptyline (PAMELOR) 25 MG capsule Take 1 capsule (25 mg total) by mouth 3 (three) times daily. 02/09/17   Wardell Honour, MD  pantoprazole (PROTONIX) 40 MG tablet Take 1 tablet (40 mg total) by mouth daily. 02/09/17   Wardell Honour, MD  predniSONE (DELTASONE) 5 MG tablet Take 5 mg 1 tab daily, 30 days 05/17/17   [provider]  traMADol (ULTRAM) 50 MG tablet Take 1 tablet (50 mg total) by mouth every 6 (six) hours as needed. 06/15/17   Wardell Honour, MD  triamterene-hydrochlorothiazide (MAXZIDE-25) 37.5-25 MG tablet Take 1 tablet by mouth daily. 02/09/17   Wardell Honour, MD    Family History Family History  Problem Relation Age of Onset   Heart disease Mother        CHF   Heart failure Mother        CHF   Hypertension Mother    Stroke Mother    Cancer Mother 71       unknown primary   Hypertension Sister    Crohn's disease Sister     Arthritis Sister        partial knee replacement.   Diabetes Daughter    Coronary artery disease Other        family history   Cancer Other        family history   Breast cancer Paternal Aunt     Social History Social History  Tobacco Use   Smoking status: Never   Smokeless tobacco: Never  Vaping Use   Vaping Use: Never used  Substance Use Topics   Alcohol use: No    Alcohol/week: 0.0 standard drinks of alcohol   Drug use: No     Allergies   Lisinopril   Review of Systems Review of Systems  Constitutional: Negative.   HENT: Negative.    Respiratory:  Positive for cough and wheezing. Negative for apnea, choking, chest tightness, shortness of breath and stridor.      Physical Exam Triage Vital Signs ED Triage Vitals  Enc Vitals Group     BP 11/05/21 1029 137/86     Pulse Rate 11/05/21 1029 (!) 59     Resp 11/05/21 1029 18     Temp 11/05/21 1029 98.1 F (36.7 C)     Temp Source 11/05/21 1029 Oral     SpO2 11/05/21 1029 100 %     Weight 11/05/21 1024 180 lb (81.6 kg)     Height 11/05/21 1024 '5\' 2"'$  (1.575 m)     Head Circumference --      Peak Flow --      Pain Score 11/05/21 1024 0     Pain Loc --      Pain Edu? --      Excl. in [place]? --    No data found.  Updated Vital Signs BP 137/86 (BP Location: Right Arm)   Pulse (!) 59   Temp 98.1 F (36.7 C) (Oral)   Resp 18   Ht '5\' 2"'$  (1.575 m)   Wt 180 lb (81.6 kg)   SpO2 100%   BMI 32.92 kg/m   Visual Acuity Right Eye Distance:   Left Eye Distance:   Bilateral Distance:    Right Eye Near:   Left Eye Near:    Bilateral Near:     Physical Exam Constitutional:      Appearance: Normal appearance.  HENT:     Head: Normocephalic.     Right Ear: Tympanic membrane, ear canal and external ear normal.     Left Ear: Tympanic membrane, ear canal and external ear normal.     Nose: Nose normal.     Mouth/Throat:     Mouth: Mucous membranes are moist.     Pharynx: Oropharynx is clear.  Eyes:      Extraocular Movements: Extraocular movements intact.  Cardiovascular:     Rate and Rhythm: Normal rate and regular rhythm.     Pulses: Normal pulses.     Heart sounds: Normal heart sounds.  Pulmonary:     Effort: Pulmonary effort is normal.     Breath sounds: Wheezing present.  Musculoskeletal:     Cervical back: Normal range of motion and neck supple.  Neurological:     Mental Status: She is alert and oriented to person, place, and time. Mental status is at baseline.      UC Treatments / Results  Labs (all labs ordered are listed, but only abnormal results are displayed) Labs Reviewed - No data to display  EKG   Radiology No results found.  Procedures Procedures (including critical care time)  Medications Ordered in UC Medications - No data to display  Initial Impression / Assessment and Plan / UC Course  I have reviewed the triage vital signs and the nursing notes.  Pertinent labs & imaging results that were available during my care of the patient were reviewed by me and considered in my medical decision  making (see chart for details).  Acute upper respiratory infection, wheezing  Patient is in no signs of distress nor toxic appearing.  Vital signs are stable.  Low suspicion for pneumonia therefore will defer imaging.  Due to timeline of illness will defer viral testing, home COVID test was negative.  On exam wheezing is heard to auscultation, 100% on room air.  Prescribed Z-Pak, prednisone, Tessalon and Promethazine DM for outpatient management. May use additional over-the-counter medications as needed for supportive care.  May follow-up with urgent care as needed if symptoms persist or worsen.    Final Clinical Impressions(s) / UC Diagnoses   Final diagnoses:  None   Discharge Instructions   None    ED Prescriptions   None    PDMP not reviewed this encounter.   Hans Eden, NP 11/05/21 1124

## 2022-01-14 DIAGNOSIS — R7303 Prediabetes: Secondary | ICD-10-CM | POA: Insufficient documentation

## 2022-04-12 ENCOUNTER — Encounter: Payer: Self-pay | Admitting: Family Medicine

## 2022-04-20 ENCOUNTER — Other Ambulatory Visit: Payer: Self-pay | Admitting: Student

## 2022-04-20 DIAGNOSIS — N6325 Unspecified lump in the left breast, overlapping quadrants: Secondary | ICD-10-CM

## 2022-04-30 ENCOUNTER — Other Ambulatory Visit: Payer: Medicare HMO

## 2022-05-07 ENCOUNTER — Ambulatory Visit
Admission: RE | Admit: 2022-05-07 | Discharge: 2022-05-07 | Disposition: A | Discharge status: Home / Self Care | Payer: Medicare HMO | Source: Ambulatory Visit | Attending: Student | Admitting: Student

## 2022-05-07 DIAGNOSIS — N6325 Unspecified lump in the left breast, overlapping quadrants: Secondary | ICD-10-CM | POA: Insufficient documentation

## 2023-04-21 ENCOUNTER — Ambulatory Visit
Admission: EM | Admit: 2023-04-21 | Discharge: 2023-04-21 | Disposition: A | Discharge status: Home / Self Care | Attending: Family Medicine | Admitting: Family Medicine

## 2023-04-21 ENCOUNTER — Ambulatory Visit (INDEPENDENT_AMBULATORY_CARE_PROVIDER_SITE_OTHER)

## 2023-04-21 DIAGNOSIS — A084 Viral intestinal infection, unspecified: Secondary | ICD-10-CM | POA: Diagnosis not present

## 2023-04-21 DIAGNOSIS — R0602 Shortness of breath: Secondary | ICD-10-CM | POA: Diagnosis not present

## 2023-04-21 MED ORDER — ONDANSETRON 4 MG PO TBDP: 4.0000 mg | ORAL_TABLET | Freq: Three times a day (TID) | ORAL | 0 refills | Status: AC | PRN

## 2023-04-21 MED ORDER — ALUM & MAG HYDROXIDE-SIMETH 200-200-20 MG/5ML PO SUSP
30.0000 mL | Freq: Once | ORAL | Status: AC
  Administered 2023-04-21: 30 mL via ORAL

## 2023-04-21 MED ORDER — ONDANSETRON 8 MG PO TBDP
8.0000 mg | ORAL_TABLET | Freq: Once | ORAL | Status: AC
  Administered 2023-04-21: 8 mg via ORAL

## 2023-04-21 MED ORDER — LIDOCAINE VISCOUS HCL 2 % MT SOLN
15.0000 mL | Freq: Once | OROMUCOSAL | Status: AC
  Administered 2023-04-21: 15 mL via OROMUCOSAL

## 2023-04-21 NOTE — ED Provider Notes (Addendum)
 [facility]  Note:  This document was prepared using Conservation officer, historic buildings and may include unintentional dictation errors.  MRN: [id number] DOB: 1950-10-11  Subjective:   Maureen Hodge is a 73 y.o. female presenting for weakness, nausea/vomiting, diarrhea since yesterday.  Patient also reports constant chest discomfort and shortness of breath since last night states that symptoms have slightly improved this morning but she feels chest pressure when she pushes on her anterior chest.  Patient had recent history of bronchitis approximately 3 weeks ago.  Has taken Alka-Seltzer which she threw up and antidiarrheal medication with minimal improvement.  No current facility-administered medications for this encounter.  Current Outpatient Medications:    mirtazapine (REMERON) 15 MG tablet, Take 1 tablet by mouth at bedtime., Disp: , Rfl:    ondansetron (ZOFRAN-ODT) 4 MG disintegrating tablet, Take 1 tablet (4 mg total) by mouth every 8 (eight) hours as needed for nausea or vomiting., Disp: 20 tablet, Rfl: 0   acetaminophen (TYLENOL) 500 MG tablet, Take 1,000 mg by mouth every 8 (eight) hours as needed., Disp: , Rfl:    atenolol (TENORMIN) 50 MG tablet, Take 1 tablet (50 mg total) by mouth 2 (two) times daily., Disp: 180 tablet, Rfl: 3   azithromycin (ZITHROMAX) 250 MG tablet, Take 1 tablet (250 mg total) by mouth daily. Take first 2 tablets together, then 1 every day until finished., Disp: 6 tablet, Rfl: 0   benzonatate (TESSALON) 100 MG capsule, Take 1 capsule (100 mg total) by mouth every 8 (eight) hours., Disp: 21 capsule, Rfl: 0   cetirizine (ZYRTEC) 10 MG tablet, TAKE 1 TABLET (10 MG TOTAL) BY MOUTH DAILY., Disp: 90 tablet, Rfl: 3   Ciclopirox 1 % shampoo, LATHER TO SCALP 1-2 TIMES A WEEK. ALLOW TO SIT 5 MINUTES BEFORE RINSING., Disp: , Rfl: 1   clobetasol cream (TEMOVATE) 0.05 %, APPLY A THIN LAYER TO THE AFFECTED AREAS TWICE DAILY AS NEEDED FOR A MAX OF 4 WEEKS PER FLARE,  Disp: , Rfl: 1   furosemide (LASIX) 40 MG tablet, Take 1 tablet (40 mg total) by mouth daily. (Patient taking differently: Take 40 mg by mouth daily as needed.), Disp: 90 tablet, Rfl: 1   gabapentin (NEURONTIN) 800 MG tablet, TAKE 1 TABLET (800 MG TOTAL) BY MOUTH 4 (FOUR) TIMES DAILY., Disp: 360 tablet, Rfl: 3   hydroxychloroquine (PLAQUENIL) 200 MG tablet, Take 200 mg by mouth 2 (two) times daily.  , Disp: , Rfl:    hydrOXYzine (ATARAX/VISTARIL) 25 MG tablet, Take 0.5-1 tablets (12.5-25 mg total) by mouth at bedtime as needed (insomnia)., Disp: 30 tablet, Rfl: 2   methotrexate 2.5 MG tablet, Take by mouth once a week., Disp: , Rfl:    nitroGLYCERIN (NITROSTAT) 0.4 MG SL tablet, Place 1 tablet (0.4 mg total) under the tongue every 5 (five) minutes as needed for chest pain., Disp: 30 tablet, Rfl: 0   nortriptyline (PAMELOR) 25 MG capsule, Take 1 capsule (25 mg total) by mouth 3 (three) times daily., Disp: 270 capsule, Rfl: 3   pantoprazole (PROTONIX) 40 MG tablet, Take 1 tablet (40 mg total) by mouth daily., Disp: 90 tablet, Rfl: 3   predniSONE (DELTASONE) 20 MG tablet, Take 2 tablets (40 mg total) by mouth daily., Disp: 10 tablet, Rfl: 0   promethazine-dextromethorphan (PROMETHAZINE-DM) 6.25-15 MG/5ML syrup, Take 5 mLs by mouth at bedtime as needed for cough., Disp: 118 mL, Rfl: 0   traMADol (ULTRAM) 50 MG tablet, Take 1 tablet (50 mg total) by mouth every  6 (six) hours as needed., Disp: 120 tablet, Rfl: 2   triamterene-hydrochlorothiazide (MAXZIDE-25) 37.5-25 MG tablet, Take 1 tablet by mouth daily., Disp: 90 tablet, Rfl: 3   Allergies  Allergen Reactions   Lisinopril Swelling    Face and lips    Past Medical History:  Diagnosis Date   Allergy    Arthritis    Brain tumor (HCC) 05/2011   s/p resection  [facility].   Cerebral aneurysm, nonruptured    Diabetes mellitus    Pre-diabetes   Diffuse cystic mastopathy    Esophageal reflux    Hypertension    Hypopotassemia 06/01/2007    Intervertebral disc disorder with myelopathy, unspecified region    Lupus erythematosus    Migraines    Other convulsions    PLMD (periodic limb movement disorder)    Presence of dental bridge    upper, permanent   S/P bunionectomy 09/28/2013   [facility].  Right.   Unspecified sleep apnea    CPAP titrated to 7cm     Past Surgical History:  Procedure Laterality Date   BRAIN AVM REPAIR     BRAIN SURGERY  2013   BREAST BIOPSY Bilateral    neg   BREAST CYST ASPIRATION Bilateral    neg   BREAST EXCISIONAL BIOPSY     BREAST SURGERY     x 5 due to fibrocystic disease   COLONOSCOPY WITH PROPOFOL N/A 07/12/2016   Procedure: COLONOSCOPY WITH PROPOFOL;  Surgeon: Midge Minium, MD;  Location: [facility];  Service: Endoscopy;  Laterality: N/A;  sleep apnea   DILATION AND CURETTAGE OF UTERUS     x 5   FOOT SURGERY     HEMORRHOID SURGERY     HERNIA REPAIR     lumbar surgery  2000   POLYPECTOMY  07/12/2016   Procedure: POLYPECTOMY;  Surgeon: Midge Minium, MD;  Location: [facility];  Service: Endoscopy;;   SPINE SURGERY     TONSILLECTOMY AND ADENOIDECTOMY     TOTAL ABDOMINAL HYSTERECTOMY  1979   postpartum hemorrhage; ovaries intact.    Family History  Problem Relation Age of Onset   Heart disease Mother        CHF   Heart failure Mother        CHF   Hypertension Mother    Stroke Mother    Cancer Mother 3       unknown primary   Hypertension Sister    Crohn's disease Sister    Arthritis Sister        partial knee replacement.   Diabetes Daughter    Coronary artery disease Other        family history   Cancer Other        family history   Breast cancer Paternal Aunt     Social History   Tobacco Use   Smoking status: Never   Smokeless tobacco: Never  Vaping Use   Vaping status: Never Used  Substance Use Topics   Alcohol use: No    Alcohol/week: 0.0 standard drinks of alcohol   Drug use: No    ROS Refer to HPI for ROS details.  Objective:    Vitals: BP 107/70 (BP Location: Right Arm)   Pulse 71   Resp 16   SpO2 97%   Physical Exam Vitals and nursing note reviewed.  Constitutional:      General: She is not in acute distress.    Appearance: Normal appearance. She is well-developed. She is not ill-appearing  or toxic-appearing.  HENT:     Head: Normocephalic and atraumatic.     Mouth/Throat:     Mouth: Mucous membranes are moist.     Pharynx: Oropharynx is clear. No oropharyngeal exudate or posterior oropharyngeal erythema.  Cardiovascular:     Rate and Rhythm: Normal rate and regular rhythm.     Heart sounds: No murmur heard. Pulmonary:     Effort: Pulmonary effort is normal. No respiratory distress.     Breath sounds: Normal breath sounds. No stridor. No decreased breath sounds, wheezing, rhonchi or rales.  Chest:     Chest wall: Tenderness (Midsternal discomfort with palpation of anterior chest, much improved after GI cocktail) present.  Abdominal:     General: There is no distension.     Tenderness: There is no abdominal tenderness. There is no right CVA tenderness, left CVA tenderness, guarding or rebound.  Skin:    General: Skin is warm and dry.  Neurological:     General: No focal deficit present.     Mental Status: She is alert and oriented to person, place, and time.  Psychiatric:        Mood and Affect: Mood normal.        Behavior: Behavior normal.     Procedures  No results found for this or any previous visit (from the past 24 hours).  Assessment and Plan :     Discharge Instructions      1. Shortness of breath (Primary) - ED EKG shows normal sinus rhythm, no sign of STEMI - DG Chest 2 View x-ray performed in UC is negative for any cardiopulmonary processes, no sign of consolidation or pneumonia.  2. Viral gastroenteritis - lidocaine (XYLOCAINE) 2 % viscous mouth solution 15 mL given in UC for gastritis - alum & mag hydroxide-simeth (MAALOX/MYLANTA) 200-200-20 MG/5ML suspension 30 mL  given in UC for gastritis - ondansetron (ZOFRAN-ODT) disintegrating tablet 8 mg given in UC for acute nausea secondary to viral gastroenteritis     Floreen Hunger Elwyn Hamper, [place] B, NP 04/21/23 1534    Emmanuel Hark B, NP 04/21/23 1536

## 2023-04-21 NOTE — ED Triage Notes (Addendum)
 Patient presents to UC for vomiting and diarrhea, weakness since yesterday. States she has had constant chest pain, SOB since last night. States today it has improved only worsens when she presses on her chest. Describes pain as if heaviness sensation. Had bronchitis 3 weeks ago. Took alka-seltzer, antidiarrheal meds with no relief.

## 2023-04-21 NOTE — ED Notes (Signed)
 Reports zofran helped a little.

## 2023-04-21 NOTE — Discharge Instructions (Signed)
 1. Shortness of breath (Primary) - ED EKG shows normal sinus rhythm, no sign of STEMI - DG Chest 2 View x-ray performed in UC is negative for any cardiopulmonary processes, no sign of consolidation or pneumonia.  2. Viral gastroenteritis - lidocaine (XYLOCAINE) 2 % viscous mouth solution 15 mL given in UC for gastritis - alum & mag hydroxide-simeth (MAALOX/MYLANTA) 200-200-20 MG/5ML suspension 30 mL given in UC for gastritis - ondansetron (ZOFRAN-ODT) disintegrating tablet 8 mg given in UC for acute nausea secondary to viral gastroenteritis

## 2023-05-31 ENCOUNTER — Encounter: Payer: Self-pay | Admitting: Family Medicine

## 2024-02-01 ENCOUNTER — Other Ambulatory Visit: Payer: Self-pay | Admitting: Family Medicine

## 2024-02-01 DIAGNOSIS — M79622 Pain in left upper arm: Secondary | ICD-10-CM

## 2024-02-08 ENCOUNTER — Other Ambulatory Visit

## 2024-02-08 ENCOUNTER — Encounter

## 2024-02-10 ENCOUNTER — Inpatient Hospital Stay: Admission: RE | Admit: 2024-02-10 | Payer: Self-pay

## 2024-02-10 DIAGNOSIS — M79622 Pain in left upper arm: Secondary | ICD-10-CM
